# Patient Record
Sex: Female | Born: 1985 | Race: White | Hispanic: No | Marital: Married | Smoking: Never smoker
Health system: Southern US, Community
[De-identification: ages and names within clinical notes are randomized; demographics above are authoritative.]

## PROBLEM LIST (undated history)

## (undated) DIAGNOSIS — M25569 Pain in unspecified knee: Secondary | ICD-10-CM

## (undated) DIAGNOSIS — G8929 Other chronic pain: Secondary | ICD-10-CM

## (undated) DIAGNOSIS — Z808 Family history of malignant neoplasm of other organs or systems: Secondary | ICD-10-CM

## (undated) HISTORY — DX: Other chronic pain: G89.29

## (undated) HISTORY — PX: WISDOM TOOTH EXTRACTION: SHX21

## (undated) HISTORY — DX: Family history of malignant neoplasm of other organs or systems: Z80.8

## (undated) HISTORY — DX: Pain in unspecified knee: M25.569

---

## 2006-06-16 ENCOUNTER — Emergency Department: Payer: Self-pay | Admitting: General Practice

## 2015-07-23 DIAGNOSIS — M419 Scoliosis, unspecified: Secondary | ICD-10-CM | POA: Insufficient documentation

## 2016-08-14 DIAGNOSIS — H5213 Myopia, bilateral: Secondary | ICD-10-CM | POA: Diagnosis not present

## 2016-09-11 ENCOUNTER — Encounter: Payer: Self-pay | Admitting: Osteopathic Medicine

## 2016-09-11 ENCOUNTER — Ambulatory Visit (INDEPENDENT_AMBULATORY_CARE_PROVIDER_SITE_OTHER): Payer: BLUE CROSS/BLUE SHIELD | Admitting: Osteopathic Medicine

## 2016-09-11 VITALS — BP 135/81 | HR 98 | Ht 67.0 in | Wt 148.0 lb

## 2016-09-11 DIAGNOSIS — Z712 Person consulting for explanation of examination or test findings: Secondary | ICD-10-CM

## 2016-09-11 DIAGNOSIS — Z7189 Other specified counseling: Secondary | ICD-10-CM | POA: Diagnosis not present

## 2016-09-11 DIAGNOSIS — M549 Dorsalgia, unspecified: Secondary | ICD-10-CM

## 2016-09-11 DIAGNOSIS — Z3041 Encounter for surveillance of contraceptive pills: Secondary | ICD-10-CM | POA: Diagnosis not present

## 2016-09-11 DIAGNOSIS — L723 Sebaceous cyst: Secondary | ICD-10-CM

## 2016-09-11 DIAGNOSIS — G8929 Other chronic pain: Secondary | ICD-10-CM

## 2016-09-11 MED ORDER — LEVONORGESTREL-ETHINYL ESTRAD 0.1-20 MG-MCG PO TABS: 1.0000 | ORAL_TABLET | Freq: Every day | ORAL | 3 refills | Status: DC

## 2016-09-11 NOTE — Progress Notes (Signed)
HPI: Brittney Mays is a 31 y.o. female  who presents to [facility] today, 09/11/16,  for chief complaint of:  Chief Complaint  Patient presents with  . Establish Care    Back pain: History of scoliosis, previously following at [facility] for orthopedics, integrative care/massage. New to the area. Back pain minimal today, is doing well keeping up with stretches/home exercises.  Lump on neck: Right side just under jawline, has noticed small bump on the skin, tried to squeeze/strain this once in scant flaky material emerged. No redness/pain, no difficulty swallowing.  Has some concerns about some biometric screening from work. Blood pressure was a bit low on one measurement, she states it was done using a portable wrist monitor. Had some questions also about sugar levels and cholesterol, A1c was never in prediabetic range that one fasting blood glucose was slightly over 100.  Requests refill on birth control. Has been on this method many years. Currently engaged and taking about getting pregnant within the next few years, would like to stay on pill   Past medical, surgical, social and family history reviewed: Patient Active Problem List   Diagnosis Date Noted  . Chronic bilateral back pain 09/12/2016   Past Surgical History:  Procedure Laterality Date  . WISDOM TOOTH EXTRACTION     Social History  Substance Use Topics  . Smoking status: Never Smoker  . Smokeless tobacco: Never Used  . Alcohol use Not on file   Family History  Problem Relation Age of Onset  . Hypertension Mother   . Cancer Sister   . Cancer Paternal Uncle   . Diabetes Paternal Uncle   . Depression Maternal Grandmother   . Diabetes Paternal Grandfather      Current medication list and allergy/intolerance information reviewed:   Current Outpatient Prescriptions  Medication Sig Dispense Refill  . levonorgestrel-ethinyl estradiol (LUTERA) 0.1-20 MG-MCG tablet TK 1 T PO QD     . ibuprofen (ADVIL,MOTRIN) 200 MG tablet Take by mouth.     No current facility-administered medications for this visit.    Allergies  Allergen Reactions  . Amoxicillin Hives      Review of Systems:  Constitutional:  No  fever, no chills, No recent illness, No unintentional weight changes. No significant fatigue.   HEENT: No  headache, no vision change, no hearing change, No sore throat, No  sinus pressure  Cardiac: No  chest pain, No  pressure, No palpitations, No  Orthopnea  Respiratory:  No  shortness of breath. No  Cough  Gastrointestinal: No  abdominal pain, No  nausea, No  vomiting,  No  blood in stool, No  diarrhea, No  constipation   Musculoskeletal: No new myalgia/arthralgia  Genitourinary: No  incontinence, No  abnormal genital bleeding, No abnormal genital discharge  Skin: No  Rash, No other wounds/concerning lesions  Hem/Onc: No  easy bruising/bleeding, No  abnormal lymph node  Endocrine: No cold intolerance,  No heat intolerance. No polyuria/polydipsia/polyphagia   Neurologic: No  weakness, No  dizziness, No  slurred speech/focal weakness/facial droop  Psychiatric: No  concerns with depression, No  concerns with anxiety, No sleep problems, No mood problems  Exam:  BP 135/81   Pulse 98   Ht 5\' 7"  (1.702 m)   Wt 148 lb (67.1 kg)   BMI 23.18 kg/m   Constitutional: VS see above. General Appearance: alert, well-developed, well-nourished, NAD  Eyes: Normal lids and conjunctive, non-icteric sclera  Ears, Nose, Mouth, Throat: MMM, Normal external  inspection ears/nares/mouth/lips/gums.  Neck: No masses, trachea midline. No thyroid enlargement. No tenderness/mass appreciated. No lymphadenopathy. See skin exam.   Respiratory: Normal respiratory effort. no wheeze, no rhonchi, no rales  Cardiovascular: S1/S2 normal, no murmur, no rub/gallop auscultated. RRR. No lower extremity edema. Pedal pulse II/IV bilaterally DP and PT. No carotid bruit or JVD. No abdominal  aortic bruit.  Gastrointestinal: Nontender, no masses. No hepatomegaly, no splenomegaly. No hernia appreciated. Bowel sounds normal. Rectal exam deferred.   Musculoskeletal: Gait normal. No clubbing/cyanosis of digits.   Neurological: Normal balance/coordination. No tremor. No cranial nerve deficit on limited exam. Motor and sensation intact and symmetric. Cerebellar reflexes intact.   Skin: warm, dry, intact. No rash/ulcer. No concerning nevi or subq nodules on limited exam. Area of concern on right jawline: Tiny subcutaneous papule/nodule, round, about 1-2 mm diameter, nontender, no overlying erythema or skin irregularity, hair follicle off center  Psychiatric: Normal judgment/insight. Normal mood and affect. Oriented x3.     X-ray thoracic spine AP and lateral5/26/2017 [place] Result Narrative  XR THORACIC SPINE AP AND LATERAL.  Clinical history: pain in thoracic spine, M54.6 Pain in thoracic spine, G89.29 Other chronic pain.  Comparison: None.  Findings and impression: The vertebral body heights, intervertebral disc spaces, and thoracic alignment are well maintained. The visualized paravertebral soft tissues are normal.  Electronically Signed VZ:CHYIFOYD Luiz Blare, MD Electronically Signed on:09/24/2015 9:35 AM      ASSESSMENT/PLAN:   Chronic bilateral back pain, unspecified back location - Follow-up as needed, continue current home exercises. - Plan: ibuprofen (ADVIL,MOTRIN) 200 MG tablet  Sebaceous cyst - Area of concern on right jawline appears benign, small sebaceous cysts. Offered to drain for confirmation versus watchful waiting, patient declines procedure  Encounter to discuss test results - Advised A1c is within normal range, possible inaccurate blood pressure reading with wrist monitor, no symptoms of low BP, reassurance   Encounter for surveillance of contraceptive pills - Plan: levonorgestrel-ethinyl estradiol (LUTERA) 0.1-20 MG-MCG  tablet    Patient Instructions  Plan:  Biometrics/labs at least every year or as directed by your employer. Always ask about any questions/concerns on the blood work! At this point, I have no worries!   Keep an eye on the lump on the skin, this looks like a small benign cyst but if it changes or enlarges, please come see me!   Birth control refill sent, let me know if any problems  For back or knee pain, feel free to come see me or you can also see one of our sports medicine physicians Dr. Darene Lamer or Dr. Georgina Snell.   Plan to come back in one year - you'll be due for Pap at that time. Come see me sooner if needed!     Visit summary with medication list and pertinent instructions was printed for patient to review. All questions at time of visit were answered - patient instructed to contact office with any additional concerns. ER/RTC precautions were reviewed with the patient. Follow-up plan: Return in about 1 year (around 09/11/2017) for [place] / PAP, sooner if needed.  Note: Total time spent 30 minutes, greater than 50% of the visit was spent face-to-face counseling and coordinating care for the following: The primary encounter diagnosis was Chronic bilateral back pain, unspecified back location. Diagnoses of Sebaceous cyst, Encounter to discuss test results, and Encounter for surveillance of contraceptive pills were also pertinent to this visit.Marland Kitchen

## 2016-09-11 NOTE — Patient Instructions (Signed)
Plan:  Biometrics/labs at least every year or as directed by your employer. Always ask about any questions/concerns on the blood work! At this point, I have no worries!   Keep an eye on the lump on the skin, this looks like a small benign cyst but if it changes or enlarges, please come see me!   Birth control refill sent, let me know if any problems  For back or knee pain, feel free to come see me or you can also see one of our sports medicine physicians Dr. Darene Lamer or Dr. Georgina Snell.   Plan to come back in one year - you'll be due for Pap at that time. Come see me sooner if needed!      Thank you for coming in today! You should receive an email asking you to complete a brief survey regarding your experience today at [facility]. Please take a moment to respond to this survey. Our goal is to serve you! Constructive criticism helps [place] to improve, and positive feedback helps our practice to shine, plus it makes [place] smile! Thanks in advance for your feedback.

## 2016-09-12 DIAGNOSIS — G8929 Other chronic pain: Secondary | ICD-10-CM | POA: Insufficient documentation

## 2016-09-12 DIAGNOSIS — M549 Dorsalgia, unspecified: Secondary | ICD-10-CM

## 2016-10-23 ENCOUNTER — Other Ambulatory Visit: Payer: Self-pay | Admitting: *Deleted

## 2016-10-23 DIAGNOSIS — Z3041 Encounter for surveillance of contraceptive pills: Secondary | ICD-10-CM

## 2016-10-25 ENCOUNTER — Other Ambulatory Visit: Payer: Self-pay

## 2016-10-25 DIAGNOSIS — Z3041 Encounter for surveillance of contraceptive pills: Secondary | ICD-10-CM

## 2016-10-25 MED ORDER — LEVONORGESTREL-ETHINYL ESTRAD 0.1-20 MG-MCG PO TABS: 1.0000 | ORAL_TABLET | Freq: Every day | ORAL | 3 refills | Status: DC

## 2017-01-26 ENCOUNTER — Telehealth: Payer: Self-pay | Admitting: Osteopathic Medicine

## 2017-01-26 NOTE — Telephone Encounter (Signed)
I Called the patient she stated that she is going to the [place] so she needs to know what shots sh will need. Farzana Koci,CMA

## 2017-01-26 NOTE — Telephone Encounter (Signed)
Patient is leaving the country around the middle of October. She is unsure of the vaccinations she will need. Please advise. Thanks!

## 2017-01-29 NOTE — Telephone Encounter (Signed)
For anyone traveling out of the country, I encourage them to review the CDC's travel website at http://www.gilbert-cooper.com/ - this should have updated information based on wherever the patient is going.   If they have any further needs or questions, they will need to schedule an appointment for travel consultation and vaccination administration. They can do this with our office, or occupational health downstairs I believe also provides travel consultation appointment for a flat fee plus vaccine costs, and they stock many of the vaccines that we do not - encourage patients to call their office at [phone] .

## 2017-01-29 NOTE — Telephone Encounter (Signed)
LEFT DETAILED MESSAGE ON PATIENT VM WITH THIS INFORMATION AS NOTED BELOW. Rhonda Cunningham,CMA

## 2017-07-10 ENCOUNTER — Telehealth: Payer: Self-pay

## 2017-07-10 NOTE — Telephone Encounter (Signed)
Can stop it whenever she'd like. If stopping early, period may come within a few days of stopping the pill pack.

## 2017-07-10 NOTE — Telephone Encounter (Signed)
At pt's request - left a full detail vm msg regarding provider's note.

## 2017-07-10 NOTE — Telephone Encounter (Signed)
As per pt - she would like to stop using oral contraceptives. She & her partner will be using barrier protection. Pt requesting feed back from provider - can she stop taking OCP once she is done with current pack & does she need to come in for a visit? Pls advise. Thanks.

## 2017-08-27 LAB — BASIC METABOLIC PANEL: Glucose: 84

## 2017-08-27 LAB — LIPID PANEL
CHOLESTEROL: 195 (ref 0–200)
HDL: 69 (ref 35–70)
LDL Cholesterol: 104
LDl/HDL Ratio: 2.8
Triglycerides: 110 (ref 40–160)

## 2017-08-27 LAB — HEMOGLOBIN A1C: HEMOGLOBIN A1C: 5

## 2017-12-24 ENCOUNTER — Encounter: Payer: Self-pay | Admitting: Sports Medicine

## 2017-12-24 ENCOUNTER — Ambulatory Visit (HOSPITAL_BASED_OUTPATIENT_CLINIC_OR_DEPARTMENT_OTHER)
Admission: RE | Admit: 2017-12-24 | Discharge: 2017-12-24 | Disposition: A | Discharge status: Home / Self Care | Payer: BLUE CROSS/BLUE SHIELD | Source: Ambulatory Visit | Attending: Sports Medicine | Admitting: Sports Medicine

## 2017-12-24 ENCOUNTER — Ambulatory Visit: Payer: BLUE CROSS/BLUE SHIELD | Admitting: Sports Medicine

## 2017-12-24 DIAGNOSIS — M26622 Arthralgia of left temporomandibular joint: Secondary | ICD-10-CM

## 2017-12-24 DIAGNOSIS — M26602 Left temporomandibular joint disorder, unspecified: Secondary | ICD-10-CM | POA: Diagnosis not present

## 2017-12-24 MED ORDER — MELOXICAM 15 MG PO TABS: ORAL_TABLET | ORAL | 3 refills | Status: DC

## 2017-12-24 NOTE — Patient Instructions (Signed)
Temporomandibular Joint Syndrome Temporomandibular joint (TMJ) syndrome is a condition that affects the joints between your jaw and your skull. The TMJs are located near your ears and allow your jaw to open and close. These joints and the nearby muscles are involved in all movements of the jaw. People with TMJ syndrome have pain in the area of these joints and muscles. Chewing, biting, or other movements of the jaw can be difficult or painful. TMJ syndrome can be caused by various things. In many cases, the condition is mild and goes away within a few weeks. For some people, the condition can become a long-term problem. What are the causes? Possible causes of TMJ syndrome include:  Grinding your teeth or clenching your jaw. Some people do this when they are under stress.  Arthritis.  Injury to the jaw.  Head or neck injury.  Teeth or dentures that are not aligned well.  In some cases, the cause of TMJ syndrome may not be known. What are the signs or symptoms? The most common symptom is an aching pain on the side of the head in the area of the TMJ. Other symptoms may include:  Pain when moving your jaw, such as when chewing or biting.  Being unable to open your jaw all the way.  Making a clicking sound when you open your mouth.  Headache.  Earache.  Neck or shoulder pain.  How is this diagnosed? Diagnosis can usually be made based on your symptoms, your medical history, and a physical exam. Your health care provider may check the range of motion of your jaw. Imaging tests, such as X-rays or an MRI, are sometimes done. You may need to see your dentist to determine if your teeth and jaw are lined up correctly. How is this treated? TMJ syndrome often goes away on its own. If treatment is needed, the options may include:  Eating soft foods and applying ice or heat.  Medicines to relieve pain or inflammation.  Medicines to relax the muscles.  A splint, bite plate, or mouthpiece  to prevent teeth grinding or jaw clenching.  Relaxation techniques or counseling to help reduce stress.  Transcutaneous electrical nerve stimulation (TENS). This helps to relieve pain by applying an electrical current through the skin.  Acupuncture. This is sometimes helpful to relieve pain.  Jaw surgery. This is rarely needed.  Follow these instructions at home:  Take medicines only as directed by your health care provider.  Eat a soft diet if you are having trouble chewing.  Apply ice to the painful area. ? Put ice in a plastic bag. ? Place a towel between your skin and the bag. ? Leave the ice on for 20 minutes, 2-3 times a day.  Apply a warm compress to the painful area as directed.  Massage your jaw area and perform any jaw stretching exercises as recommended by your health care provider.  If you were given a mouthpiece or bite plate, wear it as directed.  Avoid foods that require a lot of chewing. Do not chew gum.  Keep all follow-up visits as directed by your health care provider. This is important. Contact a health care provider if:  You are having trouble eating.  You have new or worsening symptoms. Get help right away if:  Your jaw locks open or closed. This information is not intended to replace advice given to you by your health care provider. Make sure you discuss any questions you have with your health care provider. Document   Released: 01/10/2001 Document Revised: 12/16/2015 Document Reviewed: 11/20/2013 Elsevier Interactive Patient Education  2018 Elsevier Inc.  

## 2017-12-24 NOTE — Assessment & Plan Note (Signed)
With masseter spasm as well. She will use heat, meloxicam. She will go to the pharmacy and get a mouthguard to wear at night. Jaw x-rays at [facility], x-ray is out of order here. Return to see me in 2 weeks, referral to oral and maxillofacial surgery if no better.

## 2017-12-24 NOTE — Progress Notes (Signed)
Subjective:    CC: Left jaw pain  HPI: For the past couple of days this pleasant 32 year old female has had pain that she localizes on her left jaw just anterior to her ear.  Worse when opening her mouth widely, or shifting it side to side, no trauma, no constitutional symptoms.  Pain is moderate, persistent.  I reviewed the past medical history, family history, social history, surgical history, and allergies today and no changes were needed.  Please see the problem list section below in epic for further details.  Past Medical History: History reviewed. No pertinent past medical history. Past Surgical History: Past Surgical History:  Procedure Laterality Date  . WISDOM TOOTH EXTRACTION     Social History: Social History   Socioeconomic History  . Marital status: Single    Spouse name: Not on file  . Number of children: Not on file  . Years of education: Not on file  . Highest education level: Not on file  Occupational History  . Not on file  Social Needs  . Financial resource strain: Not on file  . Food insecurity:    Worry: Not on file    Inability: Not on file  . Transportation needs:    Medical: Not on file    Non-medical: Not on file  Tobacco Use  . Smoking status: Never Smoker  . Smokeless tobacco: Never Used  Substance and Sexual Activity  . Alcohol use: Not on file  . Drug use: Not on file  . Sexual activity: Not on file  Lifestyle  . Physical activity:    Days per week: Not on file    Minutes per session: Not on file  . Stress: Not on file  Relationships  . Social connections:    Talks on phone: Not on file    Gets together: Not on file    Attends religious service: Not on file    Active member of club or organization: Not on file    Attends meetings of clubs or organizations: Not on file    Relationship status: Not on file  Other Topics Concern  . Not on file  Social History Narrative  . Not on file   Family History: Family History  Problem  Relation Age of Onset  . Hypertension Mother   . Cancer Sister   . Cancer Paternal Uncle   . Diabetes Paternal Uncle   . Depression Maternal Grandmother   . Diabetes Paternal Grandfather    Allergies: Allergies  Allergen Reactions  . Amoxicillin Hives   Medications: See med rec.  Review of Systems: No fevers, chills, night sweats, weight loss, chest pain, or shortness of breath.   Objective:    General: Well Developed, well nourished, and in no acute distress.  Neuro: Alert and oriented x3, extra-ocular muscles intact, sensation grossly intact.  HEENT: Normocephalic, atraumatic, pupils equal round reactive to light, neck supple, no masses, no lymphadenopathy, thyroid nonpalpable.  Tender to palpation over the left temporomandibular joint, reproduction of pain with left and right translation of the jaw.  Ear canals are unremarkable.  Oropharynx is otherwise unremarkable. Skin: Warm and dry, no rashes. Cardiac: Regular rate and rhythm, no murmurs rubs or gallops, no lower extremity edema.  Respiratory: Clear to auscultation bilaterally. Not using accessory muscles, speaking in full sentences.  Impression and Recommendations:    Arthralgia of left temporomandibular joint With masseter spasm as well. She will use heat, meloxicam. She will go to the pharmacy and get a mouthguard to wear  at night. Jaw x-rays at [facility], x-ray is out of order here. Return to see me in 2 weeks, referral to oral and maxillofacial surgery if no better.   ___________________________________________ Gwen Her. Dianah Field, M.D., ABFM., CAQSM. Primary Care and [place] Instructor of [employer] [facility] [employer]

## 2017-12-27 ENCOUNTER — Encounter: Payer: Self-pay | Admitting: Osteopathic Medicine

## 2017-12-27 NOTE — Progress Notes (Signed)
04

## 2018-01-08 ENCOUNTER — Ambulatory Visit: Payer: BLUE CROSS/BLUE SHIELD | Admitting: Sports Medicine

## 2018-01-28 ENCOUNTER — Ambulatory Visit: Payer: BLUE CROSS/BLUE SHIELD | Admitting: Osteopathic Medicine

## 2018-02-12 ENCOUNTER — Ambulatory Visit (INDEPENDENT_AMBULATORY_CARE_PROVIDER_SITE_OTHER): Payer: BLUE CROSS/BLUE SHIELD | Admitting: Osteopathic Medicine

## 2018-02-12 ENCOUNTER — Encounter: Payer: Self-pay | Admitting: Osteopathic Medicine

## 2018-02-12 ENCOUNTER — Ambulatory Visit: Payer: BLUE CROSS/BLUE SHIELD | Admitting: Osteopathic Medicine

## 2018-02-12 VITALS — BP 127/71 | HR 74 | Temp 98.3°F | Wt 146.8 lb

## 2018-02-12 DIAGNOSIS — G8929 Other chronic pain: Secondary | ICD-10-CM

## 2018-02-12 DIAGNOSIS — M549 Dorsalgia, unspecified: Secondary | ICD-10-CM

## 2018-02-12 DIAGNOSIS — F411 Generalized anxiety disorder: Secondary | ICD-10-CM

## 2018-02-12 NOTE — Patient Instructions (Addendum)
Escitalopram tablets What is this medicine? ESCITALOPRAM (es sye TAL oh pram) is used to treat depression and certain types of anxiety. This medicine may be used for other purposes; ask your health care provider or pharmacist if you have questions. COMMON BRAND NAME(S): Lexapro What should I tell my health care provider before I take this medicine? They need to know if you have any of these conditions: -bipolar disorder or a family history of bipolar disorder -diabetes -glaucoma -heart disease -kidney or liver disease -receiving electroconvulsive therapy -seizures (convulsions) -suicidal thoughts, plans, or attempt by you or a family member -an unusual or allergic reaction to escitalopram, the related drug citalopram, other medicines, foods, dyes, or preservatives -pregnant or trying to become pregnant -breast-feeding How should I use this medicine? Take this medicine by mouth with a glass of water. Follow the directions on the prescription label. You can take it with or without food. If it upsets your stomach, take it with food. Take your medicine at regular intervals. Do not take it more often than directed. Do not stop taking this medicine suddenly except upon the advice of your doctor. Stopping this medicine too quickly may cause serious side effects or your condition may worsen. A special MedGuide will be given to you by the pharmacist with each prescription and refill. Be sure to read this information carefully each time. Talk to your pediatrician regarding the use of this medicine in children. Special care may be needed. Overdosage: If you think you have taken too much of this medicine contact a poison control center or emergency room at once. NOTE: This medicine is only for you. Do not share this medicine with others. What if I miss a dose? If you miss a dose, take it as soon as you can. If it is almost time for your next dose, take only that dose. Do not take double or extra  doses.  What should I watch for while using this medicine? Tell your doctor if your symptoms do not get better or if they get worse. Visit your doctor or health care professional for regular checks on your progress. Because it may take several weeks to see the full effects of this medicine, it is important to continue your treatment as prescribed by your doctor. Patients and their families should watch out for new or worsening thoughts of suicide or depression. Also watch out for sudden changes in feelings such as feeling anxious, agitated, panicky, irritable, hostile, aggressive, impulsive, severely restless, overly excited and hyperactive, or not being able to sleep. If this happens, especially at the beginning of treatment or after a change in dose, call your health care professional. Dennis Bast may get drowsy or dizzy. Do not drive, use machinery, or do anything that needs mental alertness until you know how this medicine affects you. Do not stand or sit up quickly, especially if you are an older patient. This reduces the risk of dizzy or fainting spells. Alcohol may interfere with the effect of this medicine. Avoid alcoholic drinks. Your mouth may get dry. Chewing sugarless gum or sucking hard candy, and drinking plenty of water may help. Contact your doctor if the problem does not go away or is severe. What side effects may I notice from receiving this medicine? Side effects that you should report to your doctor or health care professional as soon as possible: -allergic reactions like skin rash, itching or hives, swelling of the face, lips, or tongue -anxious -black, tarry stools -changes in vision -confusion -elevated  mood, decreased need for sleep, racing thoughts, impulsive behavior -eye pain -fast, irregular heartbeat -feeling faint or lightheaded, falls -feeling agitated, angry, or irritable -hallucination, loss of contact with reality -loss of balance or coordination -loss of  memory -painful or prolonged erections -restlessness, pacing, inability to keep still -seizures -stiff muscles -suicidal thoughts or other mood changes -trouble sleeping -unusual bleeding or bruising -unusually weak or tired -vomiting Side effects that usually do not require medical attention (report to your doctor or health care professional if they continue or are bothersome): -changes in appetite -change in sex drive or performance -headache -increased sweating -indigestion, nausea -tremors This list may not describe all possible side effects. Call your doctor for medical advice about side effects. You may report side effects to FDA at 1-800-FDA-1088. Where should I keep my medicine? Keep out of reach of children. Store at room temperature between 15 and 30 degrees C (59 and 86 degrees F). Throw away any unused medicine after the expiration date. NOTE: This sheet is a summary. It may not cover all possible information. If you have questions about this medicine, talk to your doctor, pharmacist, or health care provider.     2018 Elsevier/Gold Standard (2015-09-20 13:20:23)

## 2018-02-12 NOTE — Progress Notes (Signed)
HPI: Brittney Mays is a 32 y.o. female who  has no past medical history on file.  she presents to [facility] today, 02/12/18,  for chief complaint of:  Anxiety  Reports she has always been a relatively anxious person but over the past couple of months is noticing more irritability and insomnia which is affecting her quality of life.  Work is stressful, she got married last year and still is adjusting a little bit.  Otherwise relationships are good, no major changes or other stressors.  Has some questions about medications/counseling, etc.  Also had some question about chronic back pain issues, she has tried physical therapy, massage, NSAID, home exercises, still coming and going.  No major injury lately or serious exacerbation lately   Depression screen [facility] 2/9 02/12/2018  Decreased Interest 0  Down, Depressed, Hopeless 0  PHQ - 2 Score 0  Altered sleeping 1  Tired, decreased energy 0  Change in appetite 0  Feeling bad or failure about yourself  0  Trouble concentrating 1  Moving slowly or fidgety/restless 0  Suicidal thoughts 0  PHQ-9 Score 2  Difficult doing work/chores Not difficult at all   GAD 7 : Generalized Anxiety Score 02/12/2018  Nervous, Anxious, on Edge 1  Control/stop worrying 1  Worry too much - different things 1  Trouble relaxing 1  Restless 0  Easily annoyed or irritable 1  Afraid - awful might happen 0  Total GAD 7 Score 5  Anxiety Difficulty Somewhat difficult        Past medical history, surgical history, and family history reviewed.  Current medication list and allergy/intolerance information reviewed.   (See remainder of HPI, ROS, Phys Exam below)  No results found.  No results found for this or any previous visit (from the past 72 hour(s)).   ASSESSMENT/PLAN:   Anxiety state - Extensive discussion on risks/benefits of medications including SSRIs/benzo, advised counseling.  Patient will think about  medicines,  she was advised to call or send [place] a my chart message in the next week/whenever she makes a decision.  I also sent a reminder in the chart to call if we have not heard from her in a week - Plan: Ambulatory referral to [facility] bilateral back pain, unspecified back location - Has tried physical therapy, has refills of anti-inflammatories, advised to follow-up with sports medicine as needed     Follow-up plan: Return for recheck if decide to start medicines - 4-6 weeks .            ############################################ ############################################ ############################################ ############################################    Outpatient Encounter Medications as of 02/12/2018  Medication Sig  . meloxicam (MOBIC) 15 MG tablet One tab PO qAM with breakfast for 2 weeks, then daily prn pain.  Marland Kitchen levonorgestrel-ethinyl estradiol (LUTERA) 0.1-20 MG-MCG tablet Take 1 tablet by mouth daily. (Patient not taking: Reported on 02/12/2018)   No facility-administered encounter medications on file as of 02/12/2018.    Allergies  Allergen Reactions  . Amoxicillin Hives      Review of Systems:  Constitutional: No recent illness  Cardiac: No  chest pain, No  pressure  Respiratory:  No  shortness of breath.   Gastrointestinal: No  abdominal pain, no change on bowel habits  Musculoskeletal: No new myalgia/arthralgia  Neurologic: No  weakness, No  Dizziness  Psychiatric: No  concerns with depression, No  concerns with anxiety  Exam:  BP 127/71 (BP Location: Left Arm, Patient Position: Sitting,  Cuff Size: Normal)   Pulse 74   Temp 98.3 F (36.8 C) (Oral)   Wt 146 lb 12.8 oz (66.6 kg)   BMI 22.99 kg/m   Constitutional: VS see above. General Appearance: alert, well-developed, well-nourished, NAD  Eyes: Normal lids and conjunctive, non-icteric sclera  Ears, Nose, Mouth, Throat: MMM, Normal external inspection  ears/nares/mouth/lips/gums.  Neck: No masses, trachea midline.   Respiratory: Normal respiratory effort.  Musculoskeletal: Gait normal. Symmetric and independent movement of all extremities  Neurological: Normal balance/coordination. No tremor.  Skin: warm, dry, intact.   Psychiatric: Normal judgment/insight. Normal mood and affect. Oriented x3. Tearful on interview when discussing   Visit summary with medication list and pertinent instructions was printed for patient to review, advised to alert [place] if any changes needed. All questions at time of visit were answered - patient instructed to contact office with any additional concerns. ER/RTC precautions were reviewed with the patient and understanding verbalized.   Follow-up plan: Return for recheck if decide to start medicines - 4-6 weeks .  Note: Total time spent 25 minutes, greater than 50% of the visit was spent face-to-face counseling and coordinating care for the following: The primary encounter diagnosis was Anxiety state. A diagnosis of Chronic bilateral back pain, unspecified back location was also pertinent to this visit.Marland Kitchen  Please note: voice recognition software was used to produce this document, and typos may escape review. Please contact Dr. Sheppard Coil for any needed clarifications.

## 2018-03-15 ENCOUNTER — Ambulatory Visit (INDEPENDENT_AMBULATORY_CARE_PROVIDER_SITE_OTHER): Payer: BLUE CROSS/BLUE SHIELD | Admitting: Licensed Clinical Social Worker

## 2018-03-15 DIAGNOSIS — F411 Generalized anxiety disorder: Secondary | ICD-10-CM | POA: Diagnosis not present
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
## 2018-04-03 ENCOUNTER — Ambulatory Visit (HOSPITAL_COMMUNITY): Payer: BLUE CROSS/BLUE SHIELD | Admitting: Licensed Clinical Social Worker

## 2018-04-04 ENCOUNTER — Ambulatory Visit (INDEPENDENT_AMBULATORY_CARE_PROVIDER_SITE_OTHER): Payer: BLUE CROSS/BLUE SHIELD | Admitting: Licensed Clinical Social Worker

## 2018-04-04 DIAGNOSIS — F411 Generalized anxiety disorder: Secondary | ICD-10-CM

## 2018-04-04 NOTE — Progress Notes (Addendum)
THERAPIST PROGRESS NOTE  Session Time: 2:05 PM to 3:00 PM  Participation Level: Active  Behavioral Response: CasualAlertEuthymic and and tearful in discussing specific topics  Type of Therapy: Individual Therapy  Treatment Goals addressed:  decrease anxiety, coping  Interventions: CBT, Solution Focused, Strength-based, Supportive and Other: coping  Summary: Brittney Mays is a 32 y.o. female who presents with haven't slept well and impacts her day, has to have wisdom teeth removed.  Therapist presented cognitive behavioral therapy model to explain how thoughts such as negative forecasting, over estimating negative outcome and under estimating their ability to cope, and behaviors such as lack of problem-solving skills perpetuate cycle, thinking and doing differently will help with anxiety.  Identified patient's worry about money as a type worry about the future  things that might happen or could happen, staying more in present, living life is a daily process helps to stay out thoughts that are future oriented.  Discussed being able to tolerate and accept uncertainty about things helps with coping as it is a part of life, that we all have to learn to cope with, letting go of the need to control things, recognizing what is and what is not in our control. Being able to recognize there is not absolute certainty in our choices and only can make the best choice possible.  Introduced worksheet "ruminating problem-solving that has gone wrong" to explain that thinking through things in her head can lead to not taking action steps, the thinking process begins to not be helpful problem solving skills which involve action, discussed limiting time to think of a solution, and most effective strategy is then to take action and adjust as needed with plan.  Discussed distraction is helpful for anxiety and patient became tearful relating she does not have any hobbies, reminiscing on long periods of time where  she did not have the time for it area discussed this as very important in her treatment plan as well for overall well-being in life and patient plans to join class for ornithology.    Therapist to assess patient current functioning per report.  Introduced CBT model to help patient understand how thoughts and behaviors create anxiety and how they keep cycle of anxiety going.  Discussed how thinking differently, not negatively projecting into the future, excepting uncertainty in life, doing differently such as problem-solving, mindfulness and living in present moment are helpful.  Worked on building insight that anxiety is not helpful it is an attempt to solve problems but does not do anything to solve the problem, leads to negative emotion and more helpful to be active and problem-solving it is more effective. Discussed another strategy is to concentrate on one day at a time, because that is all were guaranteed in life.  Worrying does not change a thing, think about life as a daily process and not a future of projected possibilities that may happen it will help her cope.  One day at a time, one problem at a time, one solution at a time then one stays in the present and able to enjoy but is happening in the present.  Discussed rumination as part of patient's anxiety and thinking about the problem and not acting on it related to work where she feels paralyzed, related a more helpful strategy is to spend a little time and thinking about problem-solving but important to get herself into action, she can adjust as she goes along. Discussed distraction is helpful for patient help her get away from rumination  and anxiety, discovered patient does not have any hobbies discussed her getting involved in class about learning about birds is positive step in part of helpful and working on treatment goals.  Provided strength based and supportive intervention Suicidal/Homicidal: No  Plan: Return again in 2 weeks.2.  Therapist  continue to work with patient on strategies to decrease to anxiety. 3.  Patient to sign up for class on or nephrology to engage in activities as hobbies and when she will enjoy  Diagnosis: Axis I:  generalized anxiety disorder    Axis II: No diagnosis    Cordella Register, LCSW 04/04/2018

## 2018-04-17 ENCOUNTER — Ambulatory Visit (INDEPENDENT_AMBULATORY_CARE_PROVIDER_SITE_OTHER): Payer: BLUE CROSS/BLUE SHIELD | Admitting: Licensed Clinical Social Worker

## 2018-04-17 DIAGNOSIS — F411 Generalized anxiety disorder: Secondary | ICD-10-CM | POA: Diagnosis not present

## 2018-04-17 NOTE — Progress Notes (Signed)
THERAPIST PROGRESS NOTE  Session Time: 3:02 PM to 4:00 PM  Participation Level: Active  Behavioral Response: CasualAlertEuthymic  Type of Therapy: Individual Therapy  Treatment Goals addressed:  decrease anxiety, coping  Interventions: CBT, Solution Focused, Strength-based, Supportive, Reframing and Other: relaxation-deep breathing, coping  Summary: Brittney Mays is a 32 y.o. female who check in and relates had wisdom teeth removed right after last session and since last time mostly dealing with this, still dealing with cheek torn up, still bleeding in mouth, being wiped out for awhile afterwards. What happened is it pushed her behind on things at work. Her plan had been that work would become less busy and it hasn't turned out that way because of losing time and influx of clients. Also this combined with holidays and getting together with family. Shares if somebody was watching her at work they would see her mind going a mile a minute, she would start to do something and then do something else, going in three different directions. Reviewed worksheet "Generalized Anxiety Disorder" patient related able to prioritize and get test done but is very stressed out and tense doing these tasks. 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 Relates some days there is pressure because of the volume and the need to keep mom and baby safe. Other days feels pressure because of her own expectation or other's expectation on herself. Acknowledges setting high standards, perfectionism and having been that way for awhile.and didn't get the break.discussed perfectionist thinking is distorted as nothing is perfect, can interfere with more effective functioning.  Reviewed strategies to help with anxiety, introduced deep breathing, reviewed activities she does to relax and  discussed relaxation as foundation for addressing anxiety. She walks dog outside and has a yoga class once a week. Discussed finding and doing relaxation strategy every day.  Provided positive feedback with patient saying thinking about what her expectations would be for someone else and then applying to her self as a way to challenge perfectionism, therapist also encouraged her just that she would be a friend to be a good support herself instead of being critical and demanding, this helps with coping.  Patient shares she about a book on generalized anxiety disorder and therapist related this will be helpful making progress and developing coping strategies for anxiety.  Shares holidays will be different for her this year, will be with family but in-laws, relates father in-law is paralyzed from chest down, in-laws have not coped well with this so not sure what they are walking into.   Therapist assessed patient current functioning per report.  Reviewed worksheet "generalized anxiety" to explore how patient specifically experiences anxiety in different situations.  Identified patient can engage in problem-solving but anxiety remains very high.  Identified patient is perfectionistic and discussed that perfectionism is unrealistic and unhelpful, work in therapy on ways to talk to herself that will be more helpful to help not increase anxiety.  Discussed treatment to include being less focused on uncertainty and more present focus, relaxation techniques, techniques for dealing with unhelpful beliefs and pointed out for example perfectionist thinking.  Identified patient's job is stressful but also discussed finding ways to do job more effectively with less anxiety.  Identified relaxation as important for step in managing anxiety, identified two activities she can do a week for relaxation also  encouraged her in daily relaxation activities.  Discussed finding the right one for patient, introduced deep breathing and  provided patient with a worksheet to learn more into detail and instructions for focused breathing.  Reviewed stressors for upcoming holiday, reframes and ways to see where she may have some positive experiences.  Provided strength based and supportive intervention  Suicidal/Homicidal: No  Plan: Return again in 2 weeks.2.  Purpose continue to work with patient on strategies to help decrease anxiety. 3.  Patient to read handout on "focused breathing" and begin bulk she has found tied will "generalized anxiety disorder"  Diagnosis: Axis I:   generalized anxiety disorder    Axis II: No diagnosis    Cordella Register, LCSW 04/17/2018

## 2018-05-06 ENCOUNTER — Ambulatory Visit (INDEPENDENT_AMBULATORY_CARE_PROVIDER_SITE_OTHER): Payer: BLUE CROSS/BLUE SHIELD | Admitting: Family Medicine

## 2018-05-06 VITALS — BP 125/84 | HR 103 | Temp 101.2°F | Ht 67.0 in | Wt 153.0 lb

## 2018-05-06 DIAGNOSIS — R059 Cough, unspecified: Secondary | ICD-10-CM

## 2018-05-06 DIAGNOSIS — J101 Influenza due to other identified influenza virus with other respiratory manifestations: Secondary | ICD-10-CM

## 2018-05-06 DIAGNOSIS — R05 Cough: Secondary | ICD-10-CM

## 2018-05-06 LAB — POCT INFLUENZA A/B
Influenza A, POC: NEGATIVE
Influenza B, POC: POSITIVE — AB

## 2018-05-06 LAB — POCT RAPID STREP A (OFFICE): RAPID STREP A SCREEN: NEGATIVE

## 2018-05-06 MED ORDER — BENZONATATE 200 MG PO CAPS: 200.0000 mg | ORAL_CAPSULE | Freq: Three times a day (TID) | ORAL | 0 refills | Status: DC | PRN

## 2018-05-06 NOTE — Patient Instructions (Addendum)
Thank you for coming in today.  Please maximize the over the counter medicine.  The maximum dose of acetaminophen is 1000 mg every 6 hours. The maximum dose of ibuprofen is 800 mg every 8 hours.  Make sure to look at the ingredients of the medications you take to not take too much of either 1 of these medicines but you can certainly increase what you are taking now.  If you start having a bad cough fill and start taking the tessalon pearles for cough. The medicine will not make you sleepy.  If not controlled I can prescribe codeine based cough medicine.   If sore throat continues after you stop having a fever and feel better we can use prednisone. This is a strong antiinflammatory medicine.   Call or go to the emergency room if you get worse, have trouble breathing, have chest pains, or palpitations.    Influenza, Adult Influenza, more commonly known as "the flu," is a viral infection that mainly affects the respiratory tract. The respiratory tract includes organs that help you breathe, such as the lungs, nose, and throat. The flu causes many symptoms similar to the common cold along with high fever and body aches. The flu spreads easily from person to person (is contagious). Getting a flu shot (influenza vaccination) every year is the best way to prevent the flu. What are the causes? This condition is caused by the influenza virus. You can get the virus by:  Breathing in droplets that are in the air from an infected person's cough or sneeze.  Touching something that has been exposed to the virus (has been contaminated) and then touching your mouth, nose, or eyes. What increases the risk? The following factors may make you more likely to get the flu:  Not washing or sanitizing your hands often.  Having close contact with many people during cold and flu season.  Touching your mouth, eyes, or nose without first washing or sanitizing your hands.  Not getting a yearly (annual) flu  shot. You may have a higher risk for the flu, including serious problems such as a lung infection (pneumonia), if you:  Are older than 65.  Are pregnant.  Have a weakened disease-fighting system (immune system). You may have a weakened immune system if you: ? Have HIV or AIDS. ? Are undergoing chemotherapy. ? Are taking medicines that reduce (suppress) the activity of your immune system.  Have a long-term (chronic) illness, such as heart disease, kidney disease, diabetes, or lung disease.  Have a liver disorder.  Are severely overweight (morbidly obese).  Have anemia. This is a condition that affects your red blood cells.  Have asthma. What are the signs or symptoms? Symptoms of this condition usually begin suddenly and last 4-14 days. They may include:  Fever and chills.  Headaches, body aches, or muscle aches.  Sore throat.  Cough.  Runny or stuffy (congested) nose.  Chest discomfort.  Poor appetite.  Weakness or fatigue.  Dizziness.  Nausea or vomiting. How is this diagnosed? This condition may be diagnosed based on:  Your symptoms and medical history.  A physical exam.  Swabbing your nose or throat and testing the fluid for the influenza virus. How is this treated? If the flu is diagnosed early, you can be treated with medicine that can help reduce how severe the illness is and how long it lasts (antiviral medicine). This may be given by mouth (orally) or through an IV. Taking care of yourself at home can  help relieve symptoms. Your health care provider may recommend:  Taking over-the-counter medicines.  Drinking plenty of fluids. In many cases, the flu goes away on its own. If you have severe symptoms or complications, you may be treated in a hospital. Follow these instructions at home: Activity  Rest as needed and get plenty of sleep.  Stay home from work or school as told by your health care provider. Unless you are visiting your health care  provider, avoid leaving home until your fever has been gone for 24 hours without taking medicine. Eating and drinking  Take an oral rehydration solution (ORS). This is a drink that is sold at pharmacies and retail stores.  Drink enough fluid to keep your urine pale yellow.  Drink clear fluids in small amounts as you are able. Clear fluids include water, ice chips, diluted fruit juice, and low-calorie sports drinks.  Eat bland, easy-to-digest foods in small amounts as you are able. These foods include bananas, applesauce, rice, lean meats, toast, and crackers.  Avoid drinking fluids that contain a lot of sugar or caffeine, such as energy drinks, regular sports drinks, and soda.  Avoid alcohol.  Avoid spicy or fatty foods. General instructions      Take over-the-counter and prescription medicines only as told by your health care provider.  Use a cool mist humidifier to add humidity to the air in your home. This can make it easier to breathe.  Cover your mouth and nose when you cough or sneeze.  Wash your hands with soap and water often, especially after you cough or sneeze. If soap and water are not available, use alcohol-based hand sanitizer.  Keep all follow-up visits as told by your health care provider. This is important. How is this prevented?   Get an annual flu shot. You may get the flu shot in late summer, fall, or winter. Ask your health care provider when you should get your flu shot.  Avoid contact with people who are sick during cold and flu season. This is generally fall and winter. Contact a health care provider if:  You develop new symptoms.  You have: ? Chest pain. ? Diarrhea. ? A fever.  Your cough gets worse.  You produce more mucus.  You feel nauseous or you vomit. Get help right away if:  You develop shortness of breath or difficulty breathing.  Your skin or nails turn a bluish color.  You have severe pain or stiffness in your neck.  You  develop a sudden headache or sudden pain in your face or ear.  You cannot eat or drink without vomiting. Summary  Influenza, more commonly known as "the flu," is a viral infection that primarily affects your respiratory tract.  Symptoms of the flu usually begin suddenly and last 4-14 days.  Getting an annual flu shot is the best way to prevent getting the flu.  Stay home from work or school as told by your health care provider. Unless you are visiting your health care provider, avoid leaving home until your fever has been gone for 24 hours without taking medicine.  Keep all follow-up visits as told by your health care provider. This is important. This information is not intended to replace advice given to you by your health care provider. Make sure you discuss any questions you have with your health care provider. Document Released: 04/14/2000 Document Revised: 10/03/2017 Document Reviewed: 10/03/2017 Elsevier Interactive Patient Education  2019 Else[employer]   Laryngitis  Laryngitis is inflammation of the  vocal cords that causes symptoms such as hoarseness or loss of voice. The vocal cords are two bands of muscles in your throat. When you speak, these cords come together and vibrate. The vibrations come out through your mouth as sound. When your vocal cords are inflamed, your voice sounds different. Laryngitis can be temporary (acute) or long-term (chronic). Most cases of acute laryngitis improve with time. Chronic laryngitis is laryngitis that lasts for more than 3 weeks. What are the causes? Acute laryngitis may be caused by:  A viral infection.  Lots of talking, yelling, or singing. This is also called vocal strain.  A bacterial infection. Chronic laryngitis may be caused by:  Vocal strain.  Injury to your vocal cords.  Acid reflux (gastroesophageal reflux disease, or GERD).  Allergies.  A sinus infection.  Smoking.  Alcohol abuse.  Breathing in chemicals or  dust.  Growths on the vocal cords. What increases the risk? The following factors may make you more likely to develop this condition:  Smoking.  Alcohol abuse.  Having allergies.  Chronic irritants in the workplace, such as toxic fumes. What are the signs or symptoms? Symptoms of this condition may include:  Low, hoarse voice.  Loss of voice.  Dry cough.  Sore or dry throat.  Stuffy nose. How is this diagnosed? This condition may be diagnosed based on:  Your symptoms and a physical exam.  Throat culture.  Blood test.  A procedure in which your health care provider looks at your vocal cords with a mirror or viewing tube (laryngoscopy). How is this treated? Treatment for laryngitis depends on what is causing it.  Usually, treatment involves resting your voice and using medicines to soothe your throat.  If your laryngitis is caused by a bacterial infection, you may need to take antibiotic medicine.  If your laryngitis is caused by a growth, you may need to have a procedure to remove it. Follow these instructions at home: Medicines  Take over-the-counter and prescription medicines only as told by your health care provider.  If you were prescribed an antibiotic medicine, take it as told by your health care provider. Do not stop taking the antibiotic even if you start to feel better. General instructions  Talk as little as possible. Also avoid whispering, which can cause vocal strain.  Write instead of talking. Do this until your voice is back to normal.  Drink enough fluid to keep your urine pale yellow.  Breathe in moist air. Use a humidifier if you live in a dry climate.  Do not use any products that contain nicotine or tobacco, such as cigarettes and e-cigarettes. If you need help quitting, ask your health care provider. Contact a health care provider if:  You have a fever.  You have increasing pain.  Your symptoms do not get better in 2 weeks. Get  help right away if:  You cough up blood.  You have difficulty swallowing.  You have trouble breathing. Summary  Laryngitis is inflammation of the vocal cords that causes symptoms such as hoarseness or loss of voice.  Laryngitis can be temporary (acute) or long-term (chronic).  Treatment for laryngitis depends on the cause. It often involves resting your voice and using medicine to soothe your throat. This information is not intended to replace advice given to you by your health care provider. Make sure you discuss any questions you have with your health care provider. Document Released: 04/17/2005 Document Revised: 04/04/2017 Document Reviewed: 04/04/2017 Elsevier Interactive Patient Education  2019  [employer].

## 2018-05-06 NOTE — Progress Notes (Signed)
Brittney Mays is a 33 y.o. female who presents to [facility] today for cough and fever. Brittney Mays started having chills and body aches on Friday (1/3). She subsequently developed fever (100 F measured at home), sore throat, hoarse voice, runny nose, congestion, and difficulty breathing. She has only occasional cough. She has been taking Dayquil which has brought her temperature down. Denies abdominal pain, N/V, diarrhea, and skin rash. She has been traveling a lot over Christmas, so may have been around sick contacts. She did recevie the flu shot this season.    ROS as above:  Exam:  BP 125/84   Pulse (!) 103   Temp (!) 101.2 F (38.4 C) (Oral)   Ht 5\' 7"  (1.702 m)   Wt 153 lb (69.4 kg)   SpO2 100%   BMI 23.96 kg/m  Wt Readings from Last 5 Encounters:  05/06/18 153 lb (69.4 kg)  02/12/18 146 lb 12.8 oz (66.6 kg)  12/24/17 152 lb (68.9 kg)  09/11/16 148 lb (67.1 kg)    Gen: Appears that she is feeling unwell; only able to whisper due to hoarse voice.  Nontoxic appearing HEENT: EOMI,  MMM, erythematous oropharynx. TM NL BL.  Mild bilateral cervical lymphadenopathy Lungs: Normal work of breathing. CTABL Heart: RRR no MRG Abd: NABS, Soft. Nondistended, Nontender Exts: Brisk capillary refill, warm and well perfused.   Lab and Radiology Results Results for orders placed or performed in visit on 05/06/18 (from the past 72 hour(s))  POCT rapid strep A     Status: None   Collection Time: 05/06/18  9:28 AM  Result Value Ref Range   Rapid Strep A Screen Negative Negative  POCT Influenza A/B     Status: Abnormal   Collection Time: 05/06/18  9:28 AM  Result Value Ref Range   Influenza A, POC Negative Negative   Influenza B, POC Positive (A) Negative     Assessment and Plan: 33 y.o. female with  Flu: Brittney Mays has had body aches/chills, fever, sore  throat, hoarse voice, and runny nose since Friday. Her flu test returned positive for Influenza B, and strep test was negative. As it has been over 48 hours since the onset of her symptoms, will continue with symptomatic treatment. Discussed with patient the maximum dose of acetaminophen and alternating with ibuprofen for control of fever and pain. Prescribed tessalon perles to be used if her cough worsens. Gave prescription of prednisone to be filled if she continues having sore throat after fever resolves. Can return to work 24 hours after fever is gone.   PDMP reviewed during this encounter. Orders Placed This Encounter  Procedures  . POCT rapid strep A  . POCT Influenza A/B   Meds ordered this encounter  Medications  . benzonatate (TESSALON) 200 MG capsule    Sig: Take 1 capsule (200 mg total) by mouth 3 (three) times daily as needed for cough.    Dispense:  45 capsule    Refill:  0     Historical information moved to improve visibility of documentation.  No past medical history on file. Past Surgical History:  Procedure Laterality Date  . WISDOM TOOTH EXTRACTION     Social History   Tobacco Use  . Smoking status: Never Smoker  . Smokeless tobacco: Never Used  Substance Use Topics  . Alcohol use: Not on file   family history includes Cancer in her paternal uncle and sister; Depression  in her maternal grandmother; Diabetes in her paternal grandfather and paternal uncle; Hypertension in her mother.  Medications: Current Outpatient Medications  Medication Sig Dispense Refill  . meloxicam (MOBIC) 15 MG tablet One tab PO qAM with breakfast for 2 weeks, then daily prn pain. 30 tablet 3  . benzonatate (TESSALON) 200 MG capsule Take 1 capsule (200 mg total) by mouth 3 (three) times daily as needed for cough. 45 capsule 0   No current facility-administered medications for this visit.    Allergies  Allergen Reactions  . Amoxicillin Hives     Discussed warning signs or  symptoms. Please see discharge instructions. Patient expresses understanding.  I personally was present and performed or re-performed the history, physical exam and medical decision-making activities of this service and have verified that the service and findings are accurately documented in the student's note. ___________________________________________ Lynne Leader M.D., ABFM., CAQSM. Primary Care and Sports Medicine Adjunct Instructor of [employer]

## 2018-05-16 ENCOUNTER — Ambulatory Visit (INDEPENDENT_AMBULATORY_CARE_PROVIDER_SITE_OTHER): Payer: BLUE CROSS/BLUE SHIELD | Admitting: Licensed Clinical Social Worker

## 2018-05-16 DIAGNOSIS — F411 Generalized anxiety disorder: Secondary | ICD-10-CM | POA: Diagnosis not present

## 2018-05-16 NOTE — Progress Notes (Signed)
THERAPIST PROGRESS NOTE  Session Time: 2:05 PM to 3:00 PM  Participation Level: Active  Behavioral Response: CasualAlertEuthymic  Type of Therapy: Individual Therapy  Treatment Goals addressed:   decrease anxiety, coping Interventions: CBT, Solution Focused, Strength-based, Supportive and Reframing  Summary: Brittney Mays is a 33 y.o. female who presents with reflecting on situations that cause her anxiety and realizing that she doesn't have anxiety around situations she cannot control, when she can control the outcome and direct things the way she thinks would be good is when her anxiety is triggered.  She knows at an intellectual level that light is not in her control but does not help her in changing her approach.  Has been told she is analytical and thinks that plays a part in anxiety as she thinks she can always find the best solution and this drives her approach even though she knows that you can always figure things out, but you cannot always come up with best solution needs help from others.  It is this week with catching up from work she is getting practice with how she approaches things. Shared book she is reading titled "Take Control of your Anxiety" shared insights including anxiety involves looking at 2 things both of threat and investment.  Book explained that anxiety is created by perception of your anxiety and not situation itself.  Discussed working on changing and adjusting her perspective, that will help in decrease of anxiety.  Her recognize what is her perception versus reality where are the distortion.  This pointed out patient's perfectionist thinking and again challenged that, lower expectations to ones that are more realistic and where she feels less pressure on herself.  Rising mistakes are part of process and learning experiences.  Explored patient's greatest fear that will help her in taking away the power of the anxiety.  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 Explored messages from the experience that may apply such as missing something will mean she did it wrong.  Discussed another way to look at it as your reaction gets edged and more likely to become Default reaction and have to find a way to change Default reaction.  Patient shares has never been able to do it test her best ability, has been a very successful approach for her compliments and why may be hard to let go of.  Gust finding an attitude that will work for her that is more accurate.  Dated she is getting important trouble with that perspective and patient acknowledges anxiety impacts her quality of life and causing physical symptoms such as  TMJ, crack tooth, muscle pain.  Assess patient current functioning per report.  Explored reasons for patient's escalated anxiety and recognize patient not anxious in situations where she cannot control but where she feels she has control and can direct things this causes anxiety.  Reviewed reasons for this patient relates that has been a approach that has worked for her, lot of the excess classes in her life have been doing jobs even beyond what is required and not approaching things with not putting much effort into it.  Therapist discussed that however she is now running into problems both physically and mentally that indicate adjustment in approach.  Discussed reality is our perspective of it and that patient's perspective has gotten off track.  Explored and discussed how early life experiences can be  internalized and that causes distortions and perspective.  Looked at different approaches that would be helpful for patient including realizing things are not as significant as she imagines, and other words not need to put as much investment into something, having realistic expectations of herself and realizing doing her best is a  more realistic standard, see that mistakes lead to learning opportunities.  Therapist related limits limits for ourselves as part of self management, learning to set limits through come stances where we can only do so much, and then realizing doing your best is a good enough limit.  Also looking wisely at situations to realize how much effort needs to be put in situation so one self manages for optimal functioning divided strength based and supportive intervention Suicidal/Homicidal: No  Plan: Return again in 2 weeks.Marland Kitchen2.Patient continue to work on book "Take Control of Your anxiety". 3.  Therapist continue to work with patient on strategies to decrease anxiety  Diagnosis: Axis I:  generalized anxiety disorder    Axis II: No diagnosis    Cordella Register, LCSW 05/16/2018

## 2018-06-04 ENCOUNTER — Ambulatory Visit (INDEPENDENT_AMBULATORY_CARE_PROVIDER_SITE_OTHER): Payer: BLUE CROSS/BLUE SHIELD | Admitting: Licensed Clinical Social Worker

## 2018-06-04 DIAGNOSIS — F411 Generalized anxiety disorder: Secondary | ICD-10-CM

## 2018-06-04 NOTE — Progress Notes (Addendum)
THERAPIST PROGRESS NOTE  Session Time: 9:03 AM to 9: 58 AM  Participation Level: Active  Behavioral Response: CasualAlertEuthymic  Type of Therapy: Individual Therapy  Treatment Goals addressed:  decrease anxiety, copingInterventions: CBT, Solution Focused, Strength-based, Supportive and Reframing  Summary: Brittney Mays is a 33 y.o. female who presents with working on exercises from book "Take Control of your anxiety. Exercises included "Perception vs Reality" and "Investment and Threat". Reviewed a situation causing anxiety when she is driving in heavy traffic and getting really tense. Relates that mom is a tense driver, imagined the worst case scenario, has lost a loved one to an accident and almost lost another. Also culturally raised that beeping horn is not something you do, that one drives cautiously and and not to inconvenience another. Relates in a car she doesn't have a chance to fix something if she did something wrong. Shares her anxiety rated to not knowing what to do if she had an accident, doing something and making it worse.  She challenged this thought by related that she can trust that there will be other people who know what to do.  Therapist pointed out as well realizing she worried needlessly but result was far different than she expected, in other words recognizing our perspectives are not facts.  Also not to base her actions on others feedback as reasons the often or distorted and based on their own issues but distortions and perspective Related fear to work. Patient shares that her expectation on herself is that she aims for feeling that all work is done, all done on time and completely.  Discussed easing off of expectations of herself.  But patient is challenged by knowing when she is done enough.  Work with patient in session on what is a realistic expectation again challenging ones that are built from perfectionist thinking.  Work with patient on process thinking and  recognizing tasks need to be completed over time often help with realistic expectation.  Worked on patient's checklist recognizing why sometimes things can cannot be checked off as tasks require more steps a more time to be completed.  Reviewed session and patient relates what stood out for her was recognizing another driver is not a valid critique of herself, not basing her performance on outside source when this source is unreliable.   Therapist assess patient current functioning per report and work with patient on on exercises from book "Taking Control of your Anxiety". Patient reviewed as her size on perception versus reality and identified situations where she worried needlessly to help her in challenging her perspective that it is always accurate, but rather realities based on her perspective.  Reviewed activities titled "investment versus threat" and reviewed underlying themes for patient's fear.  1 was patient's anxiety in driving situations, her expectation that she has to know what to do in situation, and also the trigger of horns.  Worked on challenging patients perspectives with patient's own recognition that there will be peple to help so she doesn't always have to know what to do, further not to utilize other people's responses critique for how well she is doing.  Discussed how it applies to work that just like driving situations and having an accident that she doesn't have to know everything the same applies to work, she can lower expectations of herself at work.  Reviewed what is realistic and therapist discussed when patient makes a checklist realization that everything on the list may not get down, even completing one task  may be a process, helping her with more realistic expectation in accomplishing tasks.  Often things are accomplished in a set of stages and more positively evaluating taking steps to start working on project.  Assess focus in working with patient on developing and effectively  applying realistic expectations on herself and understanding what are realistic expectations for herself.  Assess need to continue to work on perfectionist type thinking.  Provided supportive and strength-based intervention  Suicidal/Homicidal: No  Plan: Return again in 2 weeks.2.  Work with patient on exercises and book "taking control of your anxiety" 3.  Continue to work with patient on effective coping strategies for anxiety  Diagnosis: Axis I: generalized anxiety disorder    Axis II: No diagnosis    Cordella Register, LCSW 06/04/2018

## 2018-06-24 ENCOUNTER — Ambulatory Visit (INDEPENDENT_AMBULATORY_CARE_PROVIDER_SITE_OTHER): Payer: BLUE CROSS/BLUE SHIELD | Admitting: Licensed Clinical Social Worker

## 2018-06-24 DIAGNOSIS — F411 Generalized anxiety disorder: Secondary | ICD-10-CM

## 2018-06-24 NOTE — Progress Notes (Signed)
THERAPIST PROGRESS NOTE  Session Time: 9:02 AM to 9:58 AM  Participation Level: Active  Behavioral Response: CasualAlertEuthymic  Type of Therapy: Individual Therapy  Treatment Goals addressed:  decrease anxiety  Interventions: CBT, Solution Focused, Strength-based, Supportive and Reframing  Summary: Brittney Mays is a 33 y.o. female who presents with reporting that she is doing "pretty well." "That there has been some positive stuff. Things have become manageable at work and one reason is that she has two interns to help her now.  Therapist reviewed how much the workload contributes to anxiety.  Patient acknowledges that more is given to her because she does good work.  Shared she did set a boundary with supervisor recently of not attending meetings anymore and therapist provided her with positive feedback as it helps with self-care and decrease of anxiety.  Shares she has her Fitbit now helps her better know exactly how much activity she is involved with and will be a good motivator.  Shares she has a custom nightguard for her grinding and glad that she is done this to address physical issue of grinding related to anxiety.  Elated having "spring fever" that is help with mood as the weather has been warmer and had its been lighter has helped improve mood. Reviewed working with patient on the way she looks at things and having realistic expectations that doing her best is good enough, but the problem still is that what is best for patient is still very high standard.  Reviewed exercises from book "take control of your anxiety" patient is able to recognize all or nothing thinking.  For example she he will not start a task because she will not be able to finish it and identified comments in her head to include "I will do it until it is done, I hate doing things piecemeal," Work with patient on converting all or nothing thought or feeling to a of all or nothing to thought or feeling into more  realistic observation. Encourage patient to practice this on her own, and recognize all or nothing thinking and challenging it.  Shared as well that focus has a lot to do with creating anxiety and we have control over this and that changing focus can help [place] change mood.  Reviewed worksheet "Tips to help clients stop perfectionism". Discussed how it can be harmful when one is too critical of self and not using mistakes to help learn.  Reviewed ways to talk to herself that are kinder and more helpful such as what she would advise a friend in a situation recognizing. Another suggestion is not let your achievement be dictated by others' judgments. Remember that you choose to do something because you want to, not because other people want you to or expect you to. Another is to reemember, what matters is what you think about your completed work, not what others think. If you do something to please others, than it will feel like you are living for them and not for yourself.  Patient reviewed session and relates she is doing what she does for exercise that she catches a symptom early so better able to manage it.  Reviewed relaxation exercises and relates does yoga once a week and yoga exercises every night.  .  Therapist assess patient current functioning and noted some progress with symptoms.  Worked with patient on book "how to control your anxiety" and pointed out helpful insights including Where your attention goes your energy and emotions are sure to follow.  Since we create anxiety by thinking thoughts of threat, sometimes all we need to do is to think about the same situation differently.  This insight helps and recognizing that we have some control and management of her anxiety and that it is arbitrary as it does not exist unless we created by thinking about terrible things that can happen.  Reviewed perceptions that create anxiety and identified patient setting very high standards for self is 1 of the causes for  her anxiety.  Patient reviewed exercise titled "if it is not perfect enough, it is not good enough".  Reviewed situations where she engages in all or nothing thinking how they relate to perfectionism, worked on converting all or nothing thinking into more realistic observation and encouraged patient to continue this by first identifying thought distortion, challenging it and thinking about it differently.  Also reviewed worksheet on perfectionism and identified negative aspect, as well as other coping strategies such as using an internal standard to assess things, looking at things in terms of what she wants and needs and not others standards. Therapist utilize reframing, strength based and supportive intervention.  Also help patient work on challenging all or nothing thinking by identifying and challenging when she made these type of statements in session.    Suicidal/Homicidal: No  Plan: Return again in 2-3 weeks.2.  Therapist continue to work on strategies to challenge perfectionistic thinking, strategies to decrease anxiety  Diagnosis: Axis I:  generalized anxiety disorder    Axis II: No diagnosis    Cordella Register, LCSW 06/24/2018

## 2018-07-16 ENCOUNTER — Ambulatory Visit (HOSPITAL_COMMUNITY): Payer: BLUE CROSS/BLUE SHIELD | Admitting: Licensed Clinical Social Worker

## 2018-11-22 DIAGNOSIS — M25562 Pain in left knee: Secondary | ICD-10-CM | POA: Diagnosis not present

## 2018-12-03 ENCOUNTER — Ambulatory Visit (INDEPENDENT_AMBULATORY_CARE_PROVIDER_SITE_OTHER): Payer: BC Managed Care – PPO | Admitting: Physician Assistant

## 2018-12-03 VITALS — Temp 98.1°F | Ht 67.0 in | Wt 154.0 lb

## 2018-12-03 DIAGNOSIS — Z20828 Contact with and (suspected) exposure to other viral communicable diseases: Secondary | ICD-10-CM

## 2018-12-03 DIAGNOSIS — R0981 Nasal congestion: Secondary | ICD-10-CM

## 2018-12-03 DIAGNOSIS — Z20822 Contact with and (suspected) exposure to covid-19: Secondary | ICD-10-CM

## 2018-12-03 DIAGNOSIS — J029 Acute pharyngitis, unspecified: Secondary | ICD-10-CM | POA: Diagnosis not present

## 2018-12-03 NOTE — Progress Notes (Signed)
Patient ID: Brittney Mays, female   DOB: 07-24-1985, 33 y.o.   MRN: [id number] .Marland KitchenVirtual Visit via Video Note  I connected with Brittney Mays on 12/03/18 at  2:20 PM EDT by a video enabled telemedicine application and verified that I am speaking with the correct person using two identifiers.  Location: Patient: home Provider: clinic   I discussed the limitations of evaluation and management by telemedicine and the availability of in person appointments. The patient expressed understanding and agreed to proceed.  History of Present Illness: Pt is a 32 yo female who calls into the clinic to discuss ST and nasal sinus congestion for 4 days. She has been working from home since a co-worker had a positive test on Wednesday. She did not have any close contact with person. Pt denies any fever, chills, cough, SOB, GI symptoms, loss of smell or taste, headache. She is take OTC meds to help with ST and sinus symptoms.   .. Active Ambulatory Problems    Diagnosis Date Noted  . Chronic bilateral back pain 09/12/2016  . Arthralgia of left temporomandibular joint 12/24/2017  . Anxiety state 02/12/2018   Resolved Ambulatory Problems    Diagnosis Date Noted  . No Resolved Ambulatory Problems   No Additional Past Medical History   Reviewed med, allergy, problem list.     Observations/Objective:    Assessment and Plan: Marland KitchenMarland KitchenKhilynn was seen today for sore throat.  Diagnoses and all orders for this visit:  Sore throat  Congestion of nasal sinus  Exposure to Covid-19 Virus   Pt needs to be tested for COVID despite just mild symptoms.  [facility] go to [facility] or [facility] drive by location for testing.  Pt is aware to self isolate until results are received and can be cleared to go back to work.  Continue to treat ST/congestion with OTC sinus cold/cough/severe.  If develop worsening symptoms call our office or go to ED.  Note in EMR for work.   Follow Up Instructions:    I  discussed the assessment and treatment plan with the patient. The patient was provided an opportunity to ask questions and all were answered. The patient agreed with the plan and demonstrated an understanding of the instructions.   The patient was advised to call back or seek an in-person evaluation if the symptoms worsen or if the condition fails to improve as anticipated.   Iran Planas, PA-C

## 2018-12-03 NOTE — Progress Notes (Signed)
Sore throat, congestion. Started Saturday.   No known contact with Covid, other than coworker in a different area of the building having mild symptoms of Covid but hasn't been tested.

## 2018-12-04 DIAGNOSIS — J029 Acute pharyngitis, unspecified: Secondary | ICD-10-CM | POA: Diagnosis not present

## 2018-12-04 DIAGNOSIS — B349 Viral infection, unspecified: Secondary | ICD-10-CM | POA: Diagnosis not present

## 2018-12-04 DIAGNOSIS — Z20828 Contact with and (suspected) exposure to other viral communicable diseases: Secondary | ICD-10-CM | POA: Diagnosis not present

## 2019-03-11 ENCOUNTER — Other Ambulatory Visit: Payer: Self-pay

## 2019-03-11 ENCOUNTER — Ambulatory Visit (INDEPENDENT_AMBULATORY_CARE_PROVIDER_SITE_OTHER): Payer: BC Managed Care – PPO | Admitting: Osteopathic Medicine

## 2019-03-11 ENCOUNTER — Encounter: Payer: Self-pay | Admitting: Osteopathic Medicine

## 2019-03-11 VITALS — BP 121/77 | HR 88 | Temp 98.6°F | Wt 152.0 lb

## 2019-03-11 DIAGNOSIS — R202 Paresthesia of skin: Secondary | ICD-10-CM

## 2019-03-11 NOTE — Progress Notes (Signed)
HPI: Brittney Mays is a 33 y.o. female who  has no past medical history on file.  she presents to [facility] today, 03/11/19,  for chief complaint of:  Tingling/numbness  These type of symptoms started about 2 years ago with some intermittent numbness on the left fifth toe.  It would happen once a month for a couple of minutes.  About a year and a half ago, she noticed that at yoga when she was doing any poses that required arms out up, she would notice her hands were tingling.  This seemed to resolve on its own after a few months.  Over the summer, she noticed on 2 separate days, while walking barefoot, that she seemed to lack sensation on the bottom of her left foot like she had stepped on something but seem to be blocking her ability to feel the floor.  About 3 months ago, noticed some tingling on the left mid back, was concerned that her bra might be a little bit tight but seemed to happen even when she was not wearing a bra.  Would also kind of come and go.  Few weeks ago, noticed worsening tingling on her left foot and lateral left hand.    At this point today, she has no symptoms in the office.  She denies any focal weakness, fatigue with doing normal activities, proximal muscle weakness such as with washing her hair, no decreased grip strength, no unsteadiness of gait, no feeling of imbalance.  She does have some concerns about some increased anxiety.   Her husband has issues with similar sensations, reportedly due to idiopathic neuropathy after evaluation with neurology.     At today's visit 03/11/19 ... PMH, PSH, FH reviewed and updated as needed.  Current medication list and allergy/intolerance hx reviewed and updated as needed. (See remainder of HPI, ROS, Phys Exam below)   No results found.  No results found for this or any previous visit (from the past 72 hour(s)).        ASSESSMENT/PLAN: The encounter diagnosis was  Paresthesia.   Orders Placed This Encounter  Procedures  . CBC  . COMPLETE METABOLIC PANEL WITH GFR  . TSH  . Vitamin B12  . Fe+TIBC+Fer     No orders of the defined types were placed in this encounter.   Patient Instructions  Let us get some labs to investigate possible causes such as B12 deficiency, iron deficiency, thyroid problem.  Depending on blood work, will go from there but I think we will probably end up seeing a neurologist to see if we need any further testing like EMG, MRI, or other evaluation/treatment.  Depending on what the neurologist recommends, we may also consider treatment for anxiety, if we cannot find any other reason for your symptoms.  Let's rule out all the physical/organic causes first       Follow-up plan: Return for RECHECK PENDING RESULTS, NEUROLOGY VISIT / SEE ME ASAP IF WORSE OR CHANGE.                                                 ################################################# ################################################# ################################################# #################################################    No outpatient medications have been marked as taking for the 03/11/19 encounter (Office Visit) with Emeterio Reeve, DO.    Allergies  Allergen Reactions  . Amoxicillin Hives  Review of Systems:  Constitutional: No recent illness, no fever   HEENT: No  headache, no vision change  Cardiac: No  chest pain, No  pressure, No palpitations  Respiratory:  No  shortness of breath. No  Cough  Gastrointestinal: No  abdominal pain, no change on bowel habits  Musculoskeletal: No new myalgia/arthralgia  Skin: No  Rash  Neurologic: No  weakness, No  Dizziness  Psychiatric: No  concerns with depression, +concerns with anxiety  Exam:  BP 121/77 (BP Location: Right Arm, Patient Position: Sitting, Cuff Size: Normal)   Pulse 88   Temp 98.6 F (37 C)  (Oral)   Wt 152 lb 0.6 oz (69 kg)   BMI 23.81 kg/m   Constitutional: VS see above. General Appearance: alert, well-developed, well-nourished, NAD  Eyes: Normal lids and conjunctive, non-icteric sclera  Neck: No masses, trachea midline.   Respiratory: Normal respiratory effort. no wheeze, no rhonchi, no rales  Cardiovascular: S1/S2 normal, no murmur, no rub/gallop auscultated. RRR.   Musculoskeletal: Gait normal. Symmetric and independent movement of all extremities  Neurological: Normal balance/coordination. No tremor.  5 out of 5 in all 4 extremities.  EOMI, PERRL.  Normal cerebellar reflexes  Skin: warm, dry, intact.   Psychiatric: Normal judgment/insight. Normal mood and affect. Oriented x3.       Visit summary with medication list and pertinent instructions was printed for patient to review, patient was advised to alert [place] if any updates are needed. All questions at time of visit were answered - patient instructed to contact office with any additional concerns. ER/RTC precautions were reviewed with the patient and understanding verbalized.   Note: Total time spent 25 minutes, greater than 50% of the visit was spent face-to-face counseling and coordinating care for the following: The encounter diagnosis was Paresthesia..  Please note: voice recognition software was used to produce this document, and typos may escape review. Please contact Dr. Sheppard Coil for any needed clarifications.    Follow up plan: Return for RECHECK PENDING RESULTS, NEUROLOGY VISIT / SEE ME ASAP IF WORSE OR CHANGE.

## 2019-03-11 NOTE — Patient Instructions (Addendum)
Let us get some labs to investigate possible causes such as B12 deficiency, iron deficiency, thyroid problem.  Depending on blood work, will go from there but I think we will probably end up seeing a neurologist to see if we need any further testing like EMG, MRI, or other evaluation/treatment.  Depending on what the neurologist recommends, we may also consider treatment for anxiety, if we cannot find any other reason for your symptoms.  Let's rule out all the physical/organic causes first

## 2019-03-12 LAB — CBC
HCT: 40.7 % (ref 35.0–45.0)
Hemoglobin: 13.9 g/dL (ref 11.7–15.5)
MCH: 33.1 pg — ABNORMAL HIGH (ref 27.0–33.0)
MCHC: 34.2 g/dL (ref 32.0–36.0)
MCV: 96.9 fL (ref 80.0–100.0)
MPV: 11.5 fL (ref 7.5–12.5)
Platelets: 201 10*3/uL (ref 140–400)
RBC: 4.2 10*6/uL (ref 3.80–5.10)
RDW: 11.8 % (ref 11.0–15.0)
WBC: 4.7 10*3/uL (ref 3.8–10.8)

## 2019-03-12 LAB — TSH: TSH: 1.89 mIU/L

## 2019-03-12 LAB — COMPLETE METABOLIC PANEL WITH GFR
AG Ratio: 1.8 (calc) (ref 1.0–2.5)
ALT: 11 U/L (ref 6–29)
AST: 14 U/L (ref 10–30)
Albumin: 4.6 g/dL (ref 3.6–5.1)
Alkaline phosphatase (APISO): 36 U/L (ref 31–125)
BUN: 11 mg/dL (ref 7–25)
CO2: 29 mmol/L (ref 20–32)
Calcium: 9.5 mg/dL (ref 8.6–10.2)
Chloride: 106 mmol/L (ref 98–110)
Creat: 0.88 mg/dL (ref 0.50–1.10)
GFR, Est African American: 101 mL/min/{1.73_m2} (ref >=60)
GFR, Est Non African American: 87 mL/min/{1.73_m2} (ref >=60)
Globulin: 2.5 g/dL (calc) (ref 1.9–3.7)
Glucose, Bld: 91 mg/dL (ref 65–99)
Potassium: 4.3 mmol/L (ref 3.5–5.3)
Sodium: 141 mmol/L (ref 135–146)
Total Bilirubin: 0.6 mg/dL (ref 0.2–1.2)
Total Protein: 7.1 g/dL (ref 6.1–8.1)

## 2019-03-12 LAB — IRON,TIBC AND FERRITIN PANEL
%SAT: 27 % (calc) (ref 16–45)
Ferritin: 29 ng/mL (ref 16–154)
Iron: 88 ug/dL (ref 40–190)
TIBC: 329 mcg/dL (calc) (ref 250–450)

## 2019-03-12 LAB — VITAMIN B12: Vitamin B-12: 368 pg/mL (ref 200–1100)

## 2019-04-15 ENCOUNTER — Ambulatory Visit: Payer: BC Managed Care – PPO | Admitting: Neurology

## 2019-04-15 ENCOUNTER — Other Ambulatory Visit: Payer: Self-pay

## 2019-04-15 ENCOUNTER — Encounter: Payer: Self-pay | Admitting: Neurology

## 2019-04-15 VITALS — BP 107/68 | HR 84 | Temp 97.7°F | Ht 67.0 in | Wt 152.0 lb

## 2019-04-15 DIAGNOSIS — R202 Paresthesia of skin: Secondary | ICD-10-CM | POA: Diagnosis not present

## 2019-04-15 DIAGNOSIS — M7918 Myalgia, other site: Secondary | ICD-10-CM | POA: Insufficient documentation

## 2019-04-15 NOTE — Patient Instructions (Addendum)
EMG/NCS: bilateral uppers (CTS vs Ulnar or both) and left leg. MRI of the brain and cervical spine to evaluate for MS or other central etiology Dry Needling for her thoracic/lumbar paraspinal myofascial pain Serum neuropathy panel.   Peripheral Neuropathy Peripheral neuropathy is a type of nerve damage. It affects nerves that carry signals between the spinal cord and the arms, legs, and the rest of the body (peripheral nerves). It does not affect nerves in the spinal cord or brain. In peripheral neuropathy, one nerve or a group of nerves may be damaged. Peripheral neuropathy is a broad category that includes many specific nerve disorders, like diabetic neuropathy, hereditary neuropathy, and carpal tunnel syndrome. What are the causes? This condition may be caused by:  Diabetes. This is the most common cause of peripheral neuropathy.  Nerve injury.  Pressure or stress on a nerve that lasts a long time.  Lack (deficiency) of B vitamins. This can result from alcoholism, poor diet, or a restricted diet.  Infections.  Autoimmune diseases, such as rheumatoid arthritis and systemic lupus erythematosus.  Nerve diseases that are passed from parent to child (inherited).  Some medicines, such as cancer medicines (chemotherapy).  Poisonous (toxic) substances, such as lead and mercury.  Too little blood flowing to the legs.  Kidney disease.  Thyroid disease. In some cases, the cause of this condition is not known. What are the signs or symptoms? Symptoms of this condition depend on which of your nerves is damaged. Common symptoms include:  Loss of feeling (numbness) in the feet, hands, or both.  Tingling in the feet, hands, or both.  Burning pain.  Very sensitive skin.  Weakness.  Not being able to move a part of the body (paralysis).  Muscle twitching.  Clumsiness or poor coordination.  Loss of balance.  Not being able to control your bladder.  Feeling dizzy.  Sexual  problems. How is this diagnosed? Diagnosing and finding the cause of peripheral neuropathy can be difficult. Your health care provider will take your medical history and do a physical exam. A neurological exam will also be done. This involves checking things that are affected by your brain, spinal cord, and nerves (nervous system). For example, your health care provider will check your reflexes, how you move, and what you can feel. You may have other tests, such as:  Blood tests.  Electromyogram (EMG) and nerve conduction tests. These tests check nerve function and how well the nerves are controlling the muscles.  Imaging tests, such as CT scans or MRI to rule out other causes of your symptoms.  Removing a small piece of nerve to be examined in a lab (nerve biopsy). This is rare.  Removing and examining a small amount of the fluid that surrounds the brain and spinal cord (lumbar puncture). This is rare. How is this treated? Treatment for this condition may involve:  Treating the underlying cause of the neuropathy, such as diabetes, kidney disease, or vitamin deficiencies.  Stopping medicines that can cause neuropathy, such as chemotherapy.  Medicine to relieve pain. Medicines may include: ? Prescription or over-the-counter pain medicine. ? Antiseizure medicine. ? Antidepressants. ? Pain-relieving patches that are applied to painful areas of skin.  Surgery to relieve pressure on a nerve or to destroy a nerve that is causing pain.  Physical therapy to help improve movement and balance.  Devices to help you move around (assistive devices). Follow these instructions at home: Medicines  Take over-the-counter and prescription medicines only as told by your health  care provider. Do not take any other medicines without first asking your health care provider.  Do not drive or use heavy machinery while taking prescription pain medicine. Lifestyle   Do not use any products that contain  nicotine or tobacco, such as cigarettes and e-cigarettes. Smoking keeps blood from reaching damaged nerves. If you need help quitting, ask your health care provider.  Avoid or limit alcohol. Too much alcohol can cause a vitamin B deficiency, and vitamin B is needed for healthy nerves.  Eat a healthy diet. This includes: ? Eating foods that are high in fiber, such as fresh fruits and vegetables, whole grains, and beans. ? Limiting foods that are high in fat and processed sugars, such as fried or sweet foods. General instructions   If you have diabetes, work closely with your health care provider to keep your blood sugar under control.  If you have numbness in your feet: ? Check every day for signs of injury or infection. Watch for redness, warmth, and swelling. ? Wear padded socks and comfortable shoes. These help protect your feet.  Develop a good support system. Living with peripheral neuropathy can be stressful. Consider talking with a mental health specialist or joining a support group.  Use assistive devices and attend physical therapy as told by your health care provider. This may include using a walker or a cane.  Keep all follow-up visits as told by your health care provider. This is important. Contact a health care provider if:  You have new signs or symptoms of peripheral neuropathy.  You are struggling emotionally from dealing with peripheral neuropathy.  Your pain is not well-controlled. Get help right away if:  You have an injury or infection that is not healing normally.  You develop new weakness in an arm or leg.  You fall frequently. Summary  Peripheral neuropathy is when the nerves in the arms, or legs are damaged, resulting in numbness, weakness, or pain.  There are many causes of peripheral neuropathy, including diabetes, pinched nerves, vitamin deficiencies, autoimmune disease, and hereditary conditions.  Diagnosing and finding the cause of peripheral  neuropathy can be difficult. Your health care provider will take your medical history, do a physical exam, and do tests, including blood tests and nerve function tests.  Treatment involves treating the underlying cause of the neuropathy and taking medicines to help control pain. Physical therapy and assistive devices may also help. This information is not intended to replace advice given to you by your health care provider. Make sure you discuss any questions you have with your health care provider. Document Released: 04/07/2002 Document Revised: 03/30/2017 Document Reviewed: 06/26/2016 Elsevier Patient Education  2020 [place] is a test to check how well your muscles and nerves are working. This procedure includes the combined use of electromyogram (EMG) and nerve conduction study (NCS). EMG is used to look for muscular disorders. NCS, which is also called electroneurogram, measures how well your nerves are controlling your muscles. The procedures are usually done together to check if your muscles and nerves are healthy. If the results of the tests are abnormal, this may indicate disease or injury, such as a neuromuscular disease or peripheral nerve damage. Tell a health care provider about: Any allergies you have. All medicines you are taking, including vitamins, herbs, eye drops, creams, and over-the-counter medicines. Any problems you or family members have had with anesthetic medicines. Any blood disorders you have. Any surgeries you have had. Any medical conditions you  have. If you have a pacemaker. Whether you are pregnant or may be pregnant. What are the risks? Generally, this is a safe procedure. However, problems may occur, including: Infection where the electrodes were inserted. Bleeding. What happens before the procedure? Medicines Ask your health care provider about: Changing or stopping your regular medicines. This is especially  important if you are taking diabetes medicines or blood thinners. Taking medicines such as aspirin and ibuprofen. These medicines can thin your blood. Do not take these medicines unless your health care provider tells you to take them. Taking over-the-counter medicines, vitamins, herbs, and supplements. General instructions Your health care provider may ask you to avoid: Beverages that have caffeine, such as coffee and tea. Any products that contain nicotine or tobacco. These products include cigarettes, e-cigarettes, and chewing tobacco. If you need help quitting, ask your health care provider. Do not use lotions or creams on the same day that you will be having the procedure. What happens during the procedure? For EMG  Your health care provider will ask you to stay in a position so that he or she can access the muscle that will be studied. You may be standing, sitting, or lying down. You may be given a medicine that numbs the area (local anesthetic). A very thin needle that has an electrode will be inserted into your muscle. Another small electrode will be placed on your skin near the muscle. Your health care provider will ask you to continue to remain still. The electrodes will send a signal that tells about the electrical activity of your muscles. You may see this on a monitor or hear it in the room. After your muscles have been studied at rest, your health care provider will ask you to contract or flex your muscles. The electrodes will send a signal that tells about the electrical activity of your muscles. Your health care provider will remove the electrodes and the electrode needles when the procedure is finished. The procedure may vary among health care providers and hospitals. For NCS  An electrode that records your nerve activity (recording electrode) will be placed on your skin by the muscle that is being studied. An electrode that is used as a reference (reference electrode) will be  placed near the recording electrode. A paste or gel will be applied to your skin between the recording electrode and the reference electrode. Your nerve will be stimulated with a mild shock. Your health care provider will measure how much time it takes for your muscle to react. Your health care provider will remove the electrodes and the gel when the procedure is finished. The procedure may vary among health care providers and hospitals. What happens after the procedure? It is up to you to get the results of your procedure. Ask your health care provider, or the department that is doing the procedure, when your results will be ready. Your health care provider may: Give you medicines for any pain. Monitor the insertion sites to make sure that bleeding stops. Summary Electromyoneurogram is a test to check how well your muscles and nerves are working. If the results of the tests are abnormal, this may indicate disease or injury. This is a safe procedure. However, problems may occur, such as bleeding and infection. Your health care provider will do two tests to complete this procedure. One checks your muscles (EMG) and another checks your nerves (NCS). It is up to you to get the results of your procedure. Ask your health care provider, or  the department that is doing the procedure, when your results will be ready. This information is not intended to replace advice given to you by your health care provider. Make sure you discuss any questions you have with your health care provider. Document Released: 08/18/2004 Document Revised: 01/01/2018 Document Reviewed: 12/14/2017 Elsevier Patient Education  2020 [employer].

## 2019-04-15 NOTE — Progress Notes (Signed)
[facility]    Provider:  Dr Jaynee Eagles Requesting Provider: Emeterio Reeve, DO Primary Care Provider:  Emeterio Reeve, DO  CC:  tingling  HPI:  Brittney Mays is a 33 y.o. female here as requested by Emeterio Reeve, DO for paresthesias. She says it started years ago, she noticed her left pinky toe was numb, she forgot about it, sporadic. Then 18 months ago her hands started tingling in certain positions, arms stretched or up or our, her hands would tingle, she doesn't know which fingers, sometimes one hand more than the other. She started noticing numbness on the bottom of her feet, happened twice, like she had something on her feet but nothing was there. 4 months ago worsened, a spot on her lower back, start tingling (points to the to the thoracic area on the left side). 5 weeks ago she started having tingling in the left hand in the pinky and ring finger. Episodes are brief, one minute, other than the hands which lasted hours and comes and goes. No joint pain except on the left knee she had a few episodes of pain with walking, no other significant joint pain. She has had some white apthous ulcers in the mouth a few times 2x a year. No weakness, urinary changes, changes to vision, SOB. She is tense and anxious. She has had muscle spasms and tightness in the neck muscles.    (Reviewed XR mandible report: FINDINGS: Bone mineralization is within normal limits. Bilateral TMJ alignment appears within normal limits. The mandible appears intact. No acute osseous abnormality identified. Symmetric and normal appearing paranasal sinus and mastoid pneumatization. IMPRESSION: Negative radiographic mandible series.)  Review of Systems: Patient complains of symptoms per HPI as well as the following symptoms: numbness and tingling. Pertinent negatives and positives per HPI. All others negative.   Social History   Socioeconomic History  . Marital status: Married   Spouse name: Not on file  . Number of children: 0  . Years of education: Not on file  . Highest education level: Bachelor's degree (e.g., BA, AB, BS)  Occupational History  . Not on file  Tobacco Use  . Smoking status: Never Smoker  . Smokeless tobacco: Never Used  Substance and Sexual Activity  . Alcohol use: Yes    Alcohol/week: 2.0 standard drinks    Types: 2 Standard drinks or equivalent per week  . Drug use: Never  . Sexual activity: Not on file  Other Topics Concern  . Not on file  Social History Narrative   Lives at home with husband   Right handed   Caffeine: 1 cup/day   Social Determinants of Health   Financial Resource Strain:   . Difficulty of Paying Living Expenses: Not on file  Food Insecurity:   . Worried About Charity fundraiser in the Last Year: Not on file  . Ran Out of Food in the Last Year: Not on file  Transportation Needs:   . Lack of Transportation (Medical): Not on file  . Lack of Transportation (Non-Medical): Not on file  Physical Activity:   . Days of Exercise per Week: Not on file  . Minutes of Exercise per Session: Not on file  Stress:   . Feeling of Stress : Not on file  Social Connections:   . Frequency of Communication with Friends and Family: Not on file  . Frequency of Social Gatherings with Friends and Family: Not on file  . Attends Religious Services: Not on file  . Active  Member of Clubs or Organizations: Not on file  . Attends Archivist Meetings: Not on file  . Marital Status: Not on file  Intimate Partner Violence:   . Fear of Current or Ex-Partner: Not on file  . Emotionally Abused: Not on file  . Physically Abused: Not on file  . Sexually Abused: Not on file    Family History  Problem Relation Age of Onset  . Hypertension Mother   . Other Mother        acoustic neuroma  . Cancer Sister   . Cancer Paternal Uncle   . Diabetes Paternal Uncle   . Depression Maternal Grandmother   . Diabetes Paternal Grandfather    . Neuropathy Neg Hx     Past Medical History:  Diagnosis Date  . Chronic back pain   . Knee pain     Patient Active Problem List   Diagnosis Date Noted  . Paresthesias 04/15/2019  . Myofascial pain 04/15/2019  . Anxiety state 02/12/2018  . Arthralgia of left temporomandibular joint 12/24/2017  . Chronic bilateral back pain 09/12/2016    Past Surgical History:  Procedure Laterality Date  . WISDOM TOOTH EXTRACTION      No current outpatient medications on file.   No current facility-administered medications for this visit.    Allergies as of 04/15/2019 - Review Complete 04/15/2019  Allergen Reaction Noted  . Amoxicillin Hives 07/23/2015    Vitals: BP 107/68 (BP Location: Left Arm, Patient Position: Sitting)   Pulse 84   Temp 97.7 F (36.5 C) Comment: taken at front  Ht 5\' 7"  (1.702 m)   Wt 152 lb (68.9 kg)   BMI 23.81 kg/m  Last Weight:  Wt Readings from Last 1 Encounters:  04/15/19 152 lb (68.9 kg)   Last Height:   Ht Readings from Last 1 Encounters:  04/15/19 5\' 7"  (1.702 m)     Physical exam: Exam: Gen: NAD, conversant, well nourised, well groomed                     CV: RRR, no MRG. No Carotid Bruits. No peripheral edema, warm, nontender Eyes: Conjunctivae clear without exudates or hemorrhage  Neuro: Detailed Neurologic Exam  Speech:    Speech is normal; fluent and spontaneous with normal comprehension.  Cognition:    The patient is oriented to person, place, and time;     recent and remote memory intact;     language fluent;     normal attention, concentration,     fund of knowledge Cranial Nerves:    The pupils are equal, round, and reactive to light. The fundi are normal and spontaneous venous pulsations are present. Visual fields are full to finger confrontation. Extraocular movements are intact. Trigeminal sensation is intact and the muscles of mastication are normal. The face is symmetric. The palate elevates in the midline. Hearing  intact. Voice is normal. Shoulder shrug is normal. The tongue has normal motion without fasciculations.   Coordination:    Normal finger to nose and heel to shin. Normal rapid alternating movements.   Gait:    Heel-toe and tandem gait are normal.   Motor Observation:    No asymmetry, no atrophy, and no involuntary movements noted. Tone:    Normal muscle tone.    Posture:    Posture is normal. normal erect    Strength:    Strength is V/V in the upper and lower limbs.      Sensation: intact to LT  Reflex Exam:  DTR's:    Deep tendon reflexes in the upper and lower extremities are normal bilaterally.   Toes:    The toes are downgoing bilaterally.   Clonus:    Clonus is absent.    Assessment/Plan: This is a very nice 33 year old female with random paresthesias.  They have been going on for 2 years in multiple parts of her body.  I think that may be explained by different things including upper extremity ulnar neuropathy or nerve entrapment, the pain and tingling in her thoracic spine has been ongoing for years and she has chronic back pain and per patient report some scoliosis.  The symptoms in her legs are quite vague, for example twice she has had feelings of something on the bottom of her foot briefly.  However I do think she needs a complete work-up including serum neuropathy panels and MRI of the brain and cervical spine to evaluate for multiple sclerosis. This may also be anxiety.  EMG/NCS: bilateral uppers (CTS vs Ulnar or both) and left leg. MRI of the brain and cervical spine to evaluate for MS or other central etiology Dry Needling for her thoracic/lumbar paraspinal myofascial pain Serum neuropathy panel. Dry Needling for paraspinal myofascial pain  Orders Placed This Encounter  Procedures  . MR BRAIN W WO CONTRAST  . MR CERVICAL SPINE W WO CONTRAST  . B12 and Folate Panel  . Methylmalonic Acid, Serum  . TSH  . HIV antibody  . Sedimentation rate  . ANA  w/Reflex  . Heavy Metals, Blood  . Vitamin B6  . CMP  . CBC  . Rheumatoid factor  . Sjogren's syndrome antibods(ssa + ssb)  . RPR  . IFE and PE, Serum  . Ambulatory referral to Physical Therapy  . NCV with EMG(electromyography)     Cc: Emeterio Reeve, DO,    Sarina Ill, MD  [facility] [address] [place], [place] [address]  Phone [phone] Fax [phone]

## 2019-04-19 LAB — COMPREHENSIVE METABOLIC PANEL
ALT: 14 IU/L (ref 0–32)
AST: 18 IU/L (ref 0–40)
Albumin/Globulin Ratio: 2 (ref 1.2–2.2)
Albumin: 4.5 g/dL (ref 3.8–4.8)
Alkaline Phosphatase: 45 IU/L (ref 39–117)
BUN/Creatinine Ratio: 14 (ref 9–23)
BUN: 12 mg/dL (ref 6–20)
Bilirubin Total: 0.6 mg/dL (ref 0.0–1.2)
CO2: 26 mmol/L (ref 20–29)
Calcium: 9.6 mg/dL (ref 8.7–10.2)
Chloride: 103 mmol/L (ref 96–106)
Creatinine, Ser: 0.86 mg/dL (ref 0.57–1.00)
GFR calc Af Amer: 103 mL/min/{1.73_m2} (ref >59)
GFR calc non Af Amer: 90 mL/min/{1.73_m2} (ref >59)
Globulin, Total: 2.2 g/dL (ref 1.5–4.5)
Glucose: 94 mg/dL (ref 65–99)
Potassium: 4.6 mmol/L (ref 3.5–5.2)
Sodium: 143 mmol/L (ref 134–144)
Total Protein: 6.7 g/dL (ref 6.0–8.5)

## 2019-04-19 LAB — B12 AND FOLATE PANEL
Folate: 8.6 ng/mL (ref >3.0)
Vitamin B-12: 271 pg/mL (ref 232–1245)

## 2019-04-19 LAB — CBC
Hematocrit: 40.5 % (ref 34.0–46.6)
Hemoglobin: 13.6 g/dL (ref 11.1–15.9)
MCH: 32.9 pg (ref 26.6–33.0)
MCHC: 33.6 g/dL (ref 31.5–35.7)
MCV: 98 fL — ABNORMAL HIGH (ref 79–97)
Platelets: 208 10*3/uL (ref 150–450)
RBC: 4.14 x10E6/uL (ref 3.77–5.28)
RDW: 11.3 % — ABNORMAL LOW (ref 11.7–15.4)
WBC: 4.6 10*3/uL (ref 3.4–10.8)

## 2019-04-19 LAB — SJOGREN'S SYNDROME ANTIBODS(SSA + SSB)
ENA SSA (RO) Ab: <0.2 AI (ref 0.0–0.9)
ENA SSB (LA) Ab: <0.2 AI (ref 0.0–0.9)

## 2019-04-19 LAB — MULTIPLE MYELOMA PANEL, SERUM
Albumin SerPl Elph-Mcnc: 3.9 g/dL (ref 2.9–4.4)
Albumin/Glob SerPl: 1.4 (ref 0.7–1.7)
Alpha 1: 0.2 g/dL (ref 0.0–0.4)
Alpha2 Glob SerPl Elph-Mcnc: 0.6 g/dL (ref 0.4–1.0)
B-Globulin SerPl Elph-Mcnc: 1 g/dL (ref 0.7–1.3)
Gamma Glob SerPl Elph-Mcnc: 1 g/dL (ref 0.4–1.8)
Globulin, Total: 2.8 g/dL (ref 2.2–3.9)
IgA/Immunoglobulin A, Serum: 268 mg/dL (ref 87–352)
IgG (Immunoglobin G), Serum: 961 mg/dL (ref 586–1602)
IgM (Immunoglobulin M), Srm: 162 mg/dL (ref 26–217)

## 2019-04-19 LAB — METHYLMALONIC ACID, SERUM: Methylmalonic Acid: 253 nmol/L (ref 0–378)

## 2019-04-19 LAB — HEAVY METALS, BLOOD
Arsenic: 6 ug/L (ref 2–23)
Lead, Blood: <1 ug/dL (ref 0–4)
Mercury: <1 ug/L (ref 0.0–14.9)

## 2019-04-19 LAB — RPR: RPR Ser Ql: NONREACTIVE

## 2019-04-19 LAB — HIV ANTIBODY (ROUTINE TESTING W REFLEX): HIV Screen 4th Generation wRfx: NONREACTIVE

## 2019-04-19 LAB — VITAMIN B6: Vitamin B6: 20.6 ug/L (ref 2.0–32.8)

## 2019-04-19 LAB — ANA W/REFLEX: Anti Nuclear Antibody (ANA): NEGATIVE

## 2019-04-19 LAB — SEDIMENTATION RATE: Sed Rate: 3 mm/hr (ref 0–32)

## 2019-04-19 LAB — TSH: TSH: 2.1 u[IU]/mL (ref 0.450–4.500)

## 2019-04-19 LAB — RHEUMATOID FACTOR: Rheumatoid fact SerPl-aCnc: <10 IU/mL (ref 0.0–13.9)

## 2019-04-22 ENCOUNTER — Telehealth: Payer: Self-pay

## 2019-04-22 NOTE — Telephone Encounter (Signed)
Noted. I will withdraw request. Romelle Starcher, can you please notify patient, thanks.

## 2019-04-22 NOTE — Telephone Encounter (Signed)
Please withdraw the request. Let her know that insurance has denied it until we perform the emg/ncs. Unfortunately sometimes they require this. I will perform the emg/ncs and if I don;t find anything to explain then I will reorder the imaging. thanks

## 2019-04-22 NOTE — Telephone Encounter (Signed)
Spoke with patient at [phone]. She is ok with this plan. EMG/NCV on 05/29/19 then possible reorder of imaging based on results of that test.

## 2019-04-22 NOTE — Telephone Encounter (Signed)
[employer] has denied both the MRI Brain and Cervical spine at this time. If Dr. Jaynee Eagles would like to discuss/appeal this decision she can contact [employer] at [phone]. This case will close on 04/24/2019.

## 2019-04-28 ENCOUNTER — Encounter: Payer: Self-pay | Admitting: Physical Therapy

## 2019-04-28 ENCOUNTER — Ambulatory Visit (INDEPENDENT_AMBULATORY_CARE_PROVIDER_SITE_OTHER): Payer: BC Managed Care – PPO | Admitting: Physical Therapy

## 2019-04-28 ENCOUNTER — Other Ambulatory Visit: Payer: Self-pay

## 2019-04-28 DIAGNOSIS — G8929 Other chronic pain: Secondary | ICD-10-CM

## 2019-04-28 DIAGNOSIS — M545 Low back pain: Secondary | ICD-10-CM

## 2019-04-28 NOTE — Patient Instructions (Signed)
Access Code: [email or web address]  URL: [email or web address]  Date: 04/28/2019  Prepared by: Madelyn Flavors   Exercises [place] Market on Table - 3 reps - 1 sets - 30-60 sec hold - 2x daily - 7x weekly Modified Thomas Stretch - 3 reps - 1 sets - 60 sec hold - 2-3x daily - 7x weekly Sidelying Open Book Thoracic Lumbar Rotation and Extension - 5 reps - 1 sets - 5 sec hold - 1x daily - 7x weekly Patient Education Trigger Point Dry Needling

## 2019-04-28 NOTE — Therapy (Signed)
[place], [place], [address] Phone: [phone]   Fax:  [phone]  Physical Therapy Evaluation  Patient Details  Name: Brittney Mays MRN: [id number] Date of Birth: 1985/10/21 Referring Provider (PT): Sarina Ill MD   Encounter Date: 04/28/2019  PT End of Session - 04/28/19 1155    Visit Number  1    Number of Visits  8    Date for PT Re-Evaluation  06/09/19    PT Start Time  1155    PT Stop Time  1242    PT Time Calculation (min)  47 min    Activity Tolerance  Patient tolerated treatment well    Behavior During Therapy  [facility] for tasks assessed/performed       Past Medical History:  Diagnosis Date  . Chronic back pain   . Knee pain     Past Surgical History:  Procedure Laterality Date  . WISDOM TOOTH EXTRACTION      There were no vitals filed for this visit.   Subjective Assessment - 04/28/19 1155    Subjective  Patient reporting right sided pain in mid back and hip. She has had mid back pain since she was a teenager. Pain is intermittent. She does feel like it is related to her stress and anxiety. She reports that 1/3 of the time she has no pain,  1/3 of the time she's aware of it, 1/3 of time it is uncomfortable/painful.  Patient also having insidious tingling in left upper back and left ulnar nerve distribution and also tingling in bil feet.    Pertinent History  undergoing testing for N/T    Diagnostic tests  xrays    Patient Stated Goals  get rid of pain    Currently in Pain?  Yes    Pain Score  3    gets up to 6/10   Pain Location  Back    Pain Orientation  Mid;Right    Pain Descriptors / Indicators  Aching    Pain Type  Chronic pain    Pain Onset  More than a month ago    Pain Frequency  Intermittent    Aggravating Factors   stress    Pain Relieving Factors  stretching, MFR, riding in car makes it worse (greater than an hour)    Effect of Pain on Daily Activities   works through pain         [facility] PT Assessment - 04/28/19 0001      Assessment   Medical Diagnosis  myofascial pain    Referring Provider (PT)  Sarina Ill MD    Onset Date/Surgical Date  04/01/19    Next MD Visit  January     Prior Therapy  2016       Precautions   Precautions  None      Restrictions   Weight Bearing Restrictions  No      Balance Screen   Has the patient fallen in the past 6 months  No      [place] residence      Prior Function   Level of Independence  Independent    Vocation  Full time employment    Warehouse manager, 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     Leisure  hiking, walking dog      Posture/Postural Control   Posture/Postural Control  Postural limitations  Postural Limitations  Increased lumbar lordosis    Posture Comments  thoracic rotation - left ribs anterior to right      ROM / Strength   AROM / PROM / Strength  AROM;Strength      AROM   Overall AROM Comments  Lumbar flex/ext WFL, Right SB 75%, Lt WNL, bil rotation decreased 50% no pain      Strength   Overall Strength Comments  grossly 5/5 in BLE      Flexibility   Soft Tissue Assessment /Muscle Length  yes    Hamstrings  mild tightness bil    Quadriceps  WNL    ITB  WNL    Piriformis  mild tightness bil    Levator Ani  tight hip flexors bil left > right    Quadratus Lumborum  tight lumbar paraspinals and QL      Palpation   Spinal mobility  decreased PA mobility bil throughout; decreased thoracic mobility bil in quadriped    Palpation comment  tight and tender left hip flexor                Objective measurements completed on examination: See above findings.      [facility] Adult PT Treatment/Exercise - 04/28/19 0001      Manual Therapy   Manual Therapy  Soft tissue mobilization;Joint mobilization    Manual therapy comments  skilled palpation and monitoring of soft tissue  during DN     Joint Mobilization  PA mobs unilateral to bil lumbar gd II/III    Soft tissue mobilization  to right lumbar and QL       Trigger Point Dry Needling - 04/28/19 0001    Consent Given?  Yes    Education Handout Provided  Yes    Muscles Treated Back/Hip  Erector spinae;Quadratus lumborum    Dry Needling Comments  right    Erector spinae Response  Palpable increased muscle length    Quadratus Lumborum Response  Twitch response elicited;Palpable increased muscle length           PT Education - 04/28/19 1316    Education Details  DN education and aftercare; HEP    Person(s) Educated  Patient    Methods  Explanation;Demonstration;Handout    Comprehension  Verbalized understanding;Returned demonstration          PT Long Term Goals - 04/28/19 1258      PT LONG TERM GOAL #1   Title  Ind with HEP to prevent further pain and spasm    Time  6    Period  Weeks    Status  New    Target Date  06/09/19      PT LONG TERM GOAL #2   Title  Patient to report decreased pain in her right mid back by 75% or more with ADLS.    Time  6    Period  Weeks    Status  New    Target Date  06/09/19      PT LONG TERM GOAL #3   Title  Patient to demo negative Thomas test in LLE.    Time  6    Period  Weeks    Status  New      PT LONG TERM GOAL #4   Title  Patient able to ride in the car for greater than 1 hour without pain.    Time  6    Period  Weeks    Status  New  Plan - 04/28/19 1247    Clinical Impression Statement  Patient presents today with complaints of chronic right-sided upper lumbar pain which she has had since her mid teens. Pain is an intermittent ache which can be eased with stretching but is persistent. Patientt has tightness in bil hip flexors left greater than right as well as tightness in her right lumbar paraspinals and QL. She has decreased lumbar rotation bil by 50%. Patient responded well to DN in right lumbar spine today. She will  benefit from further PT to decrease spasm and pain with ADLS. Patient is presently undergoing neurologic testing to address N/T in bil feet, hands and left upper back.    Personal Factors and Comorbidities  Comorbidity 1    Comorbidities  N/T in feet, hands and left upper back    Stability/Clinical Decision Making  Evolving/Moderate complexity    Clinical Decision Making  Low    Rehab Potential  Excellent    PT Frequency  2x / week    PT Duration  6 weeks   8 visits max   PT Treatment/Interventions  ADLs/Self Care Home Management;Cryotherapy;Electrical Stimulation;Iontophoresis 4mg /ml Dexamethasone;Moist Heat;Traction;Ultrasound;Therapeutic exercise;Neuromuscular re-education;Patient/family education;Manual techniques;Dry needling;Taping;Spinal Manipulations;Joint Manipulations    PT Next Visit Plan  assess DN may need to DN hip flexor and lumbar mulitfidi,  hip flexor release; hip flexibility; lumbar/thoracic mobility    PT Home Exercise Plan  [phone]       Patient will benefit from skilled therapeutic intervention in order to improve the following deficits and impairments:  Decreased range of motion, Pain, Impaired flexibility, Decreased strength, Postural dysfunction  Visit Diagnosis: Chronic right-sided low back pain without sciatica - Plan: PT plan of care cert/re-cert     Problem List Patient Active Problem List   Diagnosis Date Noted  . Paresthesias 04/15/2019  . Myofascial pain 04/15/2019  . Anxiety state 02/12/2018  . Arthralgia of left temporomandibular joint 12/24/2017  . Chronic bilateral back pain 09/12/2016    Elianne Gubser 04/28/2019, 1:18 PM  [facility] [place], [place], [address] Phone: [phone]   Fax:  [phone]  Name: CHELLSEA BARTOLOMEI MRN: [id number] Date of Birth: 1985/06/27

## 2019-05-07 ENCOUNTER — Ambulatory Visit (INDEPENDENT_AMBULATORY_CARE_PROVIDER_SITE_OTHER): Payer: BC Managed Care – PPO | Admitting: Physical Therapy

## 2019-05-07 ENCOUNTER — Encounter: Payer: Self-pay | Admitting: Physical Therapy

## 2019-05-07 ENCOUNTER — Other Ambulatory Visit: Payer: Self-pay

## 2019-05-07 DIAGNOSIS — M545 Low back pain: Secondary | ICD-10-CM | POA: Diagnosis not present

## 2019-05-07 DIAGNOSIS — G8929 Other chronic pain: Secondary | ICD-10-CM

## 2019-05-07 NOTE — Therapy (Signed)
[place], [place], [address] Phone: [phone]   Fax:  [phone]  Physical Therapy Treatment  Patient Details  Name: Brittney Mays MRN: [id number] Date of Birth: 09/15/1985 Referring Provider (PT): Sarina Ill MD   Encounter Date: 05/07/2019  PT End of Session - 05/07/19 0929    Visit Number  2    Number of Visits  8    Date for PT Re-Evaluation  06/09/19    PT Start Time  0930    PT Stop Time  1017    PT Time Calculation (min)  47 min    Activity Tolerance  Patient tolerated treatment well    Behavior During Therapy  [facility] for tasks assessed/performed       Past Medical History:  Diagnosis Date  . Chronic back pain   . Knee pain     Past Surgical History:  Procedure Laterality Date  . WISDOM TOOTH EXTRACTION      There were no vitals filed for this visit.  Subjective Assessment - 05/07/19 0932    Subjective  Patient states she hasn't had any pain but is still tight in right low back. She is sore in the left hip which may be from stretches. The tingling in the arm has improved. Still having some tingling in her back and right big toe.    Pertinent History  undergoing testing for N/T  Jan 28th    Patient Stated Goals  get rid of pain    Currently in Pain?  Yes    Pain Score  2     Pain Location  Back    Pain Orientation  Right    Pain Descriptors / Indicators  Aching    Pain Type  Chronic pain         [facility] PT Assessment - 05/07/19 0001      Special Tests   Other special tests  negative FABER left hip                   [facility] Adult PT Treatment/Exercise - 05/07/19 0001      Exercises   Exercises  Knee/Hip      Knee/Hip Exercises: Stretches   Hip Flexor Stretch  Left;2 reps;30 seconds    ITB Stretch Limitations  attempted with strap and SDLY in quad stretch posiition    Other Knee/Hip Stretches  adductor stretch with strap 2 x 30 sec bil ; Frog stretch  2x30 sec and demo with bolster   tighter on left   Other Knee/Hip Stretches  butterfly stretch x 10 sec and standing hip ADD stretch x 10 sec each way      Manual Therapy   Manual Therapy  Soft tissue mobilization;Joint mobilization    Manual therapy comments  skilled palpation and monitoring of soft tissue during DN     Joint Mobilization  PA mobs unilateral to bil lumbar gd II/III; left hip ant mobs gd III; also hip distraction and long leg traction.    Soft tissue mobilization  to right lumbar paraspinals       Trigger Point Dry Needling - 05/07/19 0001    Consent Given?  Yes    Education Handout Provided  Previously provided    Muscles Treated Back/Hip  Erector spinae;Lumbar multifidi    Dry Needling Comments  right    Erector spinae Response  Palpable increased muscle length;Twitch response elicited    Lumbar multifidi Response  Twitch response elicited;Palpable  increased muscle length                PT Long Term Goals - 05/07/19 1031      PT LONG TERM GOAL #1   Title  Ind with HEP to prevent further pain and spasm    Status  On-going      PT LONG TERM GOAL #2   Title  Patient to report decreased pain in her right mid back by 75% or more with ADLS.    Status  On-going      PT LONG TERM GOAL #3   Title  Patient to demo negative Thomas test in LLE.    Status  On-going            Plan - 05/07/19 1024    Clinical Impression Statement  Patient is progressing with pain control. She still is having significant tightness in right lumbar and left hip. She has negative special tests in hip but c/o pain in bil hip adductor pelvis, so we worked on various stretches here. She responded very well to DN in lumbar spine today and had palpable decrease in tissue tension at end of treatment.    Personal Factors and Comorbidities  Comorbidity 1    Comorbidities  N/T in feet, hands and left upper back    PT Treatment/Interventions  ADLs/Self Care Home  Management;Cryotherapy;Electrical Stimulation;Iontophoresis 4mg /ml Dexamethasone;Moist Heat;Traction;Ultrasound;Therapeutic exercise;Neuromuscular re-education;Patient/family education;Manual techniques;Dry needling;Taping;Spinal Manipulations;Joint Manipulations    PT Next Visit Plan  assess DN, may need to DN left hip flexor, ADDuctors; hip flexibility; lumbar/thoracic mobility    PT Home Exercise Plan  [phone], added frog stretch       Patient will benefit from skilled therapeutic intervention in order to improve the following deficits and impairments:  Decreased range of motion, Pain, Impaired flexibility, Decreased strength, Postural dysfunction  Visit Diagnosis: Chronic right-sided low back pain without sciatica     Problem List Patient Active Problem List   Diagnosis Date Noted  . Paresthesias 04/15/2019  . Myofascial pain 04/15/2019  . Anxiety state 02/12/2018  . Arthralgia of left temporomandibular joint 12/24/2017  . Chronic bilateral back pain 09/12/2016    Madelyn Flavors PT 05/07/2019, 10:35 AM  [facility] [place], [place], [address] Phone: [phone]   Fax:  [phone]  Name: Brittney Mays MRN: [id number] Date of Birth: 06-Jun-1985

## 2019-05-12 ENCOUNTER — Other Ambulatory Visit: Payer: Self-pay

## 2019-05-12 ENCOUNTER — Encounter: Payer: Self-pay | Admitting: Physical Therapy

## 2019-05-12 ENCOUNTER — Ambulatory Visit (INDEPENDENT_AMBULATORY_CARE_PROVIDER_SITE_OTHER): Payer: BC Managed Care – PPO | Admitting: Physical Therapy

## 2019-05-12 DIAGNOSIS — M545 Low back pain, unspecified: Secondary | ICD-10-CM

## 2019-05-12 DIAGNOSIS — G8929 Other chronic pain: Secondary | ICD-10-CM | POA: Diagnosis not present

## 2019-05-12 NOTE — Therapy (Signed)
[place], [place], [address] Phone: [phone]   Fax:  [phone]  Physical Therapy Treatment  Patient Details  Name: Brittney Mays MRN: [id number] Date of Birth: Apr 17, 1986 Referring Provider (PT): Sarina Ill MD   Encounter Date: 05/12/2019  PT End of Session - 05/12/19 0848    Visit Number  3    Number of Visits  8    Date for PT Re-Evaluation  06/09/19    PT Start Time  0849    PT Stop Time  0932    PT Time Calculation (min)  43 min    Activity Tolerance  Patient tolerated treatment well    Behavior During Therapy  [facility] for tasks assessed/performed       Past Medical History:  Diagnosis Date  . Chronic back pain   . Knee pain     Past Surgical History:  Procedure Laterality Date  . WISDOM TOOTH EXTRACTION      There were no vitals filed for this visit.  Subjective Assessment - 05/12/19 0850    Subjective  Feeling better since last session. Still having some stiffness in her right low back. Left hip is feeling better. She had one incidence of feeling pain in her left hip flexor after sitting in her car.    Patient Stated Goals  get rid of pain    Currently in Pain?  Yes    Pain Score  1     Pain Location  Back    Pain Orientation  Right    Pain Descriptors / Indicators  --   stiff   Pain Type  Chronic pain                       [facility] Adult PT Treatment/Exercise - 05/12/19 0001      Knee/Hip Exercises: Stretches   Hip Flexor Stretch  Left;2 reps;30 seconds   with right SB and heel raises    Hip Flexor Stretch Limitations  standing lunge position    Other Knee/Hip Stretches  Standing ADD x 10 each way at TM; attempted various QL stretches in supine and sitting but most effective is combo hip flexor stretch    Other Knee/Hip Stretches  frog 2x30 sec; butter fly x 30 sec       Knee/Hip Exercises: Aerobic   Elliptical  L3 x 3 min      Knee/Hip  Exercises: Supine   Bridges  Both;10 reps    Bridges Limitations  on green ball  5 sec hold      Knee/Hip Exercises: Sidelying   Other Sidelying Knee/Hip Exercises  side plank 3x 5 sec hold ea    Other Sidelying Knee/Hip Exercises  open book 2 x 30 sec hold with top leg extended      Knee/Hip Exercises: Prone   Other Prone Exercises  plank 3x10 sec      Manual Therapy   Manual Therapy  Joint mobilization    Joint Mobilization  unilateral PA mobs with overpressure on right with press ups              PT Education - 05/12/19 0945    Education Details  HEP Progressed    Person(s) Educated  Patient    Methods  Explanation;Demonstration;Handout    Comprehension  Verbalized understanding;Returned demonstration          PT Long Term Goals - 05/07/19 1031      PT  LONG TERM GOAL #1   Title  Ind with HEP to prevent further pain and spasm    Status  On-going      PT LONG TERM GOAL #2   Title  Patient to report decreased pain in her right mid back by 75% or more with ADLS.    Status  On-going      PT LONG TERM GOAL #3   Title  Patient to demo negative Thomas test in LLE.    Status  On-going            Plan - 05/12/19 0942    Clinical Impression Statement  Patient is progressing toward goals. Her back is mainly stiff today and the left hip is much better. She still had one incidence of pain after driving but it resolved when she started walking. She still has bil tightness in lumbar paraspinals but denies pain. She did well with advanced core exericses with minimal cues required. Plan to advance to standing core next visit.    Comorbidities  N/T in feet, hands and left upper back    PT Frequency  2x / week    PT Duration  6 weeks    PT Treatment/Interventions  ADLs/Self Care Home Management;Cryotherapy;Electrical Stimulation;Iontophoresis 4mg /ml Dexamethasone;Moist Heat;Traction;Ultrasound;Therapeutic exercise;Neuromuscular re-education;Patient/family education;Manual  techniques;Dry needling;Taping;Spinal Manipulations;Joint Manipulations    PT Next Visit Plan  Standing core, hip flexibility; lumbar/thoracic mobility, DN as needed.    PT Home Exercise Plan  [phone], added frog stretch       Patient will benefit from skilled therapeutic intervention in order to improve the following deficits and impairments:  Decreased range of motion, Pain, Impaired flexibility, Decreased strength, Postural dysfunction  Visit Diagnosis: Chronic right-sided low back pain without sciatica     Problem List Patient Active Problem List   Diagnosis Date Noted  . Paresthesias 04/15/2019  . Myofascial pain 04/15/2019  . Anxiety state 02/12/2018  . Arthralgia of left temporomandibular joint 12/24/2017  . Chronic bilateral back pain 09/12/2016    Brittney Mays PT 05/12/2019, 9:47 AM  [facility] [place], [place], [address] Phone: [phone]   Fax:  [phone]  Name: Brittney Mays MRN: [id number] Date of Birth: 09/02/1985

## 2019-05-12 NOTE — Patient Instructions (Signed)
Access Code: [email or web address]  URL: [email or web address]  Date: 05/12/2019  Prepared by: Madelyn Flavors   Exercises Brittney Mays on Table - 3 reps - 1 sets - 30-60 sec hold - 2x daily - 7x weekly Modified Thomas Stretch - 3 reps - 1 sets - 60 sec hold - 2-3x daily - 7x weekly Sidelying Open Book Thoracic Lumbar Rotation and Extension - 5 reps - 1 sets - 5 sec hold - 1x daily - 7x weekly Standing Hip Flexor Stretch - 2 reps - 1 sets - 2x daily - 7x weekly Side Plank on Elbow - 5 reps - 1 sets - max hold - 1x daily - 7x weekly Standard Plank - 5 reps - 1 sets - max hold - 2x daily - 7x weekly Patient Education Trigger Point Dry Needling

## 2019-05-15 ENCOUNTER — Ambulatory Visit (INDEPENDENT_AMBULATORY_CARE_PROVIDER_SITE_OTHER): Payer: BC Managed Care – PPO | Admitting: Physical Therapy

## 2019-05-15 ENCOUNTER — Other Ambulatory Visit: Payer: Self-pay

## 2019-05-15 ENCOUNTER — Encounter: Payer: Self-pay | Admitting: Physical Therapy

## 2019-05-15 DIAGNOSIS — M545 Low back pain: Secondary | ICD-10-CM | POA: Diagnosis not present

## 2019-05-15 DIAGNOSIS — G8929 Other chronic pain: Secondary | ICD-10-CM

## 2019-05-15 NOTE — Therapy (Signed)
[place], [place], [address] Phone: [phone]   Fax:  [phone]  Physical Therapy Treatment  Patient Details  Name: Brittney Mays MRN: [id number] Date of Birth: 30-Nov-1985 Referring Provider (PT): Sarina Ill MD   Encounter Date: 05/15/2019  PT End of Session - 05/15/19 1144    Visit Number  4    Number of Visits  8    Date for PT Re-Evaluation  06/09/19    PT Start Time  4801    PT Stop Time  1228    PT Time Calculation (min)  43 min    Activity Tolerance  Patient tolerated treatment well    Behavior During Therapy  [facility] for tasks assessed/performed       Past Medical History:  Diagnosis Date  . Chronic back pain   . Knee pain     Past Surgical History:  Procedure Laterality Date  . WISDOM TOOTH EXTRACTION      There were no vitals filed for this visit.  Subjective Assessment - 05/15/19 1149    Subjective  Patient reports some stiffness in her back last night and this morning but then after working this morning it loosened up. She has only done HEP yesterday and had one incidence of hip pain after sitting on couch, but it resolved once she walked a little.    Patient Stated Goals  get rid of pain    Currently in Pain?  No/denies                       [facility] Adult PT Treatment/Exercise - 05/15/19 0001      Knee/Hip Exercises: Stretches   Hip Flexor Stretch  Left;Right;1 rep   with right SB and heel raises x10   Hip Flexor Stretch Limitations  standing lunge position    Other Knee/Hip Stretches  frog 2x 30 sec; Triangle pose x 30 sec ea    Other Knee/Hip Stretches  childs pose left and right x 30 sec      Knee/Hip Exercises: Aerobic   Elliptical  L3 x 3 min      Knee/Hip Exercises: Standing   Other Standing Knee Exercises  lift chop with green theraball x 10 ea    Other Standing Knee Exercises  multifidi rot with red band mini squat position x 10 ea       Knee/Hip Exercises: Supine   Bridges  Both;10 reps    Bridges Limitations  5 sec on green ball  5 sec hold hands crossed    Other Supine Knee/Hip Exercises  lumbar flex/ext in supine x 10 and sitting x 10 to address stiffness as needed      Knee/Hip Exercises: Prone   Other Prone Exercises  Quadriped alt leg x 10 ea                  PT Long Term Goals - 05/15/19 1258      PT LONG TERM GOAL #1   Title  Ind with HEP to prevent further pain and spasm    Baseline  not fully compliant at this time    Status  Partially Met            Plan - 05/15/19 1226    Clinical Impression Statement  Patient is progressing toward goals. She demonstrates weakness in right lumbar stabilzers with TE. Still experiencing greatest tightness in Rt low back.    PT Treatment/Interventions  ADLs/Self Care Home Management;Cryotherapy;Electrical Stimulation;Iontophoresis 23m/ml Dexamethasone;Moist Heat;Traction;Ultrasound;Therapeutic exercise;Neuromuscular re-education;Patient/family education;Manual techniques;Dry needling;Taping;Spinal Manipulations;Joint Manipulations    PT Next Visit Plan  check goals; continue Standing core, hip flexibility; lumbar/thoracic mobility, DN as needed.    PT Home Exercise Plan  [phone] added frog stretch    Consulted and Agree with Plan of Care  Patient       Patient will benefit from skilled therapeutic intervention in order to improve the following deficits and impairments:  Decreased range of motion, Pain, Impaired flexibility, Decreased strength, Postural dysfunction  Visit Diagnosis: Chronic right-sided low back pain without sciatica     Problem List Patient Active Problem List   Diagnosis Date Noted  . Paresthesias 04/15/2019  . Myofascial pain 04/15/2019  . Anxiety state 02/12/2018  . Arthralgia of left temporomandibular joint 12/24/2017  . Chronic bilateral back pain 09/12/2016    JMadelyn FlavorsPT 05/15/2019, 1:03 PM  [facility] [address]Phone: [phone]  Fax:  [phone] Name: Brittney JOSEPHSONMRN: [id number] of Birth: 3September 12, 1987

## 2019-05-19 ENCOUNTER — Encounter: Payer: Self-pay | Admitting: Physical Therapy

## 2019-05-19 ENCOUNTER — Ambulatory Visit (INDEPENDENT_AMBULATORY_CARE_PROVIDER_SITE_OTHER): Payer: BC Managed Care – PPO | Admitting: Physical Therapy

## 2019-05-19 ENCOUNTER — Other Ambulatory Visit: Payer: Self-pay

## 2019-05-19 DIAGNOSIS — M545 Low back pain: Secondary | ICD-10-CM | POA: Diagnosis not present

## 2019-05-19 DIAGNOSIS — G8929 Other chronic pain: Secondary | ICD-10-CM

## 2019-05-19 NOTE — Therapy (Signed)
Brittney Mays, [place], [address] Phone: [phone]   Fax:  [phone]  Physical Therapy Treatment  Patient Details  Name: Brittney Mays MRN: [id number] Date of Birth: [birth year] Referring Provider (PT): Sarina Ill MD   Encounter Date: 05/19/2019  PT End of Session - 05/19/19 0851    Visit Number  5    Number of Visits  8    Date for PT Re-Evaluation  06/09/19    PT Start Time  0849    PT Stop Time  0936    PT Time Calculation (min)  47 min    Activity Tolerance  Patient tolerated treatment well    Behavior During Therapy  [facility] for tasks assessed/performed       Past Medical History:  Diagnosis Date  . Chronic back pain   . Knee pain     Past Surgical History:  Procedure Laterality Date  . WISDOM TOOTH EXTRACTION      There were no vitals filed for this visit.  Subjective Assessment - 05/19/19 0852    Subjective  Patient spent 2.5 hours in car Sat and Sunday and was uncomfortable in the back 3/10. Today it is 1/10.    Pertinent History  undergoing testing for N/T  Jan 28th    Patient Stated Goals  get rid of pain    Currently in Pain?  Yes    Pain Score  1     Pain Location  Back    Pain Orientation  Right    Pain Descriptors / Indicators  Aching                       [facility] Adult PT Treatment/Exercise - 05/19/19 0001      Self-Care   Self-Care  Other Self-Care Comments    Other Self-Care Comments   rolling quads/hip flexors/HS/low back  on foam roller       Knee/Hip Exercises: Stretches   Hip Flexor Stretch  Left;Right;1 rep   with right SB and heel raises x10   Hip Flexor Stretch Limitations  standing lunge position    Other Knee/Hip Stretches  Hip flexor stretch in [employer] position 2x30 sec bil    Other Knee/Hip Stretches  trunk rotation stretch bil x 45 sec with PT overpressure      Knee/Hip Exercises: Aerobic   Elliptical  L3 x 3 min      Knee/Hip Exercises: Standing   Forward Lunges  Both;5 reps    Forward Lunges Limitations  with 4# rotation bil    Other Standing Knee Exercises  lift chop with 4# x 10 ea    Other Standing Knee Exercises  standing forward bend with thoracic rotation x 10 ea way       Manual Therapy   Manual Therapy  Myofascial release;Soft tissue mobilization    Soft tissue mobilization  to bil hip ADDuctors    Myofascial Release  to proximal quads and distal hip flexors bil in hooklying             PT Education - 05/19/19 0943    Education Details  use of foam roller for STM and MFR/recommended [employer] for hip opening    Person(s) Educated  Patient    Methods  Explanation;Demonstration    Comprehension  Verbalized understanding;Returned demonstration          PT Long Term Goals - 05/19/19 0854      PT  LONG TERM GOAL #1   Title  Ind with HEP to prevent further pain and spasm    Baseline  not fully compliant at this time    Status  Partially Met      PT LONG TERM GOAL #2   Title  Patient to report decreased pain in her right mid back by 75% or more with ADLS.    Baseline  Unsure    Status  On-going      PT LONG TERM GOAL #3   Title  Patient to demo negative Thomas test in LLE.    Status  On-going      PT LONG TERM GOAL #4   Title  Patient able to ride in the car for greater than 1 hour without pain.    Status  On-going            Plan - 05/19/19 0944    Clinical Impression Statement  Patient is making slow progress toward goals. She continues to have positive [employer] and is tight in her adductors as well Rt>Lt. She reports increased low back pain with car ride of 2.5 hours. PT encouraged pt to monitor symptoms daily, try [employer] and utilize foam roller daily.    PT Treatment/Interventions  ADLs/Self Care Home Management;Cryotherapy;Electrical Stimulation;Iontophoresis 6m/ml Dexamethasone;Moist Heat;Traction;Ultrasound;Therapeutic exercise;Neuromuscular  re-education;Patient/family education;Manual techniques;Dry needling;Taping;Spinal Manipulations;Joint Manipulations    PT Next Visit Plan  Standing core, quadriped stabilization, hip flexibility; lumbar/thoracic mobility, DN as needed.    PT Home Exercise Plan  [phone] added frog stretch    Consulted and Agree with Plan of Care  Patient       Patient will benefit from skilled therapeutic intervention in order to improve the following deficits and impairments:  Decreased range of motion, Pain, Impaired flexibility, Decreased strength, Postural dysfunction  Visit Diagnosis: Chronic right-sided low back pain without sciatica     Problem List Patient Active Problem List   Diagnosis Date Noted  . Paresthesias 04/15/2019  . Myofascial pain 04/15/2019  . Anxiety state 02/12/2018  . Arthralgia of left temporomandibular joint 12/24/2017  . Chronic bilateral back pain 09/12/2016    JMadelyn FlavorsPT 05/19/2019, 9:52 AM  [facility] [address]Phone: [phone]  Fax:  [phone] Name: Brittney WAWRZYNIAKMRN: [id number] of Birth: 324-May-1987

## 2019-05-21 ENCOUNTER — Other Ambulatory Visit: Payer: Self-pay

## 2019-05-21 ENCOUNTER — Encounter: Payer: Self-pay | Admitting: Physical Therapy

## 2019-05-21 ENCOUNTER — Ambulatory Visit (INDEPENDENT_AMBULATORY_CARE_PROVIDER_SITE_OTHER): Payer: BC Managed Care – PPO | Admitting: Physical Therapy

## 2019-05-21 DIAGNOSIS — M545 Low back pain: Secondary | ICD-10-CM | POA: Diagnosis not present

## 2019-05-21 DIAGNOSIS — G8929 Other chronic pain: Secondary | ICD-10-CM

## 2019-05-21 NOTE — Therapy (Signed)
[place], [place], [address] Phone: [phone]   Fax:  [phone]  Physical Therapy Treatment  Patient Details  Name: Brittney Mays MRN: [id number] Date of Birth: Apr 02, 1986 Referring Provider (PT): Sarina Ill MD   Encounter Date: 05/21/2019  PT End of Session - 05/21/19 1614    Visit Number  6    Number of Visits  8    Date for PT Re-Evaluation  06/09/19    PT Start Time  4008    PT Stop Time  6761    PT Time Calculation (min)  40 min    Activity Tolerance  Patient tolerated treatment well;No increased pain    Behavior During Therapy  WFL for tasks assessed/performed       Past Medical History:  Diagnosis Date  . Chronic back pain   . Knee pain     Past Surgical History:  Procedure Laterality Date  . WISDOM TOOTH EXTRACTION      There were no vitals filed for this visit.  Subjective Assessment - 05/21/19 1616    Subjective  Pt is pain free today. She has been on feet most of day, not sitting. Tried rolling out her LE last night; some intense areas of quads, calves / hamstrings was awkward.    Patient Stated Goals  get rid of pain    Currently in Pain?  No/denies    Pain Score  0-No pain         [facility] PT Assessment - 05/21/19 0001      Assessment   Medical Diagnosis  myofascial pain    Referring Provider (PT)  Sarina Ill MD    Onset Date/Surgical Date  04/01/19    Next MD Visit  05/29/19    Prior Therapy  2016        [facility] Adult PT Treatment/Exercise - 05/21/19 0001      Self-Care   Other Self-Care Comments   pt educated re: frequent breaks from sitting (for work) and breaks for driving.  Educated pt re: rolling LE with stick roller; pt returned demo with cues.   Pt encouraged to keep journal to track symptoms and aggrivating factors.       Knee/Hip Exercises: Stretches   Hip Flexor Stretch  Right;Left;2 reps;20 seconds   1st reg; 2nd with overhead arm -both  seated Verbally reviewed HEP.    Other Knee/Hip Stretches  modified triangle pose x 2 reps each side.        Knee/Hip Exercises: Aerobic   Elliptical  L1: 3 min       Knee/Hip Exercises: Sidelying   Other Sidelying Knee/Hip Exercises  thoracic rotation, open book with red band x 10 reps each side.       Manual Therapy   Soft tissue mobilization  IASTM to bilat lumbar paraspinals/Rt QL to decrease fascial tightness.  TPR to Rt QL and lumbar paraspinals with pt in supine.  TPR Rt iliopsoas, only mildly tender.                  PT Long Term Goals - 05/19/19 0854      PT LONG TERM GOAL #1   Title  Ind with HEP to prevent further pain and spasm    Baseline  not fully compliant at this time    Status  Partially Met      PT LONG TERM GOAL #2   Title  Patient to report decreased pain in her  right mid back by 75% or more with ADLS.    Baseline  Unsure    Status  On-going      PT LONG TERM GOAL #3   Title  Patient to demo negative Thomas test in LLE.    Status  On-going      PT LONG TERM GOAL #4   Title  Patient able to ride in the car for greater than 1 hour without pain.    Status  On-going            Plan - 05/21/19 1656    Clinical Impression Statement  Pt reporting improved compliance with HEP.  Palpable and visable tightness in Rt lumbar paraspinals with IASTM (pt in childs pose during); slightly reduced with manual therapy.  Pt making gradual gains towards goals.    Rehab Potential  Excellent    PT Frequency  2x / week    PT Duration  6 weeks    PT Treatment/Interventions  ADLs/Self Care Home Management;Cryotherapy;Electrical Stimulation;Iontophoresis '4mg'$ /ml Dexamethasone;Moist Heat;Traction;Ultrasound;Therapeutic exercise;Neuromuscular re-education;Patient/family education;Manual techniques;Dry needling;Taping;Spinal Manipulations;Joint Manipulations    PT Next Visit Plan  Standing core, quadriped stabilization, hip flexibility; lumbar/thoracic mobility, DN as  needed.    PT Home Exercise Plan  [phone], added frog stretch    Consulted and Agree with Plan of Care  Patient       Patient will benefit from skilled therapeutic intervention in order to improve the following deficits and impairments:  Decreased range of motion, Pain, Impaired flexibility, Decreased strength, Postural dysfunction  Visit Diagnosis: Chronic right-sided low back pain without sciatica     Problem List Patient Active Problem List   Diagnosis Date Noted  . Paresthesias 04/15/2019  . Myofascial pain 04/15/2019  . Anxiety state 02/12/2018  . Arthralgia of left temporomandibular joint 12/24/2017  . Chronic bilateral back pain 09/12/2016   Kerin Perna, PTA 05/21/19 5:00 PM  [place], [place], [address] Phone: [phone]   Fax:  [phone]  Name: Brittney Mays MRN: [id number] Date of Birth: July 14, 1985

## 2019-05-26 ENCOUNTER — Encounter: Payer: Self-pay | Admitting: Physical Therapy

## 2019-05-26 ENCOUNTER — Ambulatory Visit (INDEPENDENT_AMBULATORY_CARE_PROVIDER_SITE_OTHER): Payer: BC Managed Care – PPO | Admitting: Physical Therapy

## 2019-05-26 ENCOUNTER — Telehealth: Payer: Self-pay | Admitting: Neurology

## 2019-05-26 ENCOUNTER — Other Ambulatory Visit: Payer: Self-pay

## 2019-05-26 DIAGNOSIS — M545 Low back pain, unspecified: Secondary | ICD-10-CM

## 2019-05-26 DIAGNOSIS — G8929 Other chronic pain: Secondary | ICD-10-CM

## 2019-05-26 NOTE — Therapy (Signed)
[facility] [address] , [place], [address] Phone: [phone]   Fax:  [phone]  Physical Therapy Treatment  Patient Details  Name: Brittney Mays MRN: [id number] Date of Birth: 12/13/1985 Referring Provider (PT): Antonia Ahern MD   Encounter Date: 05/26/2019  PT End of Session - 05/26/19 0851    Visit Number  7    Number of Visits  8    Date for PT Re-Evaluation  06/09/19    PT Start Time  0848    PT Stop Time  0930    PT Time Calculation (min)  42 min    Activity Tolerance  Patient tolerated treatment well;No increased pain    Behavior During Therapy  WFL for tasks assessed/performed       Past Medical History:  Diagnosis Date  . Chronic back pain   . Knee pain     Past Surgical History:  Procedure Laterality Date  . WISDOM TOOTH EXTRACTION      There were no vitals filed for this visit.  Subjective Assessment - 05/26/19 0852    Subjective  Patient denies pain but reports continued stiffness in Rt low back    Pertinent History  undergoing testing for N/T  Jan 28th    Patient Stated Goals  get rid of pain    Currently in Pain?  Yes    Pain Score  1     Pain Location  Back    Pain Orientation  Right                       [facility] Adult PT Treatment/Exercise - 05/26/19 0001      Knee/Hip Exercises: Stretches   Quad Stretch  Right;Left;1 rep;20 seconds    Quad Stretch Limitations  passive    Other Knee/Hip Stretches  triangle pose x 60 sec each side    Other Knee/Hip Stretches  trunk rotation stretch bil x 45 sec      Knee/Hip Exercises: Aerobic   Elliptical  L3 x 3      Manual Therapy   Manual Therapy  Soft tissue mobilization;Joint mobilization    Manual therapy comments  skilled palpation and monitoring of soft tissue during DN     Joint Mobilization  Gd III/IV PA mobs to bil lumbar; right ant hip mobs    Soft tissue mobilization  to right lumbar/QL       Trigger  Point Dry Needling - 05/26/19 0001    Consent Given?  Yes    Education Handout Provided  Previously provided    Muscles Treated Back/Hip  Erector spinae;Quadratus lumborum;Iliopsoas    Dry Needling Comments  r    Erector spinae Response  Twitch response elicited;Palpable increased muscle length    Iliopsoas Response  Twitch response elicited;Palpable increased muscle length    Quadratus Lumborum Response  Twitch response elicited;Palpable increased muscle length                PT Long Term Goals - 05/19/19 0854      PT LONG TERM GOAL #1   Title  Ind with HEP to prevent further pain and spasm    Baseline  not fully compliant at this time    Status  Partially Met      PT LONG TERM GOAL #2   Title  Patient to report decreased pain in her right mid back by 75% or more with ADLS.    Baseline  Unsure      Status  On-going      PT LONG TERM GOAL #3   Title  Patient to demo negative Thomas test in LLE.    Status  On-going      PT LONG TERM GOAL #4   Title  Patient able to ride in the car for greater than 1 hour without pain.    Status  On-going            Plan - 05/26/19 6387    Clinical Impression Statement  Patient still feeling stiffness in right LB. She responed very well to DN in Rt lumbar and psoas today and manual therapy. Reports some improvement in stiffness at end of session.    PT Treatment/Interventions  ADLs/Self Care Home Management;Cryotherapy;Electrical Stimulation;Iontophoresis 50m/ml Dexamethasone;Moist Heat;Traction;Ultrasound;Therapeutic exercise;Neuromuscular re-education;Patient/family education;Manual techniques;Dry needling;Taping;Spinal Manipulations;Joint Manipulations    PT Next Visit Plan  Assess need to continue, d/c or place on hold; continue lumbar/thoracic mobility, Standing core, quadriped stabilization, hip flexibility; , DN as needed.       Patient will benefit from skilled therapeutic intervention in order to improve the following  deficits and impairments:  Decreased range of motion, Pain, Impaired flexibility, Decreased strength, Postural dysfunction  Visit Diagnosis: Chronic right-sided low back pain without sciatica     Problem List Patient Active Problem List   Diagnosis Date Noted  . Paresthesias 04/15/2019  . Myofascial pain 04/15/2019  . Anxiety state 02/12/2018  . Arthralgia of left temporomandibular joint 12/24/2017  . Chronic bilateral back pain 09/12/2016    JMadelyn FlavorsPT 05/26/2019, 5:17 PM  [facility] [address]Phone: [phone]  Fax:  [phone] Name: RDALEAH COULSONMRN: [id number] of Birth: [birth year]

## 2019-05-26 NOTE — Telephone Encounter (Signed)
I called patient regarding rescheduling 1/28 NCV/EMG due to tech being out. Requested patient call back to reschedule. Appointment has been cancelled but I will attempt to contact patient once more prior to 1/28.

## 2019-05-28 ENCOUNTER — Ambulatory Visit (INDEPENDENT_AMBULATORY_CARE_PROVIDER_SITE_OTHER): Payer: BC Managed Care – PPO | Admitting: Physical Therapy

## 2019-05-28 ENCOUNTER — Other Ambulatory Visit: Payer: Self-pay

## 2019-05-28 DIAGNOSIS — G8929 Other chronic pain: Secondary | ICD-10-CM | POA: Diagnosis not present

## 2019-05-28 DIAGNOSIS — M545 Low back pain, unspecified: Secondary | ICD-10-CM

## 2019-05-28 NOTE — Patient Instructions (Signed)
Access Code: [email or web address]  URL: [email or web address]  Date: 05/28/2019  Prepared by: Madelyn Flavors   Exercises [employer] on Table - 3 reps - 1 sets - 30-60 sec hold - 2x daily - 7x weekly Seated Hip Flexor Stretch - 3 reps - 1 sets - 30-60 sec hold - 2x daily - 7x weekly Standing Hip Flexor Stretch - 2 reps - 1 sets - 2x daily - 7x weekly Sidelying Open Book Thoracic Lumbar Rotation and Extension - 5 reps - 1 sets - 5 sec hold - 1x daily - 7x weekly Side Plank on Elbow - 5 reps - 1 sets - max hold - 1x daily - 7x weekly Standard Plank - 5 reps - 1 sets - max hold - 2x daily - 7x weekly Seated Quadratus Lumborum Stretch with Arm Overhead - 3 reps                   - 1 sets - 60 sec hold - 1x daily - 7x weekly Quadruped Adductor Stretch - 3 reps - 1 sets - 60 sec hold - 1x daily - 7x weekly Patient Education Trigger Point Dry Needling

## 2019-05-28 NOTE — Therapy (Addendum)
[place], [place], [address] Phone: [phone]   Fax:  [phone]  Physical Therapy Treatment and Discharge Summary  Patient Details  Name: Brittney Mays MRN: [id number] Date of Birth: [birth year] Referring Provider (PT): Sarina Ill MD   Encounter Date: 05/28/2019  PT End of Session - 05/28/19 0855    Visit Number  8    Number of Visits  8    Date for PT Re-Evaluation  06/09/19    PT Start Time  0850    PT Stop Time  0933    PT Time Calculation (min)  43 min    Activity Tolerance  Patient tolerated treatment well;No increased pain    Behavior During Therapy  WFL for tasks assessed/performed       Past Medical History:  Diagnosis Date  . Chronic back pain   . Knee pain     Past Surgical History:  Procedure Laterality Date  . WISDOM TOOTH EXTRACTION      There were no vitals filed for this visit.  Subjective Assessment - 05/28/19 0855    Subjective  Feeling better since last visit. Still having some soreness in right back.    Patient Stated Goals  get rid of pain    Currently in Pain?  No/denies                       [facility] Adult PT Treatment/Exercise - 05/28/19 0001      Self-Care   Self-Care  Other Self-Care Comments    Other Self-Care Comments   Discussed placing on hold vs. D/C; making sure pt schedules time to care for her back during increased work demands.       Knee/Hip Exercises: Stretches   Hip Flexor Stretch  Both;1 rep;20 seconds    Hip Flexor Stretch Limitations  thomas position    Other Knee/Hip Stretches  seated cross legged QL stretch done in varying positions to find best stretch;    Other Knee/Hip Stretches  trunk rotation stretch bil x 20 sec      Knee/Hip Exercises: Aerobic   Elliptical  L3 x 3      Manual Therapy   Manual Therapy  Soft tissue mobilization;Joint mobilization    Joint Mobilization  Gd III/IV PA mobs to bil lumbar    Soft tissue mobilization  IASTM to bil lumbar/QL in prone and left [facility]             PT Education - 05/28/19 1329    Education Details  Reviewed and organized/updated HEP    Person(s) Educated  Patient    Methods  Explanation;Demonstration;Handout    Comprehension  Verbalized understanding;Returned demonstration          PT Long Term Goals - 05/28/19 0856      PT LONG TERM GOAL #1   Title  Ind with HEP to prevent further pain and spasm    Status  Achieved      PT LONG TERM GOAL #2   Title  Patient to report decreased pain in her right mid back by 75% or more with ADLS.    Status  Achieved      PT LONG TERM GOAL #3   Title  Patient to demo negative Thomas test in LLE.    Status  Partially Met      PT LONG TERM GOAL #4   Title  Patient able to ride in the car for greater  than 1 hour without pain.    Status  Achieved            Plan - 05/28/19 1325    Clinical Impression Statement  Patient has met or partially met her LTGs and reports improved mobility in her spine today. She still has tight hip flexors and tight erector spinae muscles, but continues to do her stretching regularly. Patient advised to maintain compliance with HEP to continue to gain flexibilty.    PT Treatment/Interventions  ADLs/Self Care Home Management;Cryotherapy;Electrical Stimulation;Iontophoresis '4mg'$ /ml Dexamethasone;Moist Heat;Traction;Ultrasound;Therapeutic exercise;Neuromuscular re-education;Patient/family education;Manual techniques;Dry needling;Taping;Spinal Manipulations;Joint Manipulations    PT Next Visit Plan  Placing pt on hold for 30 days per her request. D/C after 06/25/19 if she does not return to PT.    PT Home Exercise Plan  [phone], added frog stretch    Consulted and Agree with Plan of Care  Patient       Patient will benefit from skilled therapeutic intervention in order to improve the following deficits and impairments:  Decreased range of motion, Pain, Impaired flexibility,  Decreased strength, Postural dysfunction  Visit Diagnosis: Chronic right-sided low back pain without sciatica     Problem List Patient Active Problem List   Diagnosis Date Noted  . Paresthesias 04/15/2019  . Myofascial pain 04/15/2019  . Anxiety state 02/12/2018  . Arthralgia of left temporomandibular joint 12/24/2017  . Chronic bilateral back pain 09/12/2016    Madelyn Flavors PT 05/28/2019, 1:35 PM  [facility] [place], [place], [address] Phone: [phone]   Fax:  [phone]  Name: Brittney Mays MRN: [id number] Date of Birth: 30-Nov-1985  PHYSICAL THERAPY DISCHARGE SUMMARY  Visits from Start of Care: 8  Current functional level related to goals / functional outcomes: See Above   Remaining deficits: See above   Education / Equipment: HEP  Plan: Patient agrees to discharge.  Patient goals were partially met. Patient is being discharged due to being pleased with the current functional level.  ?????    Madelyn Flavors, PT 08/21/19 3:10 PM  [facility] [address], [place] [address]  [phone] (office) [phone] (fax)

## 2019-05-29 ENCOUNTER — Encounter: Payer: BC Managed Care – PPO | Admitting: Neurology

## 2019-06-16 ENCOUNTER — Telehealth: Payer: Self-pay | Admitting: Neurology

## 2019-06-16 NOTE — Telephone Encounter (Signed)
Spoke with Dr. 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 Pt should be advised to see PCP. If she has any acute episodes of double vision, loss of vision she should seek emergency medical evaluation.

## 2019-06-16 NOTE — Telephone Encounter (Signed)
Pt called and LVM stating that she is having vision issues and is wanting to speak to the RN to discuss this. Please advise.

## 2019-06-16 NOTE — Telephone Encounter (Signed)
Spoke with pt. She says it's cleared up now. She also spoke with a nurse at her primary care. She stated around 12:15-12:50 pm today she experienced "rainbow squigglys" towards middle field of vision. For 45 minutes made its way in a C shape in field of vision off to the L until she couldn't see it anymore. She has a headache now but otherwise she reports she is ok. I asked pt if she had any history of any visual auras with headache and she denied this. She stated she googled her symptoms and aura with headache kept coming up. I advised the pt if she has any noted vision loss or black spots in field of vision to get to a hospital/call 911 right away. I also let her know I would send this message to Dr. Jaynee Eagles and with this new issue she may advise pt get a work-up with primary care first and if needed send to neurology. The pt verbalized appreciation for the call.

## 2019-06-16 NOTE — Telephone Encounter (Signed)
I called Brittney Mays and let her know Dr. Jaynee Eagles was not concerned by what Brittney Mays described to [place] and that this sounds like migraine with aura. Brittney Mays understands in event of acute double vision (especially if it gets better when one eye closes) or any loss of vision to seek emergency help. I advised her since this is new to let primary care know so they can see her if needed and if they feel neurology is needed specifically they can send a referral. She verbalized appreciation for the call.

## 2019-06-23 ENCOUNTER — Telehealth: Payer: Self-pay | Admitting: Neurology

## 2019-06-23 NOTE — Telephone Encounter (Signed)
Called patient and LVM to reschedule 3/4 NCV/EMG due to MD out of office for meeting. Requested patient call back to reschedule. FYI

## 2019-07-03 ENCOUNTER — Encounter: Payer: BC Managed Care – PPO | Admitting: Neurology

## 2019-08-06 ENCOUNTER — Encounter: Payer: Self-pay | Admitting: Medical-Surgical

## 2019-08-07 ENCOUNTER — Ambulatory Visit: Payer: BC Managed Care – PPO | Admitting: Neurology

## 2019-08-07 ENCOUNTER — Other Ambulatory Visit: Payer: Self-pay

## 2019-08-07 ENCOUNTER — Ambulatory Visit (INDEPENDENT_AMBULATORY_CARE_PROVIDER_SITE_OTHER): Payer: BC Managed Care – PPO | Admitting: Neurology

## 2019-08-07 DIAGNOSIS — G8929 Other chronic pain: Secondary | ICD-10-CM

## 2019-08-07 DIAGNOSIS — R29818 Other symptoms and signs involving the nervous system: Secondary | ICD-10-CM

## 2019-08-07 DIAGNOSIS — R202 Paresthesia of skin: Secondary | ICD-10-CM

## 2019-08-07 DIAGNOSIS — M545 Low back pain, unspecified: Secondary | ICD-10-CM

## 2019-08-07 DIAGNOSIS — R200 Anesthesia of skin: Secondary | ICD-10-CM | POA: Diagnosis not present

## 2019-08-07 DIAGNOSIS — Z0289 Encounter for other administrative examinations: Secondary | ICD-10-CM

## 2019-08-07 DIAGNOSIS — R29898 Other symptoms and signs involving the musculoskeletal system: Secondary | ICD-10-CM | POA: Diagnosis not present

## 2019-08-07 DIAGNOSIS — R2990 Unspecified symptoms and signs involving the nervous system: Secondary | ICD-10-CM | POA: Diagnosis not present

## 2019-08-10 NOTE — Progress Notes (Signed)
Full Name: Brittney Mays Gender: Female MRN #: [id number] Date of Birth: [birth year]    Visit Date: 08/07/2019 07:24 Age: 34 Years Examining Physician: Sarina Ill, MD  Referring Physician: Emeterio Reeve, DO Height: 5 feet 7 inch    History: Numbness and tingling in the fingers, paresthesias in the legs and arms, chronic low back pain.  Summary: EMG/NCS performed on the bilateral upper extremities and left lower extremity. All nerves and muscles (as indicated in the following tables) were within normal limits.     Conclusion: This is a normal study. No peripheral nerve etiology found to explain symptoms.     ------------------------------- Sarina Ill M.D.  [facility] [address] [place], [place] [address] Tel: [phone] Fax: [phone]         [facility]    Nerve / Sites Muscle Latency Ref. Amplitude Ref. Rel Amp Segments Distance Velocity Ref. Area    ms ms mV mV %  cm m/s m/s mVms  L Median - APB     Wrist APB 3.2 ?4.4 6.3 ?4.0 100 Wrist - APB 7   25.8     Upper arm APB 7.1  7.6  120 Upper arm - Wrist 23 58 ?49 34.9     Ulnar Wrist APB 3.8  5.6  74.7 Ulnar Wrist - APB    25.0     Ulnar B. Elbow APB 7.5  4.7  82.6 Ulnar B. Elbow - Ulnar Wrist    17.3     Ulnar A. Elbow APB 8.6  4.8  103 Ulnar A. Elbow - Ulnar B. Elbow    18.1  R Median - APB     Wrist APB 3.2 ?4.4 8.6 ?4.0 100 Wrist - APB 7   32.7     Upper arm APB 7.0  8.2  95 Upper arm - Wrist 23 60 ?49 39.7  L Ulnar - ADM     Wrist ADM 2.8 ?3.3 15.3 ?6.0 100 Wrist - ADM 7   45.0     B.Elbow ADM 6.1  11.3  74.1 B.Elbow - Wrist 21 63 ?49 39.7     A.Elbow ADM 7.4  11.3  99.8 A.Elbow - B.Elbow 8 63 ?49 39.4  R Ulnar - ADM     Wrist ADM 2.4 ?3.3 12.1 ?6.0 100 Wrist - ADM 7   41.5     B.Elbow ADM 5.9  8.5  70.2 B.Elbow - Wrist 22 61 ?49 32.3     A.Elbow ADM 7.4  8.1  95.9 A.Elbow - B.Elbow 8 56 ?49 31.7  L Peroneal - EDB     Ankle EDB 4.4 ?6.5 5.7 ?2.0 100 Ankle - EDB 9    19.4     Fib head EDB 10.5  5.7  99.3 Fib head - Ankle 31 51 ?44 18.7     Pop fossa EDB 12.4  5.7  100 Pop fossa - Fib head 10 54 ?44 18.7         Pop fossa - Ankle      L Tibial - AH     Ankle AH 3.5 ?5.8 15.0 ?4.0 100 Ankle - AH 9   32.4     Pop fossa AH 11.9  10.1  67.5 Pop fossa - Ankle 39 46 ?41 32.1                 SNC    Nerve / Sites Rec. Site Peak Lat Ref.  Amp Ref.  Segments Distance Peak Diff Ref.    ms ms V V  cm ms ms  L Sural - Ankle (Calf)     Calf Ankle 3.8 ?4.4 7 ?6 Calf - Ankle 14    L Superficial peroneal - Ankle     Lat leg Ankle 4.1 ?4.4 7 ?6 Lat leg - Ankle 14    L Median, Ulnar - Transcarpal comparison     Median Palm Wrist 1.9 ?2.2 73 ?35 Median Palm - Wrist 8       Ulnar Palm Wrist 2.0 ?2.2 33 ?12 Ulnar Palm - Wrist 8          Median Palm - Ulnar Palm  -0.0 ?0.4  R Median, Ulnar - Transcarpal comparison     Median Palm Wrist 2.1 ?2.2 94 ?35 Median Palm - Wrist 8       Ulnar Palm Wrist 1.7 ?2.2 52 ?12 Ulnar Palm - Wrist 8          Median Palm - Ulnar Palm  0.4 ?0.4  L Median - Orthodromic (Dig II, Mid palm)     Dig II Wrist 2.9 ?3.4 21 ?10 Dig II - Wrist 13    R Median - Orthodromic (Dig II, Mid palm)     Dig II Wrist 2.9 ?3.4 21 ?10 Dig II - Wrist 13    L Ulnar - Orthodromic, (Dig V, Mid palm)     Dig V Wrist 2.7 ?3.1 14 ?5 Dig V - Wrist 11    R Ulnar - Orthodromic, (Dig V, Mid palm)     Dig V Wrist 2.3 ?3.1 10 ?5 Dig V - Wrist 50                       F  Wave    Nerve F Lat Ref.   ms ms  L Tibial - AH 52.2 ?56.0  L Ulnar - ADM 27.7 ?32.0  R Ulnar - ADM 28.9 ?32.0           EMG Summary Table    Spontaneous MUAP Recruitment  Muscle IA Fib PSW Fasc Other Amp Dur. Poly Pattern  L. Deltoid Normal None None None _______ Normal Normal Normal Normal  L. Triceps brachii Normal None None None _______ Normal Normal Normal Normal  L. Pronator teres Normal None None None _______ Normal Normal Normal Normal  L. Opponens pollicis Normal None None None  _______ Normal Normal Normal Normal  L. First dorsal interosseous Normal None None None _______ Normal Normal Normal Normal  L. Cervical paraspinals (low) Normal None None None _______ Normal Normal Normal Normal  L. Vastus medialis Normal None None None _______ Normal Normal Normal Normal  L. Tibialis anterior Normal None None None _______ Normal Normal Normal Normal  L. Gastrocnemius (Medial head) Normal None None None _______ Normal Normal Normal Normal  L. Extensor hallucis longus Normal None None None _______ Normal Normal Normal Normal  L. Abductor hallucis Normal None None None _______ Normal Normal Normal Normal

## 2019-08-10 NOTE — Procedures (Signed)
Full Name: Brittney Mays Gender: Female MRN #: [id number] Date of Birth: Sep 29, 1985    Visit Date: 08/07/2019 07:24 Age: 34 Years Examining Physician: Sarina Ill, MD  Referring Physician: Emeterio Reeve, DO Height: 5 feet 7 inch    History: Numbness and tingling in the fingers, paresthesias in the legs and arms, chronic low back pain.  Summary: EMG/NCS performed on the bilateral upper extremities and left lower extremity. All nerves and muscles (as indicated in the following tables) were within normal limits.     Conclusion: This is a normal study. No peripheral nerve etiology found to explain symptoms.     ------------------------------- Sarina Ill M.D.  [facility] [address] [place], [place] [address] Tel: [phone] Fax: [phone]         [facility]    Nerve / Sites Muscle Latency Ref. Amplitude Ref. Rel Amp Segments Distance Velocity Ref. Area    ms ms mV mV %  cm m/s m/s mVms  L Median - APB     Wrist APB 3.2 ?4.4 6.3 ?4.0 100 Wrist - APB 7   25.8     Upper arm APB 7.1  7.6  120 Upper arm - Wrist 23 58 ?49 34.9     Ulnar Wrist APB 3.8  5.6  74.7 Ulnar Wrist - APB    25.0     Ulnar B. Elbow APB 7.5  4.7  82.6 Ulnar B. Elbow - Ulnar Wrist    17.3     Ulnar A. Elbow APB 8.6  4.8  103 Ulnar A. Elbow - Ulnar B. Elbow    18.1  R Median - APB     Wrist APB 3.2 ?4.4 8.6 ?4.0 100 Wrist - APB 7   32.7     Upper arm APB 7.0  8.2  95 Upper arm - Wrist 23 60 ?49 39.7  L Ulnar - ADM     Wrist ADM 2.8 ?3.3 15.3 ?6.0 100 Wrist - ADM 7   45.0     B.Elbow ADM 6.1  11.3  74.1 B.Elbow - Wrist 21 63 ?49 39.7     A.Elbow ADM 7.4  11.3  99.8 A.Elbow - B.Elbow 8 63 ?49 39.4  R Ulnar - ADM     Wrist ADM 2.4 ?3.3 12.1 ?6.0 100 Wrist - ADM 7   41.5     B.Elbow ADM 5.9  8.5  70.2 B.Elbow - Wrist 22 61 ?49 32.3     A.Elbow ADM 7.4  8.1  95.9 A.Elbow - B.Elbow 8 56 ?49 31.7  L Peroneal - EDB     Ankle EDB 4.4 ?6.5 5.7 ?2.0 100 Ankle - EDB 9    19.4     Fib head EDB 10.5  5.7  99.3 Fib head - Ankle 31 51 ?44 18.7     Pop fossa EDB 12.4  5.7  100 Pop fossa - Fib head 10 54 ?44 18.7         Pop fossa - Ankle      L Tibial - AH     Ankle AH 3.5 ?5.8 15.0 ?4.0 100 Ankle - AH 9   32.4     Pop fossa AH 11.9  10.1  67.5 Pop fossa - Ankle 39 46 ?41 32.1                 SNC    Nerve / Sites Rec. Site Peak Lat Ref.  Amp Ref.  Segments Distance Peak Diff Ref.    ms ms V V  cm ms ms  L Sural - Ankle (Calf)     Calf Ankle 3.8 ?4.4 7 ?6 Calf - Ankle 14    L Superficial peroneal - Ankle     Lat leg Ankle 4.1 ?4.4 7 ?6 Lat leg - Ankle 14    L Median, Ulnar - Transcarpal comparison     Median Palm Wrist 1.9 ?2.2 73 ?35 Median Palm - Wrist 8       Ulnar Palm Wrist 2.0 ?2.2 33 ?12 Ulnar Palm - Wrist 8          Median Palm - Ulnar Palm  -0.0 ?0.4  R Median, Ulnar - Transcarpal comparison     Median Palm Wrist 2.1 ?2.2 94 ?35 Median Palm - Wrist 8       Ulnar Palm Wrist 1.7 ?2.2 52 ?12 Ulnar Palm - Wrist 8          Median Palm - Ulnar Palm  0.4 ?0.4  L Median - Orthodromic (Dig II, Mid palm)     Dig II Wrist 2.9 ?3.4 21 ?10 Dig II - Wrist 13    R Median - Orthodromic (Dig II, Mid palm)     Dig II Wrist 2.9 ?3.4 21 ?10 Dig II - Wrist 13    L Ulnar - Orthodromic, (Dig V, Mid palm)     Dig V Wrist 2.7 ?3.1 14 ?5 Dig V - Wrist 11    R Ulnar - Orthodromic, (Dig V, Mid palm)     Dig V Wrist 2.3 ?3.1 10 ?5 Dig V - Wrist 33                       F  Wave    Nerve F Lat Ref.   ms ms  L Tibial - AH 52.2 ?56.0  L Ulnar - ADM 27.7 ?32.0  R Ulnar - ADM 28.9 ?32.0           EMG Summary Table    Spontaneous MUAP Recruitment  Muscle IA Fib PSW Fasc Other Amp Dur. Poly Pattern  L. Deltoid Normal None None None _______ Normal Normal Normal Normal  L. Triceps brachii Normal None None None _______ Normal Normal Normal Normal  L. Pronator teres Normal None None None _______ Normal Normal Normal Normal  L. Opponens pollicis Normal None None None  _______ Normal Normal Normal Normal  L. First dorsal interosseous Normal None None None _______ Normal Normal Normal Normal  L. Cervical paraspinals (low) Normal None None None _______ Normal Normal Normal Normal  L. Vastus medialis Normal None None None _______ Normal Normal Normal Normal  L. Tibialis anterior Normal None None None _______ Normal Normal Normal Normal  L. Gastrocnemius (Medial head) Normal None None None _______ Normal Normal Normal Normal  L. Extensor hallucis longus Normal None None None _______ Normal Normal Normal Normal  L. Abductor hallucis Normal None None None _______ Normal Normal Normal Normal

## 2019-08-10 NOTE — Progress Notes (Signed)
See procedure note.

## 2019-08-10 NOTE — Progress Notes (Signed)
Patient with multiple neurologic symptoms. EMG/NCS negative for peripheral causes, we need MRI brain and cervical spine to evaluate for multiple sclerosis. We discussed this, discussed imaging will order.  I spent 15 minutes of face-to-face and non-face-to-face time with patient on the  1. Multiple neurological symptoms   2. Numbness and tingling   3. Limb weakness   4. Chronic bilateral low back pain, unspecified whether sciatica present   5. Numbness and tingling of hand   6. Paresthesias   7. Other symptoms and signs involving the nervous system    diagnosis.  This included previsit chart review, lab review, study review, order entry, electronic health record documentation, patient education on the different diagnostic and therapeutic options, counseling and coordination of care, risks and benefits of management, compliance, or risk factor reduction. This does not include time spent on emg/ncs.  Orders Placed This Encounter  Procedures  . MR BRAIN W WO CONTRAST  . MR CERVICAL SPINE W WO CONTRAST

## 2019-08-12 ENCOUNTER — Other Ambulatory Visit: Payer: Self-pay | Admitting: Neurology

## 2019-08-12 ENCOUNTER — Telehealth: Payer: Self-pay | Admitting: Neurology

## 2019-08-12 MED ORDER — ALPRAZOLAM 0.25 MG PO TABS: ORAL_TABLET | ORAL | 0 refills | Status: DC

## 2019-08-12 NOTE — Telephone Encounter (Signed)
Pt states the Tuesday morning apt would be best if still available and a VM can be left if unable to reach her

## 2019-08-12 NOTE — Telephone Encounter (Signed)
Left a voicemail informing the patient a xanax has been sent to her pharmacy.

## 2019-08-12 NOTE — Telephone Encounter (Signed)
Please let patient know I did call it in thanks!

## 2019-08-12 NOTE — Telephone Encounter (Signed)
Patient is scheduled at [facility] for 08/19/19. I left her a voicemail to arrive at 10:45 AM.  She also stated she is claustrophic and would need something. She is aware to have a driver.

## 2019-08-12 NOTE — Telephone Encounter (Signed)
spoke to the patient she [place]: [id number] (exp. 08/12/19 to 02/07/20)

## 2019-08-19 ENCOUNTER — Other Ambulatory Visit: Payer: Self-pay

## 2019-08-19 ENCOUNTER — Ambulatory Visit: Payer: BC Managed Care – PPO

## 2019-08-19 DIAGNOSIS — R200 Anesthesia of skin: Secondary | ICD-10-CM

## 2019-08-19 DIAGNOSIS — R202 Paresthesia of skin: Secondary | ICD-10-CM | POA: Diagnosis not present

## 2019-08-19 DIAGNOSIS — R29898 Other symptoms and signs involving the musculoskeletal system: Secondary | ICD-10-CM

## 2019-08-19 DIAGNOSIS — R29818 Other symptoms and signs involving the nervous system: Secondary | ICD-10-CM

## 2019-08-19 DIAGNOSIS — R2990 Unspecified symptoms and signs involving the nervous system: Secondary | ICD-10-CM

## 2019-08-19 MED ORDER — GADOBENATE DIMEGLUMINE 529 MG/ML IV SOLN
15.0000 mL | Freq: Once | INTRAVENOUS | Status: AC | PRN
  Administered 2019-08-19: 12:00:00 15 mL via INTRAVENOUS

## 2019-08-21 ENCOUNTER — Telehealth: Payer: Self-pay | Admitting: *Deleted

## 2019-08-21 NOTE — Telephone Encounter (Signed)
I called pt. She had seen the message/results on mychart from Dr. Jaynee Eagles. Her questions were answered. She wasn't sure if she needed an appt to discuss the syrinx since Dr. Jaynee Eagles wasn't concerned. She was curious if she needed follow up or return to PCP. I let her know as of now per the office note plan the work up has been completed with labs, EMG and MRI but happy to schedule follow up or ask Dr. Jaynee Eagles further if pt is to return to PCP or f/u again. Pt prefers to think on it and will let us know at a later date if she would like an appt or to see if she can return to PCP. She verbalized appreciation for the call.
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
## 2019-09-04 ENCOUNTER — Ambulatory Visit (INDEPENDENT_AMBULATORY_CARE_PROVIDER_SITE_OTHER): Payer: BC Managed Care – PPO | Admitting: Neurology

## 2019-09-04 ENCOUNTER — Encounter: Payer: Self-pay | Admitting: Neurology

## 2019-09-04 VITALS — BP 106/69 | HR 69 | Temp 97.9°F

## 2019-09-04 DIAGNOSIS — R202 Paresthesia of skin: Secondary | ICD-10-CM | POA: Diagnosis not present

## 2019-09-04 MED ORDER — CYCLOBENZAPRINE HCL 10 MG PO TABS: 10.0000 mg | ORAL_TABLET | Freq: Every day | ORAL | 3 refills | Status: DC

## 2019-09-04 NOTE — Patient Instructions (Signed)
Cyclobenzaprine tablets What is this medicine? CYCLOBENZAPRINE (sye kloe BEN za preen) is a muscle relaxer. It is used to treat muscle pain, spasms, and stiffness. This medicine may be used for other purposes; ask your health care provider or pharmacist if you have questions. COMMON BRAND NAME(S): Fexmid, Flexeril What should I tell my health care provider before I take this medicine? They need to know if you have any of these conditions:  heart disease, irregular heartbeat, or previous heart attack  liver disease  thyroid problem  an unusual or allergic reaction to cyclobenzaprine, tricyclic antidepressants, lactose, other medicines, foods, dyes, or preservatives  pregnant or trying to get pregnant  breast-feeding How should I use this medicine? Take this medicine by mouth with a glass of water. Follow the directions on the prescription label. If this medicine upsets your stomach, take it with food or milk. Take your medicine at regular intervals. Do not take it more often than directed. Talk to your pediatrician regarding the use of this medicine in children. Special care may be needed. Overdosage: If you think you have taken too much of this medicine contact a poison control center or emergency room at once. NOTE: This medicine is only for you. Do not share this medicine with others. What if I miss a dose? If you miss a dose, take it as soon as you can. If it is almost time for your next dose, take only that dose. Do not take double or extra doses. What may interact with this medicine? Do not take this medicine with any of the following medications:  MAOIs like Carbex, Eldepryl, Marplan, Nardil, and Parnate  narcotic medicines for cough  safinamide This medicine may also interact with the following medications:  alcohol  bupropion  antihistamines for allergy, cough and cold  certain medicines for anxiety or sleep  certain medicines for bladder problems like oxybutynin,  tolterodine  certain medicines for depression like amitriptyline, fluoxetine, sertraline  certain medicines for Parkinson's disease like benztropine, trihexyphenidyl  certain medicines for seizures like phenobarbital, primidone  certain medicines for stomach problems like dicyclomine, hyoscyamine  certain medicines for travel sickness like scopolamine  general anesthetics like halothane, isoflurane, methoxyflurane, propofol  ipratropium  local anesthetics like lidocaine, pramoxine, tetracaine  medicines that relax muscles for surgery  narcotic medicines for pain  phenothiazines like chlorpromazine, mesoridazine, prochlorperazine, thioridazine  verapamil This list may not describe all possible interactions. Give your health care provider a list of all the medicines, herbs, non-prescription drugs, or dietary supplements you use. Also tell them if you smoke, drink alcohol, or use illegal drugs. Some items may interact with your medicine. What should I watch for while using this medicine? Tell your doctor or health care professional if your symptoms do not start to get better or if they get worse. You may get drowsy or dizzy. Do not drive, use machinery, or do anything that needs mental alertness until you know how this medicine affects you. Do not stand or sit up quickly, especially if you are an older patient. This reduces the risk of dizzy or fainting spells. Alcohol may interfere with the effect of this medicine. Avoid alcoholic drinks. If you are taking another medicine that also causes drowsiness, you may have more side effects. Give your health care provider a list of all medicines you use. Your doctor will tell you how much medicine to take. Do not take more medicine than directed. Call emergency for help if you have problems breathing or unusual sleepiness.  Your mouth may get dry. Chewing sugarless gum or sucking hard candy, and drinking plenty of water may help.  Contact your doctor if the problem does not go away or is severe. What side effects may I notice from receiving this medicine? Side effects that you should report to your doctor or health care professional as soon as possible:  allergic reactions like skin rash, itching or hives, swelling of the face, lips, or tongue  breathing problems  chest pain  fast, irregular heartbeat  hallucinations  seizures  unusually weak or tired Side effects that usually do not require medical attention (report to your doctor or health care professional if they continue or are bothersome):  headache  nausea, vomiting This list may not describe all possible side effects. Call your doctor for medical advice about side effects. You may report side effects to FDA at 1-800-FDA-1088. Where should I keep my medicine? Keep out of the reach of children. Store at room temperature between 15 and 30 degrees C (59 and 86 degrees F). Keep container tightly closed. Throw away any unused medicine after the expiration date. NOTE: This sheet is a summary. It may not cover all possible information. If you have questions about this medicine, talk to your doctor, pharmacist, or health care provider.  2020 Elsevier/Gold Standard (2018-03-20 12:49:26)  

## 2019-09-04 NOTE — Progress Notes (Signed)
[facility]    Provider:  Dr Jaynee Eagles Requesting Provider: Emeterio Reeve, DO Primary Care Provider:  Emeterio Reeve, DO  CC:  Tingling  Interval update 09/04/2018: Here for follow up, we reviewed all her lab tests as well as her imaging which were all normal. Her B12 was slightly low but not abnormal, still recommended trying B12 supplementation. She is still having symptoms, she was walking and felt like she had a piece of plastic on the bottom of her foot but nothing was there and it resolved. She had dry needling for the chronic low back muscular pain. She also had some paraspinal thoracic muscular tightness we discussed dry needling for the thoracic spine as well. She has stopped leaning on her elbows and the tingling in her fingers is better. We reviewed MRI of the cervical spine. Discussed PT and dry needling for her thoracic symptoms. She is under stress at work. We discussed the finding in her cervical spine which is likely benign.  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 female here as requested by Emeterio Reeve, DO for paresthesias. She says it started years ago, she noticed her left pinky toe was numb, she forgot about it, sporadic. Then 18 months ago her hands started tingling in certain positions, arms stretched or up or our, her hands would tingle, she doesn't know which fingers, sometimes one hand more than the other. She started noticing numbness on the bottom of her feet, happened twice, like she had something on her feet but nothing was there. 4 months ago worsened, a spot on her lower back, start tingling (points to the to the thoracic area on the left side). 5  weeks ago she started having tingling in the left hand in the pinky and ring finger. Episodes are brief, one minute, other than the hands which lasted hours and comes and goes. No joint pain except on the left knee she had a few episodes of pain with walking, no other significant joint pain. She has had some white apthous ulcers in the mouth a few times 2x a year. No weakness, urinary changes, changes to vision, SOB. She is tense and anxious. She has had muscle spasms and tightness in the neck muscles.    (Reviewed XR mandible report: FINDINGS: Bone mineralization is within normal limits. Bilateral TMJ alignment appears within normal limits. The mandible appears intact. No acute osseous abnormality identified. Symmetric and normal appearing paranasal sinus and mastoid pneumatization. IMPRESSION: Negative radiographic mandible series.)  Review of Systems: Patient complains of symptoms per HPI as well as the following symptoms: paresthesias, stress . Pertinent negatives and positives per HPI. All others negative   Social History   Socioeconomic History  . Marital status: Married    Spouse name: Not on file  . Number of children: 0  . Years of education: Not on file  . Highest education level: Bachelor's degree (e.g., BA, AB, BS)  Occupational History  . Not on file  Tobacco Use  . Smoking status: Never Smoker  . Smokeless tobacco: Never Used  Substance and Sexual Activity  . Alcohol use: Yes    Alcohol/week: 2.0 standard drinks    Types: 2 Standard drinks or equivalent per  week  . Drug use: Never  . Sexual activity: Not on file  Other Topics Concern  . Not on file  Social History Narrative   Lives at home with husband   Right handed   Caffeine: 1 cup/day   Social Determinants of Health   Financial Resource Strain:   . Difficulty of Paying Living Expenses:   Food Insecurity:   . Worried About Charity fundraiser in the Last Year:   . Arboriculturist in the Last Year:     Transportation Needs:   . Film/video editor (Medical):   Marland Kitchen Lack of Transportation (Non-Medical):   Physical Activity:   . Days of Exercise per Week:   . Minutes of Exercise per Session:   Stress:   . Feeling of Stress :   Social Connections:   . Frequency of Communication with Friends and Family:   . Frequency of Social Gatherings with Friends and Family:   . Attends Religious Services:   . Active Member of Clubs or Organizations:   . Attends Archivist Meetings:   Marland Kitchen Marital Status:   Intimate Partner Violence:   . Fear of Current or Ex-Partner:   . Emotionally Abused:   Marland Kitchen Physically Abused:   . Sexually Abused:     Family History  Problem Relation Age of Onset  . Hypertension Mother   . Other Mother        acoustic neuroma  . Cancer Sister   . Cancer Paternal Uncle   . Diabetes Paternal Uncle   . Depression Maternal Grandmother   . Diabetes Paternal Grandfather   . Neuropathy Neg Hx     Past Medical History:  Diagnosis Date  . Chronic back pain   . Knee pain     Patient Active Problem List   Diagnosis Date Noted  . Paresthesias 04/15/2019  . Myofascial pain 04/15/2019  . Anxiety state 02/12/2018  . Arthralgia of left temporomandibular joint 12/24/2017  . Chronic bilateral back pain 09/12/2016    Past Surgical History:  Procedure Laterality Date  . WISDOM TOOTH EXTRACTION      Current Outpatient Medications  Medication Sig Dispense Refill  . Multiple Vitamins-Minerals (ONE-A-DAY WOMENS PO) Take 1 tablet by mouth. Takes about 4 days a week    . cyclobenzaprine (FLEXERIL) 10 MG tablet Take 1 tablet (10 mg total) by mouth at bedtime. 30 tablet 3   No current facility-administered medications for this visit.    Allergies as of 09/04/2019 - Review Complete 09/04/2019  Allergen Reaction Noted  . Amoxicillin Hives 07/23/2015    Vitals: BP 106/69 (BP Location: Right Arm, Patient Position: Sitting)   Pulse 69   Temp 97.9 F (36.6 C)  Comment: taken at front Last Weight:  Wt Readings from Last 1 Encounters:  04/15/19 152 lb (68.9 kg)   Last Height:   Ht Readings from Last 1 Encounters:  04/15/19 '5\' 7"'$  (1.702 m)   Physical exam: Exam: Gen: NAD, conversant, well nourised, well groomed                     CV: RRR, no MRG. No Carotid Bruits. No peripheral edema, warm, nontender Eyes: Conjunctivae clear without exudates or hemorrhage  Neuro: Detailed Neurologic Exam  Speech:    Speech is normal; fluent and spontaneous with normal comprehension.  Cognition:    The patient is oriented to person, place, and time;     recent and remote memory intact;  language fluent;     normal attention, concentration,     fund of knowledge Cranial Nerves:    The pupils are equal, round, and reactive to light. The fundi are normal and spontaneous venous pulsations are present. Visual fields are full to finger confrontation. Extraocular movements are intact. Trigeminal sensation is intact and the muscles of mastication are normal. The face is symmetric. The palate elevates in the midline. Hearing intact. Voice is normal. Shoulder shrug is normal. The tongue has normal motion without fasciculations.   Coordination:    Normal finger to nose and heel to shin. Normal rapid alternating movements.   Gait:    Heel-toe and tandem gait are normal.   Motor Observation:    No asymmetry, no atrophy, and no involuntary movements noted. Tone:    Normal muscle tone.    Posture:    Posture is normal. normal erect    Strength:    Strength is V/V in the upper and lower limbs.      Sensation: intact to LT     Reflex Exam:  DTR's:    Deep tendon reflexes in the upper and lower extremities are normal bilaterally.   Toes:    The toes are downgoing bilaterally.   Clonus:    Clonus is absent.    Assessment/Plan: This is a very nice 34 year old female with random paresthesias.  They have been going on for 2 years in multiple parts of  her body.  I think that may be explained by different things including upper extremity ulnar neuropathy or nerve entrapment, the pain and tingling in her thoracic spine has been ongoing for years and she has chronic back pain and per patient report some scoliosis.  The symptoms in her legs are quite vague, for example twice she has had feelings of something on the bottom of her foot briefly.    EMG/NCS: bilateral uppers (CTS vs Ulnar or both) and left leg. NORMAL - Her B12 was slightly low but not abnormal, still recommended trying B12 supplementation.  -  She has stopped leaning on her elbows and the tingling in her fingers is better.  -  Discussed PT and dry needling for her thoracic symptoms. She is under stress at work.  - MRI of the brain normal. MRI cervical spine with small focal dilatation at c6 vs very small focal syringomyelia: finding in her cervical spine which is benign and not causing her symptoms Labs: ife, rpr, sjogrens, RF, cbc,cmp,b6,heavy metals,ana,esr,tsh,mma,b12,folate all unremarkable   Cc: Emeterio Reeve, DO,    Sarina Ill, MD  [facility] [address] [place], [place] [address]  Phone [phone] Fax [phone]  I spent 40 minutes of face-to-face and non-face-to-face time with patient on the  1. Paresthesias    diagnosis.  This included previsit chart review, lab review, study review, order entry, electronic health record documentation, patient education on the different diagnostic and therapeutic options, counseling and coordination of care, risks and benefits of management, compliance, or risk factor reduction

## 2019-09-08 ENCOUNTER — Encounter: Payer: Self-pay | Admitting: Neurology

## 2019-12-16 IMAGING — DX DG MANDIBLE 4+V
4 series · 4 of 4 positions shown · non-contrast
Comparison: None.

CLINICAL DATA: 32-year-old female with left TMJ pain for 2 days. No
known injury.

EXAM:
MANDIBLE - 4+ VIEW

[mandible pa (1 of 3)]
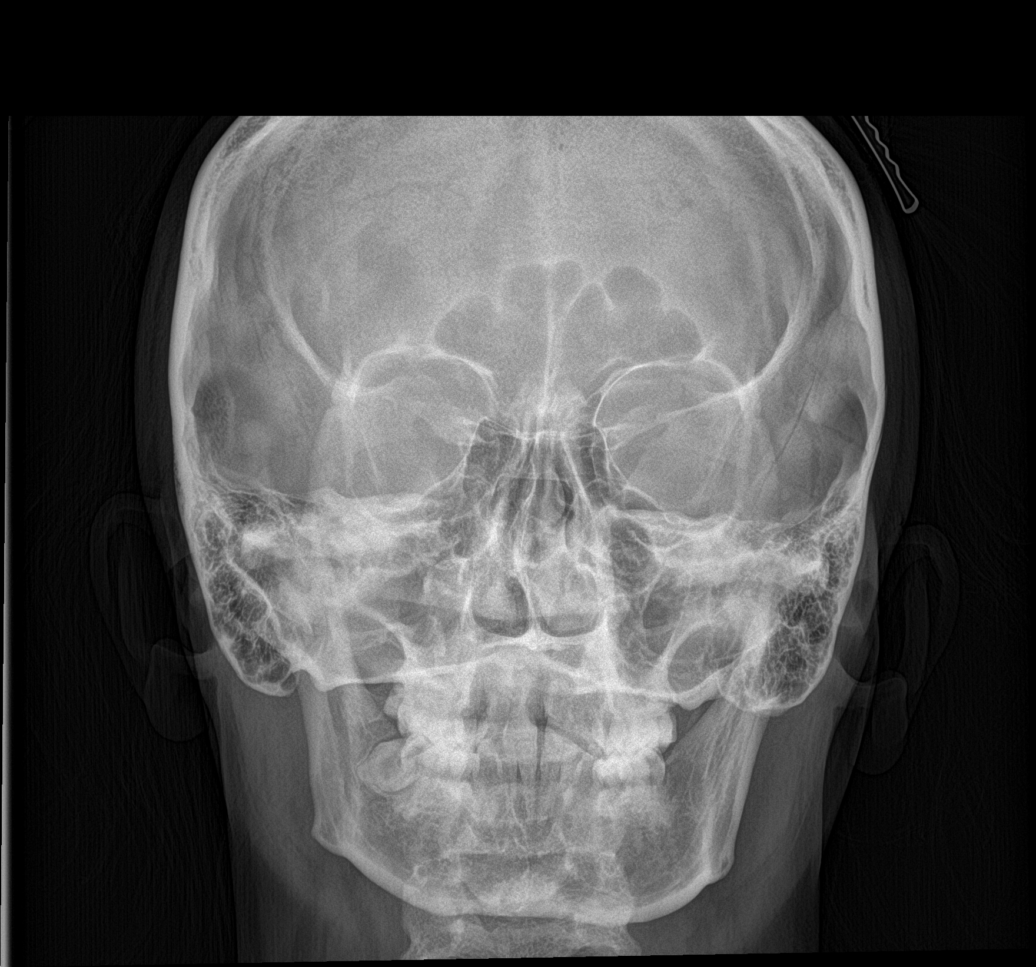

[mandible pa (2 of 3)]
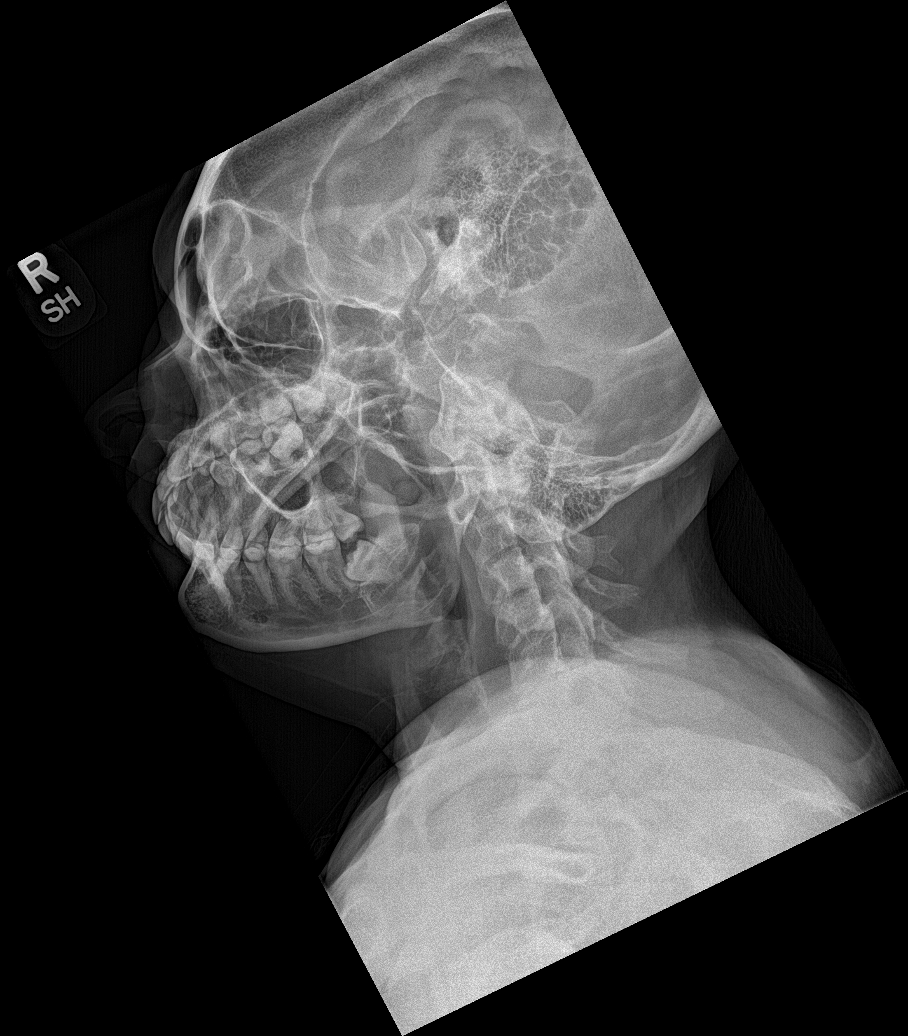

[mandible pa (3 of 3)]
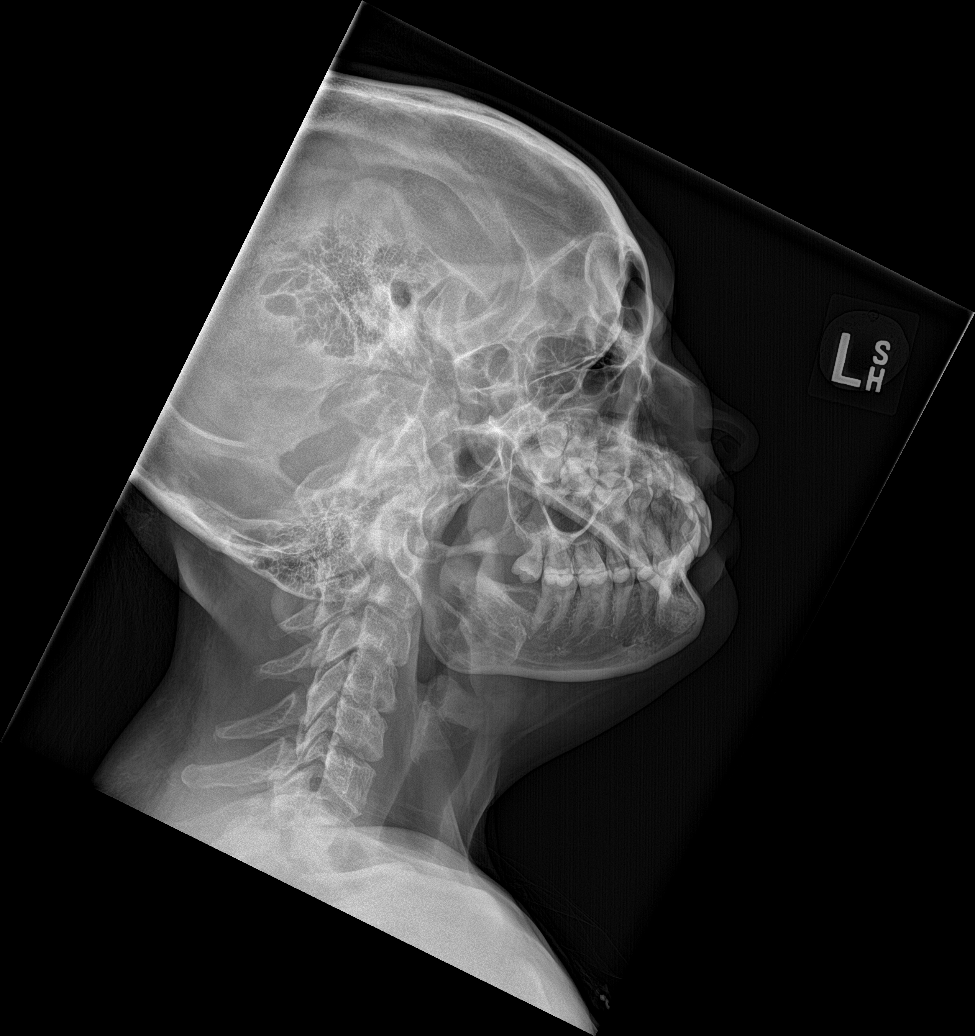

[mandible townes]
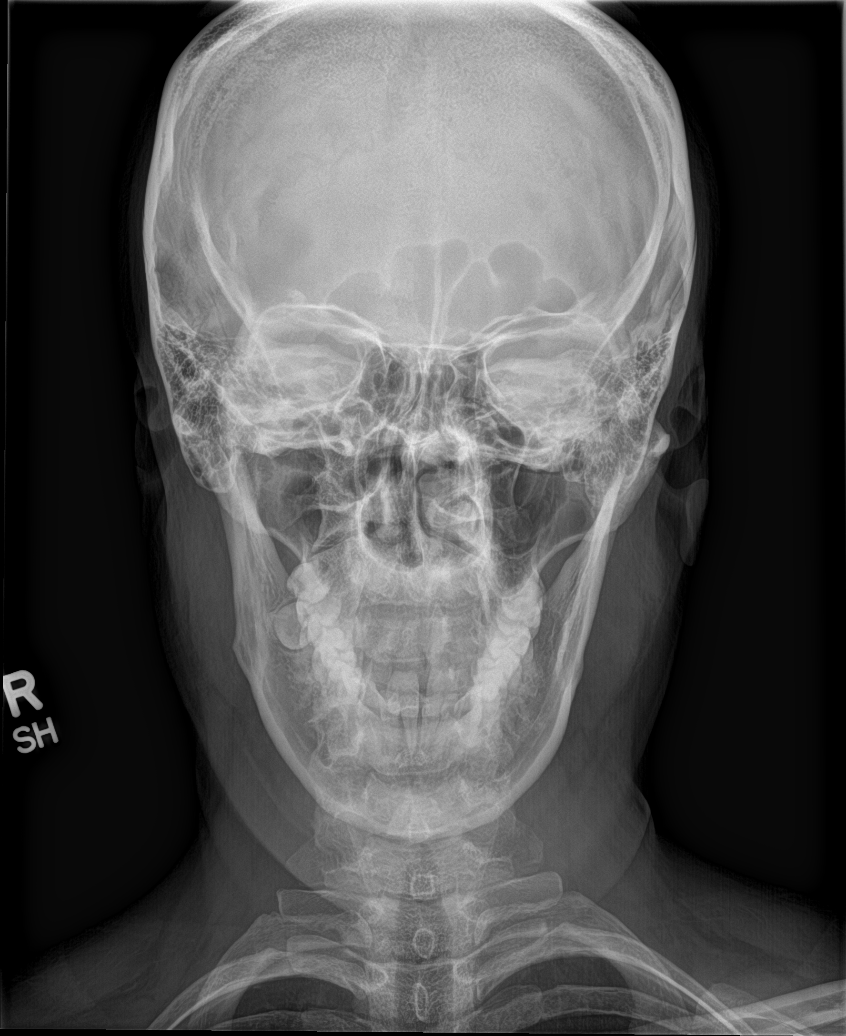

[4 of 4 positions shown; findings below may reference images not displayed]

FINDINGS: Bone mineralization is within normal limits. Bilateral TMJ alignment
appears within normal limits. The mandible appears intact. No acute
osseous abnormality identified. Symmetric and normal appearing
paranasal sinus and mastoid pneumatization.
IMPRESSION: Negative radiographic mandible series.

## 2020-06-08 ENCOUNTER — Encounter: Payer: Self-pay | Admitting: Osteopathic Medicine

## 2020-06-08 ENCOUNTER — Other Ambulatory Visit: Payer: Self-pay

## 2020-06-08 ENCOUNTER — Ambulatory Visit: Payer: Managed Care, Other (non HMO) | Admitting: Osteopathic Medicine

## 2020-06-08 VITALS — BP 107/58 | HR 94 | Temp 97.9°F | Wt 157.1 lb

## 2020-06-08 DIAGNOSIS — Z3169 Encounter for other general counseling and advice on procreation: Secondary | ICD-10-CM | POA: Diagnosis not present

## 2020-06-08 NOTE — Patient Instructions (Signed)
Preparing for Pregnancy If you are planning to become pregnant, talk to your health care provider about preconception care. This type of care helps you prepare for a safe and healthy pregnancy. During this visit, your health care provider will:  Do a complete physical exam, including a Pap test.  Take your complete medical history.  Give you information, answer your questions, and help you resolve problems. Preconception checklist Medical history  Tell your health care provider about any medical conditions you have or have had. Your pregnancy or your ability to become pregnant may be affected by long-term (chronic) conditions, such as: ? Diabetes. ? High blood pressure (hypertension). ? Thyroid problems.  Tell your health care provider about your family's medical history and your partner's medical history.   Tell your health care provider if you have or have had any sexually transmitted infections, or STIs. These can affect your pregnancy. In some cases, they can be passed to your baby.  If needed, discuss the benefits of genetic testing. This test checks for conditions that may be passed from parent to child.  Tell your health care provider about: ? Any problems you had getting pregnant or while pregnant. ? Any medicines you take. These include vitamins, herbal supplements, and over-the-counter medicines. ? Your history of getting vaccines. Discuss any vaccines that you may need. Diet  Ask your health care provider about what foods to eat in order to get a balance of nutrients. This is especially important when you are pregnant or preparing to become pregnant. It is recommended that women of childbearing age take a folic acid supplement of 400 mcg daily and eat foods rich in folic acid to prevent certain birth defects.  Ask your health care provider to help you reach a healthy weight before pregnancy. ? If you are overweight, you may have a higher risk for certain problems. These  include hypertension, diabetes, and early (preterm) birth. ? If you are underweight, you are more likely to have a baby who has a low birth weight. Lifestyle, work, and home Let your health care provider know about:  Any lifestyle habits that you have, such as use of alcohol, drugs, or tobacco products.  Fun and leisure activities that may put you at risk during pregnancy, such as downhill skiing and certain exercise programs.  Any plans to travel out of the country, especially to places with an active [place] virus outbreak.  Harmful substances that you may be exposed to at work or at home. These include chemicals, pesticides, radiation, and substances from cat litter boxes.  Any concerns you have for your safety at home. Mental health Tell your health care provider about:  Any history of mental health conditions, including feelings of depression, sadness, or anxiety.  Any medicines that you take for a mental health condition. These include herbs and supplements. How do I know that I am pregnant? You may be pregnant if you have been sexually active and you miss your period. Other symptoms of early pregnancy include:  Mild cramping.  Very light vaginal bleeding (spotting).  Feeling more tired than usual.  Nausea and vomiting. These may be signs of morning sickness. Take a home pregnancy test if you have any of these symptoms. This test checks for a hormone in your urine called human chorionic gonadotropin, or hCG. A woman's body begins to make this hormone during early pregnancy. These tests are very accurate. Wait until at least the first day after you miss your period to take a  home pregnancy test. If the test shows that you are pregnant, call your health care provider for a prenatal care visit. What should I do if I become pregnant?  Schedule a visit with your health care provider as soon as you suspect you are pregnant.  Talk to your health care provider if you are taking  prescription medicines to determine if they are safe to take during pregnancy.  You may continue to have sex if it does not cause pain or other problems, such as vaginal bleeding. Follow these instructions at home: Eating and drinking  Follow instructions from your health care provider about eating or drinking restrictions.  Drink enough fluid to keep your urine pale yellow.  Eat a balanced diet. This includes fresh fruits and vegetables, whole grains, lean meats, low-fat dairy products, healthy fats, and foods that are high in fiber. Ask to meet with a nutritionist or registered dietitian for help with meal planning and goals.  Avoid eating raw or undercooked meat and seafood.  Avoid eating or drinking unpasteurized dairy products.   Lifestyle  Get regular exercise. Try to be active for at least 30 minutes a day on most days of the week. Ask your health care provider which activities are safe during pregnancy.  Maintain a healthy weight.  Avoid toxic fumes and chemicals.  Avoid cleaning cat litter boxes. Cat feces may contain a harmful parasite called toxoplasma.  Avoid travel to countries where [place] virus is common.  Do not use any products that contain nicotine or tobacco, such as cigarettes, e-cigarettes, and chewing tobacco. If you need help quitting, ask your health care provider.  Do not drink alcohol or use drugs.      General instructions  Keep an accurate record of your menstrual periods. This makes it easier for your health care provider to determine your baby's due date.  Take over-the-counter and prescription medicines only as told by your health care provider.  Begin taking prenatal vitamins and folic acid supplements daily as directed.  Manage any chronic conditions, such as hypertension and diabetes, as told by your health care provider. This is important. Summary  If you are planning to become pregnant, talk to your health care provider about preconception  care. This is an important part of planning for a healthy pregnancy.  Women of childbearing age should take 545 mcg of folic acid daily in addition to eating a diet rich in folic acid. This will prevent certain birth defects.  Schedule a visit with your health care provider as soon as you suspect you are pregnant. Tell your health care provider about your medical history, lifestyle activities, home safety, and other things that may concern you. This information is not intended to replace advice given to you by your health care provider. Make sure you discuss any questions you have with your health care provider. Document Revised: 01/15/2019 Document Reviewed: 01/15/2019 Elsevier Patient Education  [place].

## 2020-06-08 NOTE — Progress Notes (Signed)
HPI: Brittney Mays is a 35 y.o. female who  has a past medical history of Chronic back pain and Knee pain.  she presents to [facility] today, 06/08/20,  for chief complaint of:  Preconception counseling   Pt is a G0P0 previously taking OCP for birth control, no longer taking this medication in anticipation of getting pregnant sometime in the next year. Currently using condoms as birth control and has never attempted to become pregnant in the past.   Pt plans to have midwife manage pregnancy care and wishes to give birth at [facility].   First day of last period is today 06/08/2020 and pt reports regular monthly periods with no changes or concerns.   Currently taking a multivitamin with 400 mcg of folate, occasional ibuprofen for back pain, and daily antihistamine for allergies.    Family history of Nevoid basal cell carcinoma syndrome (aka Gorlin Syndrome) in her sister and [name] syndrome in her 1st cousin on her father's side. No history of genetic disease in her partner's family.   Up to date on tdap, hepb, influenza, and COVID vaccines. Pt states last pap smear was normal about 5 years ago.   No personal history of HTN, diabetes, or thyroid disease. Never used tobacco products. Drinks 1-2 glasses of wine a week, no history of IV drug or other illicit drug use.   Past medical, surgical, social and family history reviewed:  Patient Active Problem List   Diagnosis Date Noted  . Paresthesias 04/15/2019  . Myofascial pain 04/15/2019  . Anxiety state 02/12/2018  . Arthralgia of left temporomandibular joint 12/24/2017  . Chronic bilateral back pain 09/12/2016    Past Surgical History:  Procedure Laterality Date  . WISDOM TOOTH EXTRACTION      Social History   Tobacco Use  . Smoking status: Never Smoker  . Smokeless tobacco: Never Used  Substance Use Topics  . Alcohol use: Yes    Alcohol/week: 2.0 standard drinks    Types:  2 Standard drinks or equivalent per week    Family History  Problem Relation Age of Onset  . Hypertension Mother   . Other Mother        acoustic neuroma  . Cancer Sister   . Cancer Paternal Uncle   . Diabetes Paternal Uncle   . Depression Maternal Grandmother   . Diabetes Paternal Grandfather   . Neuropathy Neg Hx      Current medication list and allergy/intolerance information reviewed:    Current Outpatient Medications  Medication Sig Dispense Refill  . FEXOFENADINE HCL PO     . ibuprofen (ADVIL) 200 MG tablet Take 200 mg by mouth every 6 (six) hours as needed.    . Multiple Vitamins-Minerals (ONE-A-DAY WOMENS PO) Take 1 tablet by mouth. Takes about 4 days a week    . cyclobenzaprine (FLEXERIL) 10 MG tablet Take 1 tablet (10 mg total) by mouth at bedtime. (Patient not taking: Reported on 06/08/2020) 30 tablet 3   No current facility-administered medications for this visit.    Allergies  Allergen Reactions  . Amoxicillin Hives      Review of Systems:  Cardiac: No  chest pain, No  pressure, No palpitations  Respiratory:  No  shortness of breath.   Gastrointestinal: No  abdominal pain, No  nausea,  No  diarrhea, No  constipation   Genitourinary:  No  abnormal genital bleeding, No abnormal genital discharge  Psychiatric: No  concerns with depression, No  concerns with anxiety, No sleep problems, No mood problems  Exam:  BP (!) 107/58 (BP Location: Left Arm, Patient Position: Sitting, Cuff Size: Normal)   Pulse 94   Temp 97.9 F (36.6 C) (Oral)   Wt 157 lb 1.3 oz (71.3 kg)   BMI 24.60 kg/m   Constitutional: VS see above. General Appearance: alert, well-developed, well-nourished, NAD  Eyes: Normal lids and conjunctive, non-icteric sclera.   Neck: No masses, trachea midline. No thyroid enlargement. No tenderness/mass appreciated. No lymphadenopathy  Respiratory: Normal respiratory effort. no wheeze, no rhonchi, no rales  Cardiovascular: S1/S2 normal, no  murmur, no rub/gallop auscultated. RRR. No lower extremity edema.   Gastrointestinal: Nontender, no masses. No hepatomegaly, no splenomegaly. No hernia appreciated.Rectal exam deferred.   Musculoskeletal: Gait normal.    Neurological: Normal balance/coordination. No tremor.   Skin: warm, dry, intact.   Psychiatric: Normal judgment/insight. Normal mood and affect.    No results found for this or any previous visit (from the past 72 hour(s)).  No results found.   ASSESSMENT/PLAN: The encounter diagnosis was Encounter for preconception consultation.   Preconception counseling   Referral medical geneticist     Referral to [facility], would defer to them whether MFM needs to be involved / based on genetics recommendations  Discussed importance of daily folate at least 400 mcg and pt will continue daily supplementation   Dicussed common teratogens such as alcohol and pt plans to avoid teratogens  Advise always d/w medical provider or pharmacist re: meds in pregnancy including OTC   Health maintenance   CBC  CMP  Lipid panel    Orders Placed This Encounter  Procedures  . CBC  . COMPLETE METABOLIC PANEL WITH GFR  . Lipid panel  . Ambulatory referral to Genetics    No orders of the defined types were placed in this encounter.   Patient Instructions   Preparing for Pregnancy If you are planning to become pregnant, talk to your health care provider about preconception care. This type of care helps you prepare for a safe and healthy pregnancy. During this visit, your health care provider will:  Do a complete physical exam, including a Pap test.  Take your complete medical history.  Give you information, answer your questions, and help you resolve problems. Preconception checklist Medical history  Tell your health care provider about any medical conditions you have or have had. Your pregnancy or your ability to become pregnant may be affected by  long-term (chronic) conditions, such as: ? Diabetes. ? High blood pressure (hypertension). ? Thyroid problems.  Tell your health care provider about your family's medical history and your partner's medical history.   Tell your health care provider if you have or have had any sexually transmitted infections, or STIs. These can affect your pregnancy. In some cases, they can be passed to your baby.  If needed, discuss the benefits of genetic testing. This test checks for conditions that may be passed from parent to child.  Tell your health care provider about: ? Any problems you had getting pregnant or while pregnant. ? Any medicines you take. These include vitamins, herbal supplements, and over-the-counter medicines. ? Your history of getting vaccines. Discuss any vaccines that you may need. Diet  Ask your health care provider about what foods to eat in order to get a balance of nutrients. This is especially important when you are pregnant or preparing to become pregnant. It is recommended that women of childbearing age take a folic acid  supplement of 400 mcg daily and eat foods rich in folic acid to prevent certain birth defects.  Ask your health care provider to help you reach a healthy weight before pregnancy. ? If you are overweight, you may have a higher risk for certain problems. These include hypertension, diabetes, and early (preterm) birth. ? If you are underweight, you are more likely to have a baby who has a low birth weight. Lifestyle, work, and home Let your health care provider know about:  Any lifestyle habits that you have, such as use of alcohol, drugs, or tobacco products.  Fun and leisure activities that may put you at risk during pregnancy, such as downhill skiing and certain exercise programs.  Any plans to travel out of the country, especially to places with an active [place] virus outbreak.  Harmful substances that you may be exposed to at work or at home. These  include chemicals, pesticides, radiation, and substances from cat litter boxes.  Any concerns you have for your safety at home. Mental health Tell your health care provider about:  Any history of mental health conditions, including feelings of depression, sadness, or anxiety.  Any medicines that you take for a mental health condition. These include herbs and supplements. How do I know that I am pregnant? You may be pregnant if you have been sexually active and you miss your period. Other symptoms of early pregnancy include:  Mild cramping.  Very light vaginal bleeding (spotting).  Feeling more tired than usual.  Nausea and vomiting. These may be signs of morning sickness. Take a home pregnancy test if you have any of these symptoms. This test checks for a hormone in your urine called human chorionic gonadotropin, or hCG. A woman's body begins to make this hormone during early pregnancy. These tests are very accurate. Wait until at least the first day after you miss your period to take a home pregnancy test. If the test shows that you are pregnant, call your health care provider for a prenatal care visit. What should I do if I become pregnant?  Schedule a visit with your health care provider as soon as you suspect you are pregnant.  Talk to your health care provider if you are taking prescription medicines to determine if they are safe to take during pregnancy.  You may continue to have sex if it does not cause pain or other problems, such as vaginal bleeding. Follow these instructions at home: Eating and drinking  Follow instructions from your health care provider about eating or drinking restrictions.  Drink enough fluid to keep your urine pale yellow.  Eat a balanced diet. This includes fresh fruits and vegetables, whole grains, lean meats, low-fat dairy products, healthy fats, and foods that are high in fiber. Ask to meet with a nutritionist or registered dietitian for help with  meal planning and goals.  Avoid eating raw or undercooked meat and seafood.  Avoid eating or drinking unpasteurized dairy products.   Lifestyle  Get regular exercise. Try to be active for at least 30 minutes a day on most days of the week. Ask your health care provider which activities are safe during pregnancy.  Maintain a healthy weight.  Avoid toxic fumes and chemicals.  Avoid cleaning cat litter boxes. Cat feces may contain a harmful parasite called toxoplasma.  Avoid travel to countries where [place] virus is common.  Do not use any products that contain nicotine or tobacco, such as cigarettes, e-cigarettes, and chewing tobacco. If you need help  quitting, ask your health care provider.  Do not drink alcohol or use drugs.      General instructions  Keep an accurate record of your menstrual periods. This makes it easier for your health care provider to determine your baby's due date.  Take over-the-counter and prescription medicines only as told by your health care provider.  Begin taking prenatal vitamins and folic acid supplements daily as directed.  Manage any chronic conditions, such as hypertension and diabetes, as told by your health care provider. This is important. Summary  If you are planning to become pregnant, talk to your health care provider about preconception care. This is an important part of planning for a healthy pregnancy.  Women of childbearing age should take 532 mcg of folic acid daily in addition to eating a diet rich in folic acid. This will prevent certain birth defects.  Schedule a visit with your health care provider as soon as you suspect you are pregnant. Tell your health care provider about your medical history, lifestyle activities, home safety, and other things that may concern you. This information is not intended to replace advice given to you by your health care provider. Make sure you discuss any questions you have with your health care  provider. Document Revised: 01/15/2019 Document Reviewed: 01/15/2019 Elsevier Patient Education  [place].        Visit summary with medication list and pertinent instructions was printed for patient to review. All questions at time of visit were answered - patient instructed to contact office with any additional concerns or updates. ER/RTC precautions were reviewed with the patient.     Please note: voice recognition software was used to produce this document, and typos may escape review. Please contact Dr. Sheppard Coil for any needed clarifications.     Follow-up plan: Return for recheck as needed .

## 2020-06-09 LAB — CBC
HCT: 40.9 % (ref 35.0–45.0)
Hemoglobin: 13.9 g/dL (ref 11.7–15.5)
MCH: 32.9 pg (ref 27.0–33.0)
MCHC: 34 g/dL (ref 32.0–36.0)
MCV: 96.9 fL (ref 80.0–100.0)
MPV: 10.7 fL (ref 7.5–12.5)
Platelets: 204 10*3/uL (ref 140–400)
RBC: 4.22 10*6/uL (ref 3.80–5.10)
RDW: 11.3 % (ref 11.0–15.0)
WBC: 5 10*3/uL (ref 3.8–10.8)

## 2020-06-09 LAB — COMPLETE METABOLIC PANEL WITH GFR
AG Ratio: 1.8 (calc) (ref 1.0–2.5)
ALT: 17 U/L (ref 6–29)
AST: 20 U/L (ref 10–30)
Albumin: 4.3 g/dL (ref 3.6–5.1)
Alkaline phosphatase (APISO): 36 U/L (ref 31–125)
BUN: 13 mg/dL (ref 7–25)
CO2: 32 mmol/L (ref 20–32)
Calcium: 9.5 mg/dL (ref 8.6–10.2)
Chloride: 106 mmol/L (ref 98–110)
Creat: 0.89 mg/dL (ref 0.50–1.10)
GFR, Est African American: 99 mL/min/{1.73_m2} (ref >=60)
GFR, Est Non African American: 85 mL/min/{1.73_m2} (ref >=60)
Globulin: 2.4 g/dL (calc) (ref 1.9–3.7)
Glucose, Bld: 68 mg/dL (ref 65–139)
Potassium: 4.7 mmol/L (ref 3.5–5.3)
Sodium: 142 mmol/L (ref 135–146)
Total Bilirubin: 0.7 mg/dL (ref 0.2–1.2)
Total Protein: 6.7 g/dL (ref 6.1–8.1)

## 2020-06-09 LAB — LIPID PANEL
Cholesterol: 158 mg/dL (ref <200)
HDL: 52 mg/dL (ref >=50)
LDL Cholesterol (Calc): 89 mg/dL (calc)
Non-HDL Cholesterol (Calc): 106 mg/dL (calc) (ref <130)
Total CHOL/HDL Ratio: 3 (calc) (ref <5.0)
Triglycerides: 77 mg/dL (ref <150)

## 2020-06-10 ENCOUNTER — Encounter: Payer: Self-pay | Admitting: Osteopathic Medicine

## 2020-06-16 ENCOUNTER — Telehealth: Payer: Self-pay | Admitting: Genetic Counselor

## 2020-06-16 NOTE — Telephone Encounter (Signed)
Received a genetic counseling referral from Dr. Sheppard Coil for fhx of basal cell carcinoma. Brittney Mays has been cld and scheduled to see Brittney Mays via [place] video on 2/23 at 9am. I verified. The pt has an active my chart acct.

## 2020-06-20 ENCOUNTER — Encounter (INDEPENDENT_AMBULATORY_CARE_PROVIDER_SITE_OTHER): Payer: Self-pay

## 2020-06-22 ENCOUNTER — Encounter: Payer: Self-pay | Admitting: Genetic Counselor

## 2020-06-23 ENCOUNTER — Encounter: Payer: Self-pay | Admitting: Genetic Counselor

## 2020-06-23 ENCOUNTER — Inpatient Hospital Stay: Payer: Managed Care, Other (non HMO) | Attending: Genetic Counselor | Admitting: Genetic Counselor

## 2020-06-23 ENCOUNTER — Other Ambulatory Visit: Payer: Self-pay | Admitting: Genetic Counselor

## 2020-06-23 DIAGNOSIS — Z808 Family history of malignant neoplasm of other organs or systems: Secondary | ICD-10-CM

## 2020-06-23 NOTE — Progress Notes (Signed)
REFERRING PROVIDER: Emeterio Reeve, [place],   [id number]  PRIMARY PROVIDER:  Emeterio Reeve, DO  PRIMARY REASON FOR VISIT:  1. Family history of nonmelanoma skin cancer      HISTORY OF PRESENT ILLNESS:   Ms. Lewing, a 35 y.o. female, was seen for a [place] cancer genetics consultation at the request of Dr. Sheppard Coil due to a family history of Gorlin syndrome.  Ms. Ragon presents to clinic today to discuss the possibility of a hereditary predisposition to cancer, genetic testing, and to further clarify her future cancer risks, as well as potential cancer risks for family members.   Ms. Rasch is a 35 y.o. female with no personal history of cancer.  Her sister was diagnosed with Gorlin syndrome at age 84 by her dentist who realized that she was not getting her permanent teeth.   CANCER HISTORY:  Oncology History   No history exists.     RISK FACTORS:  Menarche was at age 50.  First live birth at age N/A.  OCP use for approximately 1-2 years.  Ovaries intact: yes.  Hysterectomy: no.  Menopausal status: premenopausal.  HRT use: 0 years. Colonoscopy: no; not examined. Mammogram within the last year: no. Number of breast biopsies: 0. Up to date with pelvic exams: yes. Any excessive radiation exposure in the past: no  Past Medical History:  Diagnosis Date  . Chronic back pain   . Family history of nonmelanoma skin cancer   . Knee pain     Past Surgical History:  Procedure Laterality Date  . WISDOM TOOTH EXTRACTION      Social History   Socioeconomic History  . Marital status: Married    Spouse name: Not on file  . Number of children: 0  . Years of education: Not on file  . Highest education level: Bachelor's degree (e.g., BA, AB, BS)  Occupational History  . Not on file  Tobacco Use  . Smoking status: Never Smoker  . Smokeless tobacco: Never Used  Vaping Use  . Vaping Use: Never used  Substance and Sexual  Activity  . Alcohol use: Yes    Alcohol/week: 2.0 standard drinks    Types: 2 Standard drinks or equivalent per week  . Drug use: Never  . Sexual activity: Not on file  Other Topics Concern  . Not on file  Social History Narrative   Lives at home with husband   Right handed   Caffeine: 1 cup/day   Social Determinants of Health   Financial Resource Strain: Not on file  Food Insecurity: Not on file  Transportation Needs: Not on file  Physical Activity: Not on file  Stress: Not on file  Social Connections: Not on file     FAMILY HISTORY:  We obtained a detailed, 4-generation family history.  Significant diagnoses are listed below: Family History  Problem Relation Age of Onset  . Hypertension Mother   . Other Mother        acoustic neuroma  . Cancer Sister        Gorlin syndrome  . Leukemia Paternal Uncle 82  . Depression Maternal Grandmother   . Leukemia Maternal Grandmother 73  . Other Maternal Grandmother        'forked ribs', ? gorlin syndrome  . Diabetes Paternal Grandfather   . Other [place]  . Down syndrome Cousin   . Neuropathy Neg Hx     The patient  does not have children.  She has a brother and sister.  Her sister has had multiple BCC and has a diagnosis of Gorlin syndrome.  Her father is deceased and her mother is living.  The patient's mother was diagnosed with an acoustic neuroma at age 46.  She has one sister who is cancer free.  The maternal grandparents are deceased.  The grandmother had leukemia, and was thought to possibly have Gorlin syndrome due to having 'forked ribs'.  The patient's father died in an accident at age 7.  He had two brothers and two sisters. One brother died of leukemia at 28.  The grandparents are deceased.  Ms. Sterba is aware of previous family history of genetic testing for hereditary cancer risks. Patient's maternal ancestors are of [place] descent, and paternal ancestors are of [place] and [place]  descent. There is no reported Ashkenazi Jewish ancestry. There is no known consanguinity.   GENETIC COUNSELING ASSESSMENT: Ms. Tahir is a 35 y.o. female with a family history of Gorlin syndrome which is somewhat suggestive of a hereditary cancer syndrome and predisposition to cancer given her 50% risk. We, therefore, discussed and recommended the following at today's visit.   DISCUSSION: We discussed that Nevoid basal cell carcinoma syndrome, also called Gorlin syndrome, is an inherited, multisystemic disorder that is associated with an increased risk of certain cancerous and non-cancerous tumors.  The clinical features among affected individuals are highly variable.  The most common type of cancer is basal cell carcinoma, typically developing during adolescence or early adulthood.  Up to 90% of affected individuals develop benign tumors of the jaw, and about 5% will develop a brain tumor called medulloblastoma.  Based on her sister's diagnosis of Gorlin syndrome, Ms. Verville has a 50% chance of also having this same condition.    Ms. Dottavio discussed that her maternal grandmother was thought to possibly have Gorlin due to a rib abnormality.  We discussed that if her grandmother had this, and her sister has this, then her mother would also have this, and she should consider undergoing genetic testing in order to learn if sh is affected.  We discussed the family history of Down syndrome in her cousin's child.  Based on this family history, we would not expect that Ms. Pfannenstiel has a higher chance of having a child with Down syndrome.  Most likely she will be offered prenatal screening for this condition as well.  Lastly, there is a paternal cousin with [name]. This is generally caused by a ring 22 chromosome.  About 20% of cases are inherited from a parent, while the majority are sporadic.  The Her cousin has learning delays and while she was verbal when she was younger, she has become  non-verbal.  Neither parent is affected, and therefore this is most likely a rare, sporadic condition and we would not expect that Ms. Hawthorn would have a higher chance of having a child with [name].  We reviewed the characteristics, features and inheritance patterns of hereditary cancer syndromes. We also discussed genetic testing, including the appropriate family members to test, the process of testing, insurance coverage and turn-around-time for results. We discussed the implications of a negative, positive, carrier and/or variant of uncertain significant result. We recommended Ms. Cerney pursue genetic testing for the PTCH1 gene.  We will also reflex to the multi-cancer gene panel. The Multi-Gene Panel offered by [facility] includes sequencing and/or deletion duplication testing of the following 84 genes: AIP, ALK, APC, ATM, AXIN2,BAP1,  BARD1, BLM, BMPR1A, BRCA1, BRCA2, BRIP1, CASR, CDC73, CDH1, CDK4, CDKN1B, CDKN1C, CDKN2A (p14ARF), CDKN2A (p16INK4a), CEBPA, CHEK2, CTNNA1, DICER1, DIS3L2, EGFR (c.2369C>T, p.Thr790Met variant only), EPCAM (Deletion/duplication testing only), FH, FLCN, GATA2, GPC3, GREM1 (Promoter region deletion/duplication testing only), HOXB13 (c.251G>A, p.Gly84Glu), HRAS, KIT, MAX, MEN1, MET, MITF (c.952G>A, p.Glu318Lys variant only), MLH1, MSH2, MSH3, MSH6, MUTYH, NBN, NF1, NF2, NTHL1, PALB2, PDGFRA, PHOX2B, PMS2, POLD1, POLE, POT1, PRKAR1A, PTCH1, PTEN, RAD50, RAD51C, RAD51D, RB1, RECQL4, RET, RUNX1, SDHAF2, SDHA (sequence changes only), SDHB, SDHC, SDHD, SMAD4, SMARCA4, SMARCB1, SMARCE1, STK11, SUFU, TERC, TERT, TMEM127, TP53, TSC1, TSC2, VHL, WRN and WT1.    Based on Ms. Kirschenmann's family history of cancer, she meets medical criteria for genetic testing. Despite that she meets criteria, she may still have an out of pocket cost. We discussed that if her out of pocket cost for testing is over $100, the laboratory will call and confirm whether she wants to proceed with  testing.  If the out of pocket cost of testing is less than $100 she will be billed by the genetic testing laboratory.   PLAN: After considering the risks, benefits, and limitations, Ms. Petersen provided informed consent to pursue genetic testing.  Blood will be drawn on March 3, and the blood sample will be sent to [facility] for analysis of the PTCH1 and multi-cancer panel. Results should be available within approximately 2-3 weeks' time, at which point they will be disclosed by telephone to Ms. Towles, as will any additional recommendations warranted by these results. Ms. Lora will receive a summary of her genetic counseling visit and a copy of her results once available. This information will also be available in Epic.   Lastly, we encouraged Ms. Gabler to remain in contact with cancer genetics annually so that we can continuously update the family history and inform her of any changes in cancer genetics and testing that may be of benefit for this family.   Ms. Molla questions were answered to her satisfaction today. Our contact information was provided should additional questions or concerns arise. Thank you for the referral and allowing [place] to share in the care of your patient.   Amiayah Giebel P. Florene [place], [place], [facility] Licensed, Insurance risk surveyor [email or web address] phone: [phone]  The patient was seen for a total of 45 minutes in face-to-face genetic counseling.  This patient was discussed with Drs. Magrinat, Lindi Adie and/or Burr Medico who agrees with the above.    _______________________________________________________________________ For Office Staff:  Number of people involved in session: 1 Was an Intern/ student involved with case: yes, Fredrik Rigger

## 2020-07-01 ENCOUNTER — Inpatient Hospital Stay: Payer: Managed Care, Other (non HMO) | Attending: Obstetrics and Gynecology

## 2020-07-01 ENCOUNTER — Ambulatory Visit: Payer: Managed Care, Other (non HMO) | Admitting: Obstetrics and Gynecology

## 2020-07-01 ENCOUNTER — Encounter: Payer: Self-pay | Admitting: Obstetrics and Gynecology

## 2020-07-01 ENCOUNTER — Other Ambulatory Visit: Payer: Self-pay

## 2020-07-01 VITALS — BP 113/67 | HR 89 | Resp 16 | Ht 67.0 in | Wt 157.0 lb

## 2020-07-01 DIAGNOSIS — Z808 Family history of malignant neoplasm of other organs or systems: Secondary | ICD-10-CM

## 2020-07-01 DIAGNOSIS — Z3169 Encounter for other general counseling and advice on procreation: Secondary | ICD-10-CM | POA: Diagnosis not present

## 2020-07-01 LAB — GENETIC SCREENING ORDER

## 2020-07-02 ENCOUNTER — Encounter: Payer: Self-pay | Admitting: Genetic Counselor

## 2020-07-02 ENCOUNTER — Encounter: Payer: Self-pay | Admitting: Obstetrics and Gynecology

## 2020-07-02 DIAGNOSIS — Z8489 Family history of other specified conditions: Secondary | ICD-10-CM | POA: Insufficient documentation

## 2020-07-02 NOTE — Progress Notes (Signed)
   GYNECOLOGY OFFICE NOTE  History:  35 y.o. G0P0000 here today for discussion of preconception counseling. Currently using condoms for contraception. Would like to begin trying to get pregnant in the near future. Married to a man. She has never been pregnant, husband has not fathered any children.  She is having some genetic testing done as her sister has Gorlin syndrome.  Past Medical History:  Diagnosis Date  . Chronic back pain   . Family history of nonmelanoma skin cancer   . Knee pain     Past Surgical History:  Procedure Laterality Date  . WISDOM TOOTH EXTRACTION      Current Outpatient Medications:  .  ASHWAGANDHA PO, Take by mouth., Disp: , Rfl:  .  FEXOFENADINE HCL PO, , Disp: , Rfl:  .  ibuprofen (ADVIL) 200 MG tablet, Take 200 mg by mouth every 6 (six) hours as needed., Disp: , Rfl:  .  Multiple Vitamins-Minerals (ONE-A-DAY WOMENS PO), Take 1 tablet by mouth. Takes about 4 days a week, Disp: , Rfl:   The following portions of the patient's history were reviewed and updated as appropriate: allergies, current medications, past family history, past medical history, past social history, past surgical history and problem list.   Review of Systems:  Pertinent items noted in HPI and remainder of comprehensive ROS otherwise negative.   Objective:  Physical Exam BP 113/67   Pulse 89   Resp 16   Ht 5\' 7"  (1.702 m)   Wt 157 lb (71.2 kg)   LMP 06/08/2020   BMI 24.59 kg/m  CONSTITUTIONAL: Well-developed, well-nourished female in no acute distress.  HENT:  Normocephalic, atraumatic. External right and left ear normal. Oropharynx is clear and moist EYES: Conjunctivae and EOM are normal. Pupils are equal, round, and reactive to light. No scleral icterus.  NECK: Normal range of motion, supple, no masses SKIN: Skin is warm and dry. No rash noted. Not diaphoretic. No erythema. No pallor. NEUROLOGIC: Alert and oriented to person, place, and time. Normal reflexes, muscle tone  coordination. No cranial nerve deficit noted. PSYCHIATRIC: Normal mood and affect. Normal behavior. Normal judgment and thought content. CARDIOVASCULAR: Normal heart rate noted RESPIRATORY: Effort normal, no problems with respiration noted ABDOMEN: Soft, no distention noted.   PELVIC: deferred MUSCULOSKELETAL: Normal range of motion. No edema noted.   Labs and Imaging No results found.  Assessment & Plan:  1. Encounter for preconception consultation - reviewed/encouraged healthy habits including exercise, maintaining healthy weight, not smoking, not using drugs, no excessive alcohol use, no need for restrictive diet at this point - reviewed strategies for achieving pregnancy including timed intercourse based on ovulation predictor kits or temperature charting vs having intercourse every other day to maximize sperm presence in reproductive tract - encouraged prenatal vitamins - may stop using condoms whenever she is ready to start trying - reviewed it may take up to 12 months to achieve pregnancy, about 85% of couples will achieve pregnancy within 12 months, if no pregnancy, may refer to REI at that point - she verbalizes understanding and is in agreement with the above  Routine preventative health maintenance measures emphasized. Please refer to After Visit Summary for other counseling recommendations.   Return if symptoms worsen or fail to improve.  Total face-to-face time with patient: 25 minutes. Over 50% of encounter was spent on counseling and coordination of care.  Feliz Beam, MD, [place] for [facility])

## 2020-07-12 ENCOUNTER — Ambulatory Visit: Payer: Self-pay | Admitting: Genetic Counselor

## 2020-07-12 ENCOUNTER — Telehealth: Payer: Self-pay | Admitting: Genetic Counselor

## 2020-07-12 DIAGNOSIS — Z1379 Encounter for other screening for genetic and chromosomal anomalies: Secondary | ICD-10-CM

## 2020-07-12 NOTE — Telephone Encounter (Signed)
LM on VM that results are back and to please call. 

## 2020-07-12 NOTE — Progress Notes (Signed)
HPI:  Brittney Mays was previously seen in the [facility] due to a family history of cancer and concerns regarding a hereditary predisposition to cancer. Please refer to our prior cancer genetics clinic note for more information regarding our discussion, assessment and recommendations, at the time. Brittney Mays recent genetic test results were disclosed to her, as were recommendations warranted by these results. These results and recommendations are discussed in more detail below.  CANCER HISTORY:  Oncology History   No history exists.    FAMILY HISTORY:  We obtained a detailed, 4-generation family history.  Significant diagnoses are listed below: Family History  Problem Relation Age of Onset  . Hypertension Mother   . Other Mother        acoustic neuroma  . Cancer Sister        Gorlin syndrome  . Leukemia Paternal Uncle 42  . Depression Maternal Grandmother   . Leukemia Maternal Grandmother 28  . Other Maternal Grandmother        'forked ribs', ? gorlin syndrome  . Diabetes Paternal Grandfather   . Other [place]  . Down syndrome Cousin   . Neuropathy Neg Hx     The patient does not have children.  She has a brother and sister.  Her sister has had multiple BCC and has a diagnosis of Gorlin syndrome.  Her father is deceased and her mother is living.  The patient's mother was diagnosed with an acoustic neuroma at age 83.  She has one sister who is cancer free.  The maternal grandparents are deceased.  The grandmother had leukemia, and was thought to possibly have Gorlin syndrome due to having 'forked ribs'.  The patient's father died in an accident at age 94.  He had two brothers and two sisters. One brother died of leukemia at 27.  The grandparents are deceased.  Brittney Mays is aware of previous family history of genetic testing for hereditary cancer risks. Patient's maternal ancestors are of [place] descent, and paternal  ancestors are of [place] and [place] descent. There is no reported Ashkenazi Jewish ancestry. There is no known consanguinity.     GENETIC TEST RESULTS: Genetic testing reported out on July 11, 2020 through the Multi-cancer panel found no pathogenic mutations. The Multi-Gene Panel offered by [facility] includes sequencing and/or deletion duplication testing of the following 84 genes: AIP, ALK, APC, ATM, AXIN2,BAP1,  BARD1, BLM, BMPR1A, BRCA1, BRCA2, BRIP1, CASR, CDC73, CDH1, CDK4, CDKN1B, CDKN1C, CDKN2A (p14ARF), CDKN2A (p16INK4a), CEBPA, CHEK2, CTNNA1, DICER1, DIS3L2, EGFR (c.2369C>T, p.Thr790Met variant only), EPCAM (Deletion/duplication testing only), FH, FLCN, GATA2, GPC3, GREM1 (Promoter region deletion/duplication testing only), HOXB13 (c.251G>A, p.Gly84Glu), HRAS, KIT, MAX, MEN1, MET, MITF (c.952G>A, p.Glu318Lys variant only), MLH1, MSH2, MSH3, MSH6, MUTYH, NBN, NF1, NF2, NTHL1, PALB2, PDGFRA, PHOX2B, PMS2, POLD1, POLE, POT1, PRKAR1A, PTCH1, PTEN, RAD50, RAD51C, RAD51D, RB1, RECQL4, RET, RUNX1, SDHAF2, SDHA (sequence changes only), SDHB, SDHC, SDHD, SMAD4, SMARCA4, SMARCB1, SMARCE1, STK11, SUFU, TERC, TERT, TMEM127, TP53, TSC1, TSC2, VHL, WRN and WT. The test report has been scanned into EPIC and is located under the Molecular Pathology section of the Results Review tab.  A portion of the result report is included below for reference.     We discussed with Brittney Mays that because current genetic testing is not perfect, it is possible there may be a gene mutation in one of these genes that current testing cannot detect, but that chance is small.  We also  discussed, that there could be another gene that has not yet been discovered, or that we have not yet tested, that is responsible for the cancer diagnoses in the family. It is also possible there is a hereditary cause for the cancer in the family that Brittney Mays did not inherit and therefore was not identified in her testing.  Therefore, it is  important to remain in touch with cancer genetics in the future so that we can continue to offer Brittney Mays the most up to date genetic testing.   Genetic testing did identify a variant of uncertain significance (VUS) was identified in the EGFR gene called c.2836A>G.  At this time, it is unknown if this variant is associated with increased cancer risk or if this is a normal finding, but most variants such as this get reclassified to being inconsequential. It should not be used to make medical management decisions. With time, we suspect the lab will determine the significance of this variant, if any. If we do learn more about it, we will try to contact Brittney Mays to discuss it further. However, it is important to stay in touch with [place] periodically and keep the address and phone number up to date.  ADDITIONAL GENETIC TESTING: We discussed with Brittney Mays that her genetic testing was fairly extensive.  If there are genes identified to increase cancer risk that can be analyzed in the future, we would be happy to discuss and coordinate this testing at that time.    CANCER SCREENING RECOMMENDATIONS: Brittney Mays test result is considered negative (normal).  This means that we have not identified a hereditary cause for her family history of cancer, and a known PTCH1 gene mutation at this time. Most cancers happen by chance and this negative test suggests that her cancer may fall into this category.    While reassuring, this does not definitively rule out a hereditary predisposition to cancer. It is still possible that there could be genetic mutations that are undetectable by current technology. There could be genetic mutations in genes that have not been tested or identified to increase cancer risk.  Therefore, it is recommended she continue to follow the cancer management and screening guidelines provided by her primary healthcare provider.   An individual's cancer risk and medical management are not  determined by genetic test results alone. Overall cancer risk assessment incorporates additional factors, including personal medical history, family history, and any available genetic information that may result in a personalized plan for cancer prevention and surveillance  RECOMMENDATIONS FOR FAMILY MEMBERS:  Individuals in this family might be at some increased risk of developing cancer, over the general population risk, simply due to the family history of cancer.  We recommended women in this family have a yearly mammogram beginning at age 75, or 68 years younger than the earliest onset of cancer, an annual clinical breast exam, and perform monthly breast self-exams. Women in this family should also have a gynecological exam as recommended by their primary provider. All family members should be referred for colonoscopy starting at age 55.  FOLLOW-UP: Lastly, we discussed with Brittney Mays that cancer genetics is a rapidly advancing field and it is possible that new genetic tests will be appropriate for her and/or her family members in the future. We encouraged her to remain in contact with cancer genetics on an annual basis so we can update her personal and family histories and let her know of advances in cancer genetics that may benefit this family.  Our contact number was provided. Brittney Mays questions were answered to her satisfaction, and she knows she is welcome to call us at anytime with additional questions or concerns.   Roma Kayser, [place], [facility] Licensed, Certified Genetic Counselor [email or web address]

## 2020-07-12 NOTE — Telephone Encounter (Signed)
Revealed that Brittney Mays does not have the pathogenic mutation in PTCH1 that was identified in her sister.  She was negative for hereditary mutations on the multi-cancer panel.  There was one VUS identified that will not change medical management.

## 2021-01-10 NOTE — Progress Notes (Signed)
Acute Office Visit  Subjective:    Patient ID: Brittney Mays, female    DOB: 05/06/1985, 35 y.o.   MRN: [id number]  Chief Complaint  Patient presents with   Rash    HPI Patient is in today for rash and sensitivity to left hip.  Patient states that Friday she noticed an area of superficial sensitivity to her left hip that was tender.  Initially she thought it was just from laying on the seam of her pants however did not get better throughout the day.  Within the next 2 days she was having a strange pain/sensitivity/tingling feeling across her left hip slightly radiating into her upper leg and groin.  Yesterday she also noticed a rash start on her hip in the area of sensitivity.  She states the area is " blotchy with some red raised spots and it looks like a few blisters may be starting."  She describes the sensation as tender, more sensitive than usual, pain 5 out of 10, sunburn like sensation.  Rash does not cross midline. She denies any itchiness, fevers, other rashes, malaise, lymphadenopathy. Reports she did have chickenpox as a kid.    Past Medical History:  Diagnosis Date   Chronic back pain    Family history of nonmelanoma skin cancer    Knee pain     Past Surgical History:  Procedure Laterality Date   WISDOM TOOTH EXTRACTION      Family History  Problem Relation Age of Onset   Hypertension Mother    Other Mother        acoustic neuroma   Cancer Sister        Gorlin syndrome   Leukemia Paternal Uncle 73   Depression Maternal Grandmother    Leukemia Maternal Grandmother 55   Other Maternal Grandmother        'forked ribs', ? gorlin syndrome   Diabetes Paternal Grandfather    Other Cousin        [name]   Down syndrome Cousin    Neuropathy Neg Hx     Social History   Socioeconomic History   Marital status: Married    Spouse name: Not on file   Number of children: 0   Years of education: Not on file   Highest education level: Bachelor's degree  (e.g., BA, AB, BS)  Occupational History   Not on file  Tobacco Use   Smoking status: Never   Smokeless tobacco: Never  Vaping Use   Vaping Use: Never used  Substance and Sexual Activity   Alcohol use: Yes    Alcohol/week: 2.0 standard drinks    Types: 2 Standard drinks or equivalent per week   Drug use: Never   Sexual activity: Yes    Birth control/protection: Condom  Other Topics Concern   Not on file  Social History Narrative   Lives at home with husband   Right handed   Caffeine: 1 cup/day   Social Determinants of Health   Financial Resource Strain: Not on file  Food Insecurity: Not on file  Transportation Needs: Not on file  Physical Activity: Not on file  Stress: Not on file  Social Connections: Not on file  Intimate Partner Violence: Not on file    Outpatient Medications Prior to Visit  Medication Sig Dispense Refill   ASHWAGANDHA PO Take by mouth.     FEXOFENADINE HCL PO      ibuprofen (ADVIL) 200 MG tablet Take 200 mg by mouth every 6 (six) hours  as needed.     Prenatal Vit-Fe Fumarate-FA (MULTIVITAMIN-PRENATAL) 27-0.8 MG TABS tablet Take 1 tablet by mouth daily at 12 noon.     Multiple Vitamins-Minerals (ONE-A-DAY WOMENS PO) Take 1 tablet by mouth. Takes about 4 days a week     No facility-administered medications prior to visit.    Allergies  Allergen Reactions   Amoxicillin Hives    Review of Systems All review of systems negative except what is listed in the HPI     Objective:    Physical Exam Vitals reviewed.  Constitutional:      Appearance: Normal appearance.  HENT:     Head: Normocephalic and atraumatic.  Cardiovascular:     Rate and Rhythm: Normal rate and regular rhythm.     Heart sounds: Normal heart sounds.  Pulmonary:     Effort: Pulmonary effort is normal.     Breath sounds: Normal breath sounds.  Musculoskeletal:        General: Normal range of motion.  Skin:    Findings: Rash present.     Comments: Erythematous papules  with a few areas of grouped vesicles to L hip/groin, approximately L1-L2 dermatomes, not crossing midline  Neurological:     Mental Status: She is alert and oriented to person, place, and time. Mental status is at baseline.  Psychiatric:        Mood and Affect: Mood normal.        Behavior: Behavior normal.        Thought Content: Thought content normal.        Judgment: Judgment normal.    BP 114/66   Pulse 92   Resp 17   SpO2 98%  Wt Readings from Last 3 Encounters:  07/01/20 157 lb (71.2 kg)  06/08/20 157 lb 1.3 oz (71.3 kg)  04/15/19 152 lb (68.9 kg)    Health Maintenance Due  Topic Date Due   Hepatitis C Screening  Never done   PAP SMEAR-Modifier  Never done   INFLUENZA VACCINE  11/29/2020    There are no preventive care reminders to display for this patient.   Lab Results  Component Value Date   TSH 2.100 04/15/2019   Lab Results  Component Value Date   WBC 5.0 06/08/2020   HGB 13.9 06/08/2020   HCT 40.9 06/08/2020   MCV 96.9 06/08/2020   PLT 204 06/08/2020   Lab Results  Component Value Date   NA 142 06/08/2020   K 4.7 06/08/2020   CO2 32 06/08/2020   GLUCOSE 68 06/08/2020   BUN 13 06/08/2020   CREATININE 0.89 06/08/2020   BILITOT 0.7 06/08/2020   ALKPHOS 45 04/15/2019   AST 20 06/08/2020   ALT 17 06/08/2020   PROT 6.7 06/08/2020   ALBUMIN 4.5 04/15/2019   CALCIUM 9.5 06/08/2020   Lab Results  Component Value Date   CHOL 158 06/08/2020   Lab Results  Component Value Date   HDL 52 06/08/2020   Lab Results  Component Value Date   LDLCALC 89 06/08/2020   Lab Results  Component Value Date   TRIG 77 06/08/2020   Lab Results  Component Value Date   CHOLHDL 3.0 06/08/2020   Lab Results  Component Value Date   HGBA1C 5 08/27/2017       Assessment & Plan:   1. Herpes zoster without complication History and presentation consistent with shingles. Valtrex sent in. Recommended tylenol or ibuprofen for discomfort. Can use topical  hydrocortisone or calamine lotion as well as  Vaseline gauze over areas that are irritated by clothing. Educated on course of illness - handout provided. Patient aware of signs/symptoms requiring further/urgent evaluation.  - valACYclovir (VALTREX) 1000 MG tablet; Take 1 tablet (1,000 mg total) by mouth 3 (three) times daily for 7 days.  Dispense: 21 tablet; Refill: 0  Follow-up if symptoms worsen or fail to improve.   Purcell Nails Olevia Bowens, DNP, FNP-C

## 2021-01-11 ENCOUNTER — Other Ambulatory Visit: Payer: Self-pay

## 2021-01-11 ENCOUNTER — Ambulatory Visit (INDEPENDENT_AMBULATORY_CARE_PROVIDER_SITE_OTHER): Payer: Managed Care, Other (non HMO) | Admitting: Family Medicine

## 2021-01-11 ENCOUNTER — Encounter: Payer: Self-pay | Admitting: Family Medicine

## 2021-01-11 VITALS — BP 114/66 | HR 92 | Resp 17

## 2021-01-11 DIAGNOSIS — B029 Zoster without complications: Secondary | ICD-10-CM | POA: Diagnosis not present

## 2021-01-11 MED ORDER — VALACYCLOVIR HCL 1 G PO TABS
1000.0000 mg | ORAL_TABLET | Freq: Three times a day (TID) | ORAL | 0 refills | Status: AC
Start: 2021-01-11 — End: 2021-01-18

## 2021-02-10 ENCOUNTER — Encounter: Payer: Self-pay | Admitting: Family Medicine

## 2021-02-10 ENCOUNTER — Ambulatory Visit (INDEPENDENT_AMBULATORY_CARE_PROVIDER_SITE_OTHER): Payer: Managed Care, Other (non HMO) | Admitting: Family Medicine

## 2021-02-10 DIAGNOSIS — Z9109 Other allergy status, other than to drugs and biological substances: Secondary | ICD-10-CM | POA: Insufficient documentation

## 2021-02-10 DIAGNOSIS — K219 Gastro-esophageal reflux disease without esophagitis: Secondary | ICD-10-CM | POA: Diagnosis not present

## 2021-02-10 DIAGNOSIS — F411 Generalized anxiety disorder: Secondary | ICD-10-CM | POA: Diagnosis not present

## 2021-02-10 NOTE — Progress Notes (Signed)
Brittney Mays - 35 y.o. female MRN [id number]  Date of birth: 1985/09/15  Subjective Chief Complaint  Patient presents with   Allergies   Gastroesophageal Reflux   Stress    HPI Brittney Mays is a 35 year old female here today for initial visit with me.  She is transferring care from Dr. Sheppard Coil.  She was recently seen by Lovena Le in our office and diagnosed with shingles.  This is cleared up but still has mild residual pain.  She has complaint today of of allergies.  She reports allergies throughout the year.  These consist of nasal congestion with postnasal drainage, mild cough and hoarseness.  She is taking fexofenadine daily.  She has been taking this for a couple of years.  She does not feel like this is quite as effective as it had been in the past.  She has never seen an allergist or had allergy testing.  She also reports increased reflux symptoms.  She has not tried anything for this so far.  Menstrual cycle was abnormal this past month.  She reports that cycles are typically normal so this was a bit of a concern for her.  She did recently receive her COVID booster.  She admits to some increased stress and anxiety.  She is seeing a counselor available through her employer.  She is looking into the EAP program as well.  ROS:  A comprehensive ROS was completed and negative except as noted per HPI  Allergies  Allergen Reactions   Amoxicillin Hives    Past Medical History:  Diagnosis Date   Chronic back pain    Family history of nonmelanoma skin cancer    Knee pain     Past Surgical History:  Procedure Laterality Date   WISDOM TOOTH EXTRACTION      Social History   Socioeconomic History   Marital status: Married    Spouse name: Not on file   Number of children: 0   Years of education: Not on file   Highest education level: Bachelor's degree (e.g., BA, AB, BS)  Occupational History   Not on file  Tobacco Use   Smoking status: Never   Smokeless tobacco: Never   Vaping Use   Vaping Use: Never used  Substance and Sexual Activity   Alcohol use: Yes    Alcohol/week: 2.0 standard drinks    Types: 2 Standard drinks or equivalent per week   Drug use: Never   Sexual activity: Yes    Birth control/protection: Condom  Other Topics Concern   Not on file  Social History Narrative   Lives at home with husband   Right handed   Caffeine: 1 cup/day   Social Determinants of Health   Financial Resource Strain: Not on file  Food Insecurity: Not on file  Transportation Needs: Not on file  Physical Activity: Not on file  Stress: Not on file  Social Connections: Not on file    Family History  Problem Relation Age of Onset   Hypertension Mother    Other Mother        acoustic neuroma   Cancer Sister        Gorlin syndrome   Leukemia Paternal Uncle 75   Depression Maternal Grandmother    Leukemia Maternal Grandmother 56   Other Maternal Grandmother        'forked ribs', ? gorlin syndrome   Diabetes Paternal Grandfather    Other [place]   Down syndrome Cousin  Neuropathy Neg Hx     Health Maintenance  Topic Date Due   Hepatitis C Screening  Never done   PAP SMEAR-Modifier  05/16/2021 (Originally 07/01/2007)   TETANUS/TDAP  12/06/2024   INFLUENZA VACCINE  Completed   COVID-19 Vaccine  Completed   HIV Screening  Completed   HPV VACCINES  Aged Out     ----------------------------------------------------------------------------------------------------------------------------------------------------------------------------------------------------------------- Physical Exam BP 118/72 (BP Location: Left Arm, Patient Position: Sitting, Cuff Size: Normal)   Pulse 99   Temp 98.3 F (36.8 C) (Oral)   Ht 5\' 7"  (1.702 m)   Wt 155 lb (70.3 kg)   LMP 02/10/2021 (Exact Date)   SpO2 98%   BMI 24.28 kg/m   Physical Exam Constitutional:      Appearance: Normal appearance.  HENT:     Nose: Congestion present.  Eyes:      General: No scleral icterus. Cardiovascular:     Rate and Rhythm: Normal rate and regular rhythm.  Pulmonary:     Effort: Pulmonary effort is normal.     Breath sounds: Normal breath sounds.  Musculoskeletal:     Cervical back: Neck supple.  Neurological:     Mental Status: She is alert.  Psychiatric:        Mood and Affect: Mood normal.        Behavior: Behavior normal.    ------------------------------------------------------------------------------------------------------------------------------------------------------------------------------------------------------------------- Assessment and Plan  Anxiety state She will continue counseling through her employee.  Encouraged to look into EAP program as well.  Environmental allergies She has perennial allergies that are not well controlled at this time.  Recommend changing from Allegra to cetirizine.  Recommend adding Flonase as well.  If she is not getting improvement with this we can get her set up with an allergist for formal allergy testing and consideration of allergy shots.  Acid reflux Recommend adding famotidine initially.  We also discussed dietary changes which may be helpful in controlling acid.   No orders of the defined types were placed in this encounter.   No follow-ups on file.    This visit occurred during the SARS-CoV-2 public health emergency.  Safety protocols were in place, including screening questions prior to the visit, additional usage of staff PPE, and extensive cleaning of exam room while observing appropriate contact time as indicated for disinfecting solutions.

## 2021-02-10 NOTE — Assessment & Plan Note (Signed)
She will continue counseling through her employee.  Encouraged to look into EAP program as well.

## 2021-02-10 NOTE — Assessment & Plan Note (Signed)
She has perennial allergies that are not well controlled at this time.  Recommend changing from Allegra to cetirizine.  Recommend adding Flonase as well.  If she is not getting improvement with this we can get her set up with an allergist for formal allergy testing and consideration of allergy shots.

## 2021-02-10 NOTE — Assessment & Plan Note (Signed)
Recommend adding famotidine initially.  We also discussed dietary changes which may be helpful in controlling acid.

## 2021-02-10 NOTE — Patient Instructions (Signed)
Great to meet you today!  Try change to cetirizine and flonase for allergies.  If not improvement after a couple of weeks let me know.   Try adding pepcid (famotidine) as needed for reflux.   Let me know if menstrual abnormalities persist.   Managing Stress, Adult Feeling a certain amount of stress is normal. Stress helps our body and mind get ready to deal with the demands of life. Stress hormones can motivate you to do well at work and meet your responsibilities. However severe or long-lasting (chronic) stress can affect your mental and physical health. Chronic stress puts you at higher risk for anxiety, depression, and other health problems like digestive problems, muscle aches, heart disease, high blood pressure, and stroke. What are the causes? Common causes of stress include: Demands from work, such as deadlines, feeling overworked, or having long hours. Pressures at home, such as money issues, disagreements with a spouse, or parenting issues. Pressures from major life changes, such as divorce, moving, loss of a loved one, or chronic illness. You may be at higher risk for stress-related problems if you do not get enough sleep, are in poor health, do not have emotional support, or have a mental health disorder like anxiety or depression. How to recognize stress Stress can make you: Have trouble sleeping. Feel sad, anxious, irritable, or overwhelmed. Lose your appetite. Overeat or want to eat unhealthy foods. Want to use drugs or alcohol. Stress can also cause physical symptoms, such as: Sore, tense muscles, especially in the shoulders and neck. Headaches. Trouble breathing. A faster heart rate. Stomach pain, nausea, or vomiting. Diarrhea or constipation. Trouble concentrating. Follow these instructions at home: Lifestyle Identify the source of your stress and your reaction to it. See a therapist who can help you change your reactions. When there are stressful events: Talk  about it with family, friends, or co-workers. Try to think realistically about stressful events and not ignore them or overreact. Try to find the positives in a stressful situation and not focus on the negatives. Cut back on responsibilities at work and home, if possible. Ask for help from friends or family members if you need it. Find ways to cope with stress, such as: Meditation. Deep breathing. Yoga or tai chi. Progressive muscle relaxation. Doing art, playing music, or reading. Making time for fun activities. Spending time with family and friends. Get support from family, friends, or spiritual resources. Eating and drinking Eat a healthy diet. This includes: Eating foods that are high in fiber, such as beans, whole grains, and fresh fruits and vegetables. Limiting foods that are high in fat and processed sugars, such as fried and sweet foods. Do not skip meals or overeat. Drink enough fluid to keep your urine pale yellow. Alcohol use Do not drink alcohol if: Your health care provider tells you not to drink. You are pregnant, may be pregnant, or are planning to become pregnant. Drinking alcohol is a way some people try to ease their stress. This can be dangerous, so if you drink alcohol: Limit how much you use to: 0-1 drink a day for women. 0-2 drinks a day for men. Be aware of how much alcohol is in your drink. In the [place] one drink equals one 12 oz bottle of beer (355 mL), one 5 oz glass of wine (148 mL), or one 1 oz glass of hard liquor (44 mL). Activity  Include 30 minutes of exercise in your daily schedule. Exercise is a good stress reducer. Include time in  your day for an activity that you find relaxing. Try taking a walk, going on a bike ride, reading a book, or listening to music. Schedule your time in a way that lowers stress, and keep a consistent schedule. Prioritize what is most important to get done. General instructions Get enough sleep. Try to go to sleep and get  up at about the same time every day. Take over-the-counter and prescription medicines only as told by your health care provider. Do not use any products that contain nicotine or tobacco, such as cigarettes, e-cigarettes, and chewing tobacco. If you need help quitting, ask your health care provider. Do not use drugs or smoke to cope with stress. Keep all follow-up visits as told by your health care provider. This is important. Where to find support Talk with your health care provider about stress management or finding a support group. Find a therapist to work with you on your stress management techniques. Contact a health care provider if: Your stress symptoms get worse. You are unable to manage your stress at home. You are struggling to stop using drugs or alcohol. Get help right away if: You may be a danger to yourself or others. You have any thoughts of death or suicide. If you ever feel like you may hurt yourself or others, or have thoughts about taking your own life, get help right away. You can go to your nearest emergency department or call: Your local emergency services (911 in the [place] A suicide crisis helpline, such as the [place] at 305 695 8280. This is open 24 hours a day. Summary Feeling a certain amount of stress is normal, but severe or long-lasting (chronic) stress can affect your mental and physical health. Chronic stress can put you at higher risk for anxiety, depression, and other health problems like digestive problems, muscle aches, heart disease, high blood pressure, and stroke. You may be at higher risk for stress-related problems if you do not get enough sleep, are in poor health, lack emotional support, or have a mental health disorder like anxiety or depression. Identify the source of your stress and your reaction to it. Try talking about stressful events with family, friends, or co-workers, finding a coping method, or getting support  from spiritual resources. If you need more help, talk with your health care provider about finding a support group or a mental health therapist. This information is not intended to replace advice given to you by your health care provider. Make sure you discuss any questions you have with your health care provider. Document Revised: 06/25/2020 Document Reviewed: 11/13/2018 Elsevier Patient Education  [place].

## 2021-06-02 ENCOUNTER — Ambulatory Visit: Payer: Managed Care, Other (non HMO) | Admitting: Family Medicine

## 2021-06-14 ENCOUNTER — Other Ambulatory Visit: Payer: Self-pay

## 2021-06-14 ENCOUNTER — Encounter: Payer: Self-pay | Admitting: Family Medicine

## 2021-06-14 ENCOUNTER — Ambulatory Visit: Payer: Managed Care, Other (non HMO) | Admitting: Family Medicine

## 2021-06-14 DIAGNOSIS — N979 Female infertility, unspecified: Secondary | ICD-10-CM | POA: Insufficient documentation

## 2021-06-14 DIAGNOSIS — N939 Abnormal uterine and vaginal bleeding, unspecified: Secondary | ICD-10-CM | POA: Diagnosis not present

## 2021-06-14 NOTE — Progress Notes (Signed)
Period  23-35 days with intermenstrual bleeding.   Brittney Mays - 36 y.o. female MRN [id number]  Date of birth: 22-Oct-1985  Subjective No chief complaint on file.   HPI Brittney Mays is a 36 year old female here today to discuss infertility as well as irregular menstrual cycle.  Reports that she and her husband have been trying to get pregnant for over a year without any success.  She has noticed that her menstrual cycles have been somewhat irregular over the past year.  She has been tracking her periods which range from 23 to 35-day intervals.  Duration of.  Has not really changed however she has had some intermenstrual bleeding.  She denies any increased pain with her menstrual periods.  ROS:  A comprehensive ROS was completed and negative except as noted per HPI  Allergies  Allergen Reactions   Amoxicillin Hives    Past Medical History:  Diagnosis Date   Chronic back pain    Family history of nonmelanoma skin cancer    Knee pain     Past Surgical History:  Procedure Laterality Date   WISDOM TOOTH EXTRACTION      Social History   Socioeconomic History   Marital status: Married    Spouse name: Not on file   Number of children: 0   Years of education: Not on file   Highest education level: Bachelor's degree (e.g., BA, AB, BS)  Occupational History   Not on file  Tobacco Use   Smoking status: Never   Smokeless tobacco: Never  Vaping Use   Vaping Use: Never used  Substance and Sexual Activity   Alcohol use: Yes    Alcohol/week: 2.0 standard drinks    Types: 2 Standard drinks or equivalent per week   Drug use: Never   Sexual activity: Yes    Birth control/protection: Condom  Other Topics Concern   Not on file  Social History Narrative   Lives at home with husband   Right handed   Caffeine: 1 cup/day   Social Determinants of Health   Financial Resource Strain: Not on file  Food Insecurity: Not on file  Transportation Needs: Not on file  Physical Activity: Not  on file  Stress: Not on file  Social Connections: Not on file    Family History  Problem Relation Age of Onset   Hypertension Mother    Other Mother        acoustic neuroma   Cancer Sister        Gorlin syndrome   Leukemia Paternal Uncle 9   Depression Maternal Grandmother    Leukemia Maternal Grandmother 68   Other Maternal Grandmother        'forked ribs', ? gorlin syndrome   Diabetes Paternal Grandfather    Other [place]   Down syndrome Cousin    Neuropathy Neg Hx     Health Maintenance  Topic Date Due   Hepatitis C Screening  Never done   PAP SMEAR-Modifier  Never done   COVID-19 Vaccine (5 - Booster for Moderna series) 07/01/2022 (Originally 04/03/2021)   TETANUS/TDAP  12/06/2024   INFLUENZA VACCINE  Completed   HIV Screening  Completed   HPV VACCINES  Aged Out     ----------------------------------------------------------------------------------------------------------------------------------------------------------------------------------------------------------------- Physical Exam There were no vitals taken for this visit.  Physical Exam Constitutional:      Appearance: Normal appearance.  Eyes:     General: No scleral icterus. Musculoskeletal:     Cervical  back: Neck supple.  Neurological:     Mental Status: She is alert.  Psychiatric:        Mood and Affect: Mood normal.        Behavior: Behavior normal.    ------------------------------------------------------------------------------------------------------------------------------------------------------------------------------------------------------------------- Assessment and Plan  Abnormal uterine bleeding (AUB) She has had some mild abnormal uterine bleeding as well as infertility.  Checking thyroid function today.  We will also get pelvic ultrasound to evaluate for any structural abnormalities.  Discussed if these are normal would recommend referral to GYN or fertility  specialist.   No orders of the defined types were placed in this encounter.   No follow-ups on file.    This visit occurred during the SARS-CoV-2 public health emergency.  Safety protocols were in place, including screening questions prior to the visit, additional usage of staff PPE, and extensive cleaning of exam room while observing appropriate contact time as indicated for disinfecting solutions.

## 2021-06-14 NOTE — Assessment & Plan Note (Signed)
She has had some mild abnormal uterine bleeding as well as infertility.  Checking thyroid function today.  We will also get pelvic ultrasound to evaluate for any structural abnormalities.  Discussed if these are normal would recommend referral to GYN or fertility specialist.

## 2021-06-15 LAB — TSH: TSH: 1.89 mIU/L

## 2021-06-15 LAB — T4, FREE: Free T4: 1.2 ng/dL (ref 0.8–1.8)

## 2021-06-20 ENCOUNTER — Ambulatory Visit (INDEPENDENT_AMBULATORY_CARE_PROVIDER_SITE_OTHER): Payer: Managed Care, Other (non HMO)

## 2021-06-20 ENCOUNTER — Other Ambulatory Visit: Payer: Self-pay

## 2021-06-20 DIAGNOSIS — N979 Female infertility, unspecified: Secondary | ICD-10-CM

## 2021-06-20 DIAGNOSIS — N939 Abnormal uterine and vaginal bleeding, unspecified: Secondary | ICD-10-CM

## 2021-06-24 ENCOUNTER — Encounter: Payer: Self-pay | Admitting: Family Medicine

## 2021-07-04 ENCOUNTER — Other Ambulatory Visit: Payer: Self-pay

## 2021-07-04 ENCOUNTER — Ambulatory Visit: Payer: Managed Care, Other (non HMO) | Admitting: Obstetrics and Gynecology

## 2021-07-04 ENCOUNTER — Encounter: Payer: Self-pay | Admitting: Obstetrics and Gynecology

## 2021-07-04 VITALS — BP 117/75 | HR 84 | Ht 67.0 in | Wt 157.0 lb

## 2021-07-04 DIAGNOSIS — D219 Benign neoplasm of connective and other soft tissue, unspecified: Secondary | ICD-10-CM | POA: Diagnosis not present

## 2021-07-04 DIAGNOSIS — Z3169 Encounter for other general counseling and advice on procreation: Secondary | ICD-10-CM | POA: Diagnosis not present

## 2021-07-04 DIAGNOSIS — N920 Excessive and frequent menstruation with regular cycle: Secondary | ICD-10-CM | POA: Diagnosis not present

## 2021-07-04 NOTE — Progress Notes (Signed)
? ?GYNECOLOGY OFFICE NOTE ? ?History:  ?36 y.o. G0P0000 here today for infertility discussion. She and husband have been trying to achieve pregnancy for 12 months with no success. Cycling regularly lasting 22-31 days, patient has impressive chart showing cycles for last year. They have been having intercourse without protection. She is not using any kind of ovulation tracker.    ? ?Past Medical History:  ?Diagnosis Date  ? Chronic back pain   ? Family history of nonmelanoma skin cancer   ? Knee pain   ? ? ?Past Surgical History:  ?Procedure Laterality Date  ? WISDOM TOOTH EXTRACTION    ? ? ? ?Current Outpatient Medications:  ?  ASHWAGANDHA PO, Take by mouth., Disp: , Rfl:  ?  calcium carbonate (TUMS - DOSED IN MG ELEMENTAL CALCIUM) 500 MG chewable tablet, Chew 1 tablet by mouth daily., Disp: , Rfl:  ?  cetirizine (ZYRTEC) 10 MG tablet, Take 10 mg by mouth daily., Disp: , Rfl:  ?  ibuprofen (ADVIL) 200 MG tablet, Take 200 mg by mouth every 6 (six) hours as needed., Disp: , Rfl:  ?  Prenatal Vit-Fe Fumarate-FA (MULTIVITAMIN-PRENATAL) 27-0.8 MG TABS tablet, Take 1 tablet by mouth daily at 12 noon., Disp: , Rfl:  ?  pseudoephedrine (SUDAFED) 60 MG tablet, Take 60 mg by mouth every 4 (four) hours as needed for congestion., Disp: , Rfl:  ? ?The following portions of the patient's history were reviewed and updated as appropriate: allergies, current medications, past family history, past medical history, past social history, past surgical history and problem list.  ? ?Review of Systems:  ?Pertinent items noted in HPI and remainder of comprehensive ROS otherwise negative. ?  ?Objective:  ?Physical Exam ?BP 117/75   Pulse 84   Ht '5\' 7"'$  (1.702 m)   Wt 157 lb (71.2 kg)   LMP 06/22/2021   BMI 24.59 kg/m?  ?CONSTITUTIONAL: Well-developed, well-nourished female in no acute distress.  ?HENT:  Normocephalic, atraumatic. External right and left ear normal. Oropharynx is clear and moist ?EYES: Conjunctivae and EOM are normal.  Pupils are equal, round, and reactive to light. No scleral icterus.  ?NECK: Normal range of motion, supple, no masses ?SKIN: Skin is warm and dry. No rash noted. Not diaphoretic. No erythema. No pallor. ?NEUROLOGIC: Alert and oriented to person, place, and time. Normal reflexes, muscle tone coordination. No cranial nerve deficit noted. ?PSYCHIATRIC: Normal mood and affect. Normal behavior. Normal judgment and thought content. ?CARDIOVASCULAR: Normal heart rate noted ?RESPIRATORY: Effort normal, no problems with respiration noted ?ABDOMEN: Soft, no distention noted.   ?PELVIC: deferred ?MUSCULOSKELETAL: Normal range of motion. No edema noted. ? ?Labs and Imaging ?US Pelvic Complete With Transvaginal ? ?Result Date: 06/20/2021 ?CLINICAL DATA:  Infertility evaluation, abnormal uterine bleeding, trying for 1 year to conceive, spotting between periods, LMP 05/24/2021, abnormal duration of menstrual cycle from 22-35 days EXAM: TRANSABDOMINAL AND TRANSVAGINAL ULTRASOUND OF PELVIS TECHNIQUE: Both transabdominal and transvaginal ultrasound examinations of the pelvis were performed. Transabdominal technique was performed for global imaging of the pelvis including uterus, ovaries, adnexal regions, and pelvic cul-de-sac. It was necessary to proceed with endovaginal exam following the transabdominal exam to visualize the uterus, endometrium, and ovaries. COMPARISON:  None FINDINGS: Uterus Measurements: 8.0 x 4.4 x 5.7 cm = volume: 104 mL. Retroverted. Anterior wall subserosal leiomyoma 30 x 21 x 27 mm. Additional small submucosal leiomyoma at fundus 13 x 14 x 13 mm. Small patella on Q late it leiomyoma LEFT lateral 13 x 12 x 10 mm.  Several additional tiny intramural leiomyomata are suspected. Endometrium Thickness: 13 mm.  No endometrial fluid or mass Right ovary Measurements: 3.4 x 2.0 x 2.8 cm = volume: 10.1 mL. Normal morphology without mass Left ovary Measurements: 4.1 x 1.7 x 2.1 cm = volume: 7.7 mL. Normal morphology  without mass Other findings Trace free pelvic fluid.  No adnexal masses. IMPRESSION: Multiple small uterine leiomyomata. Remainder of exam unremarkable. Electronically Signed   By: Lavonia Dana M.D.   On: 06/20/2021 19:15   ? ?Assessment & Plan:  ?1. Infertility counseling ?- Patient has been trying for 12 months to achieve pregnancy with no success ?- reviewed this is point at which we would refer to REI if she desires ?- reviewed options for fertility assistance at [facility] ?- would recommend proceeding with some lab work ?- pt declines referral to REI now, will start using ovulation induction kits ?- reviewed age related decline in egg quality and increase in maternal risks in pregnancy, would not recommend delaying fertility assistance for years, she verbalizes understanding and would like to speak with husband, try ovulation predictor kits for now ?- Follicle stimulating hormone ?- Prolactin ?- Hemoglobin A1c ? ?2. Fibroid ?Several small fibroids ?- reviewed management, would not do anything now  ?- may or may not affect ability to get pregnant, with size 3 cm and less, unlikely they are preventing pregnancy ? ? ?3. Short menstrual cycle ?Start testing for [facility] surge earlier in cycle ? ? ?Routine preventative health maintenance measures emphasized. ?Please refer to After Visit Summary for other counseling recommendations.  ? ?Return in about 6 months (around 01/04/2022) for Followup. ? ? ? ?K. Arvilla Meres, MD, FACOG ?Attending ?Center for [employer] [facility]) ? ? ? ?

## 2021-07-05 LAB — PROLACTIN: Prolactin: 13.1 ng/mL

## 2021-07-05 LAB — HEMOGLOBIN A1C
Hgb A1c MFr Bld: 5.1 % of total Hgb (ref <5.7)
Mean Plasma Glucose: 100 mg/dL
eAG (mmol/L): 5.5 mmol/L

## 2021-07-05 LAB — FOLLICLE STIMULATING HORMONE: FSH: 20.1 m[IU]/mL

## 2021-07-12 LAB — TESTOS,TOTAL,FREE AND SHBG (FEMALE)
Free Testosterone: 3 pg/mL (ref 0.1–6.4)
Sex Hormone Binding: 61 nmol/L (ref 17–124)
Testosterone, Total, LC-MS-MS: 30 ng/dL (ref 2–45)

## 2021-08-01 ENCOUNTER — Encounter: Payer: Self-pay | Admitting: Family Medicine

## 2021-08-25 ENCOUNTER — Encounter: Payer: Self-pay | Admitting: Obstetrics and Gynecology

## 2021-11-02 ENCOUNTER — Encounter: Payer: Self-pay | Admitting: *Deleted

## 2021-11-04 ENCOUNTER — Ambulatory Visit (INDEPENDENT_AMBULATORY_CARE_PROVIDER_SITE_OTHER): Payer: 59

## 2021-11-04 ENCOUNTER — Encounter: Payer: Self-pay | Admitting: Obstetrics and Gynecology

## 2021-11-04 ENCOUNTER — Other Ambulatory Visit (HOSPITAL_COMMUNITY)
Admission: RE | Admit: 2021-11-04 | Discharge: 2021-11-04 | Disposition: A | Discharge status: Home / Self Care | Payer: 59 | Source: Ambulatory Visit

## 2021-11-04 VITALS — BP 126/73 | HR 98 | Wt 161.0 lb

## 2021-11-04 DIAGNOSIS — Z3401 Encounter for supervision of normal first pregnancy, first trimester: Secondary | ICD-10-CM | POA: Insufficient documentation

## 2021-11-04 DIAGNOSIS — Z3A12 12 weeks gestation of pregnancy: Secondary | ICD-10-CM | POA: Diagnosis not present

## 2021-11-04 DIAGNOSIS — O09521 Supervision of elderly multigravida, first trimester: Secondary | ICD-10-CM | POA: Diagnosis not present

## 2021-11-04 DIAGNOSIS — M419 Scoliosis, unspecified: Secondary | ICD-10-CM

## 2021-11-04 DIAGNOSIS — Z8659 Personal history of other mental and behavioral disorders: Secondary | ICD-10-CM

## 2021-11-04 DIAGNOSIS — O3680X0 Pregnancy with inconclusive fetal viability, not applicable or unspecified: Secondary | ICD-10-CM

## 2021-11-04 DIAGNOSIS — O09529 Supervision of elderly multigravida, unspecified trimester: Secondary | ICD-10-CM | POA: Insufficient documentation

## 2021-11-04 MED ORDER — ASPIRIN 81 MG PO TBEC: 81.0000 mg | DELAYED_RELEASE_TABLET | Freq: Every day | ORAL | 12 refills | Status: DC

## 2021-11-04 NOTE — Progress Notes (Signed)
Subjective:   Brittney Mays is a 36 y.o. G1P0000 at 64w2dby LMP being seen today for her first obstetrical visit.  Her obstetrical history is significant for advanced maternal age and has Chronic bilateral back pain; Anxiety state; Paresthesias; Myofascial pain; Family history of nonmelanoma skin cancer; Family history of genetic disease; Genetic testing; Mild scoliosis; Environmental allergies; Acid reflux; Abnormal uterine bleeding (AUB); and Infertility, female on their problem list.. Patient does intend to breast feed. Pregnancy history fully reviewed.  Patient reports fatigue. Fatigue improving as she is "down to 1 nap/day" but feels like it has impacted her daily life.   HISTORY: OB History  Gravida Para Term Preterm AB Living  1 0 0 0 0 0  SAB IAB Ectopic Multiple Live Births  0 0 0 0 0    # Outcome Date GA Lbr Len/2nd Weight Sex Delivery Anes PTL Lv  1 Current            Past Medical History:  Diagnosis Date   Chronic back pain    Family history of nonmelanoma skin cancer    Knee pain    Past Surgical History:  Procedure Laterality Date   WISDOM TOOTH EXTRACTION     Family History  Problem Relation Age of Onset   Hypertension Mother    Other Mother        acoustic neuroma   Cancer Sister        Gorlin syndrome   Leukemia Paternal Uncle 275  Depression Maternal Grandmother    Leukemia Maternal Grandmother [phone]  Other Maternal Grandmother        'forked ribs', ? gorlin syndrome   Diabetes Paternal Grandfather    Other Cousin        [name]   Down syndrome Cousin    Neuropathy Neg Hx    Social History   Tobacco Use   Smoking status: Never   Smokeless tobacco: Never  Vaping Use   Vaping Use: Never used  Substance Use Topics   Alcohol use: Yes    Alcohol/week: 2.0 standard drinks of alcohol    Types: 2 Standard drinks or equivalent per week   Drug use: Never   Allergies  Allergen Reactions   Amoxicillin Hives   Current Outpatient  Medications on File Prior to Visit  Medication Sig Dispense Refill   ASHWAGANDHA PO Take by mouth.     calcium carbonate (TUMS - DOSED IN MG ELEMENTAL CALCIUM) 500 MG chewable tablet Chew 1 tablet by mouth daily.     cetirizine (ZYRTEC) 10 MG tablet Take 10 mg by mouth daily.     Prenatal Vit-Fe Fumarate-FA (MULTIVITAMIN-PRENATAL) 27-0.8 MG TABS tablet Take 1 tablet by mouth daily at 12 noon.     No current facility-administered medications on file prior to visit.   Indications for ASA therapy (per uptodate) One of the following: Previous pregnancy with preeclampsia, especially early onset and with an adverse outcome No Multifetal gestation No Chronic hypertension No Type 1 or 2 diabetes mellitus No Chronic kidney disease No Autoimmune disease (antiphospholipid syndrome, systemic lupus erythematosus) No  Two or more of the following: Nulliparity Yes Obesity (body mass index >30 kg/m2) No Family history of preeclampsia in mother or sister No Age ?367years Yes Sociodemographic characteristics (African American race, low socioeconomic level) No Personal risk factors (eg, previous pregnancy with low birth weight or small for gestational age infant, previous adverse pregnancy outcome [eg, stillbirth], interval >10 years between pregnancies)  No  Indications for early 1 hour GTT (per uptodate)  BMI >25 (>23 in Asian women) AND one of the following  Gestational diabetes mellitus in a previous pregnancy No Glycated hemoglobin ?5.7 percent (39 mmol/mol), impaired glucose tolerance, or impaired fasting glucose on previous testing No First-degree relative with diabetes No High-risk race/ethnicity (eg, African American, Latino, Native American, [place] American, Pacific Islander) No History of cardiovascular disease No Hypertension or on therapy for hypertension No High-density lipoprotein cholesterol level <35 mg/dL (0.90 mmol/L) and/or a triglyceride level >250 mg/dL (2.82 mmol/L)  No Polycystic ovary syndrome No Physical inactivity No Other clinical condition associated with insulin resistance (eg, severe obesity, acanthosis nigricans) No Previous birth of an infant weighing ?4000 g No Previous stillbirth of unknown cause No Exam   Vitals:   11/04/21 0942  BP: 126/73  Pulse: 98  Weight: 161 lb (73 kg)   Fetal Heart Rate (bpm): 158  Uterus:     Pelvic Exam: Perineum: no hemorrhoids, normal perineum   Vulva: normal external genitalia, no lesions   Vagina:  normal mucosa, normal discharge   Cervix: no lesions and normal, pap smear done.    Adnexa: normal adnexa and no mass, fullness, tenderness   Bony Pelvis: average  System: General: well-developed, well-nourished female in no acute distress   Breast:  normal appearance, no masses or tenderness   Skin: normal coloration and turgor, no rashes   Neurologic: oriented, normal, negative, normal mood   Extremities: normal strength, tone, and muscle mass, ROM of all joints is normal   HEENT PERRLA, extraocular movement intact and sclera clear, anicteric   Mouth/Teeth mucous membranes moist, pharynx normal without lesions and dental hygiene good   Neck supple and no masses   Cardiovascular: regular rate and rhythm   Respiratory:  no respiratory distress, normal breath sounds   Abdomen: soft, non-tender; bowel sounds normal; no masses,  no organomegaly     Assessment:   Pregnancy: G1P0000 Patient Active Problem List   Diagnosis Date Noted   Abnormal uterine bleeding (AUB) 06/14/2021   Infertility, female 06/14/2021   Environmental allergies 02/10/2021   Acid reflux 02/10/2021   Genetic testing 07/12/2020   Family history of genetic disease 07/02/2020   Family history of nonmelanoma skin cancer    Paresthesias 04/15/2019   Myofascial pain 04/15/2019   Anxiety state 02/12/2018   Chronic bilateral back pain 09/12/2016   Mild scoliosis 07/23/2015     Plan:  1. Encounter to determine fetal viability of  pregnancy, single or unspecified fetus  - US OB Limited; Future - US OB Limited  2. Encounter for supervision of normal first pregnancy in first trimester - New OB. Welcomed to practice.  - Reassurance provided regarding fatigue in pregnancy - Anticipatory guidance for upcoming appointments provided  - Obstetric panel - HIV antibody (with reflex) - Hepatitis C Antibody - Culture, OB Urine - Cytology - PAP( [place]) - Panorama Prenatal Test Full Panel - Horizon 4 (SMA, CF, FRAGILE X, DMD)  3. AMA (advanced maternal age) multigravida 99+, first trimester - bASA  4. Mild scoliosis - No issues currently but has long history of chronic back pain - Tylenol, heat/ice, support belt. May need PT further along in pregnancy which I discussed with patient  5. History of anxiety - No meds, stable - Recently started new job which has helped anxiety - Informed patient of IBH services if she needs  Initial labs drawn. Continue prenatal vitamins. Discussed and offered genetic screening options, including  Quad screen/AFP, NIPS testing, and option to decline testing. Benefits/risks/alternatives reviewed. Pt aware that anatomy US is form of genetic screening with lower accuracy in detecting trisomies than blood work.  Pt chooses/declines genetic screening today. NIPS: ordered. Ultrasound discussed; fetal anatomic survey: requested. Problem list reviewed and updated. The nature of [facility] with multiple MDs and other Advanced Practice Providers was explained to patient; also emphasized that residents, students are part of our team. Routine obstetric precautions reviewed. Return in about 4 weeks (around 12/02/2021).   Renee Harder, CNM 11/04/21 11:40 AM

## 2021-11-04 NOTE — Progress Notes (Signed)
Bedside U/S shows single IUP with FHT of 158 and CRL is consistent with LMP

## 2021-11-04 NOTE — Progress Notes (Signed)
Weight: 161 Height: 5'7" BP: 126/73 P: 98  PHQ 9 Score: 8 GAD 7 Score: 2

## 2021-11-07 ENCOUNTER — Encounter: Payer: Self-pay | Admitting: *Deleted

## 2021-11-07 LAB — CULTURE, OB URINE

## 2021-11-07 LAB — URINE CULTURE, OB REFLEX

## 2021-11-07 LAB — CYTOLOGY - PAP
Chlamydia: NEGATIVE
Comment: NEGATIVE
Comment: NEGATIVE
Comment: NORMAL
Diagnosis: NEGATIVE
High risk HPV: NEGATIVE
Neisseria Gonorrhea: NEGATIVE

## 2021-11-07 NOTE — Progress Notes (Signed)
[facility] results scanned under media tab

## 2021-11-08 ENCOUNTER — Other Ambulatory Visit: Payer: Self-pay | Admitting: *Deleted

## 2021-11-08 ENCOUNTER — Encounter: Payer: Self-pay | Admitting: Obstetrics and Gynecology

## 2021-11-08 DIAGNOSIS — Z3401 Encounter for supervision of normal first pregnancy, first trimester: Secondary | ICD-10-CM

## 2021-11-10 ENCOUNTER — Other Ambulatory Visit: Payer: Self-pay

## 2021-11-15 ENCOUNTER — Other Ambulatory Visit: Payer: Self-pay

## 2021-11-15 DIAGNOSIS — O2342 Unspecified infection of urinary tract in pregnancy, second trimester: Secondary | ICD-10-CM

## 2021-11-15 MED ORDER — NITROFURANTOIN MONOHYD MACRO 100 MG PO CAPS: 100.0000 mg | ORAL_CAPSULE | Freq: Two times a day (BID) | ORAL | 0 refills | Status: DC

## 2021-11-16 ENCOUNTER — Encounter: Payer: Self-pay | Admitting: Obstetrics and Gynecology

## 2021-11-16 LAB — HIV ANTIBODY (ROUTINE TESTING W REFLEX): HIV 1&2 Ab, 4th Generation: NONREACTIVE

## 2021-11-16 LAB — OBSTETRIC PANEL
Absolute Monocytes: 462 cells/uL (ref 200–950)
Antibody Screen: NOT DETECTED
Basophils Absolute: 23 cells/uL (ref 0–200)
Basophils Relative: 0.3 %
Eosinophils Absolute: 77 cells/uL (ref 15–500)
Eosinophils Relative: 1 %
HCT: 36.4 % (ref 35.0–45.0)
Hemoglobin: 11.9 g/dL (ref 11.7–15.5)
Hepatitis B Surface Ag: NONREACTIVE
Lymphs Abs: 1679 cells/uL (ref 850–3900)
MCH: 31.9 pg (ref 27.0–33.0)
MCHC: 32.7 g/dL (ref 32.0–36.0)
MCV: 97.6 fL (ref 80.0–100.0)
MPV: 10.9 fL (ref 7.5–12.5)
Monocytes Relative: 6 %
Neutro Abs: 5459 cells/uL (ref 1500–7800)
Neutrophils Relative %: 70.9 %
Platelets: 202 10*3/uL (ref 140–400)
RBC: 3.73 10*6/uL — ABNORMAL LOW (ref 3.80–5.10)
RDW: 12 % (ref 11.0–15.0)
RPR Ser Ql: NONREACTIVE
Rubella: 2.9 Index
Total Lymphocyte: 21.8 %
WBC: 7.7 10*3/uL (ref 3.8–10.8)

## 2021-11-16 LAB — HEPATITIS C ANTIBODY: Hepatitis C Ab: NONREACTIVE

## 2021-11-25 NOTE — Progress Notes (Unsigned)
   PRENATAL VISIT NOTE  Subjective:  Brittney Mays is a 36 y.o. G1P0000 at 70w1dbeing seen today for ongoing prenatal care.  She is currently monitored for the following issues for this low-risk pregnancy and has Anxiety state; Paresthesias; Myofascial pain; Family history of nonmelanoma skin cancer; Family history of genetic disease; Genetic testing; Mild scoliosis; Environmental allergies; Acid reflux; Infertility, female; AMA (advanced maternal age) multigravida 319+ and Encounter for supervision of normal first pregnancy in first trimester on their problem list.  Patient reports no complaints.  Contractions: Not present. Vag. Bleeding: None.   . Denies leaking of fluid.   The following portions of the patient's history were reviewed and updated as appropriate: allergies, current medications, past family history, past medical history, past social history, past surgical history and problem list.   Objective:   Vitals:   12/01/21 0930  BP: 126/76  Pulse: 93  Weight: 167 lb 1.3 oz (75.8 kg)    Fetal Status: Fetal Heart Rate (bpm): 152         General:  Alert, oriented and cooperative. Patient is in no acute distress.  Skin: Skin is warm and dry. No rash noted.   Cardiovascular: Normal heart rate noted  Respiratory: Normal respiratory effort, no problems with respiration noted  Abdomen: Soft, gravid, appropriate for gestational age.  Pain/Pressure: Absent     Pelvic: Cervical exam deferred        Extremities: Normal range of motion.     Mental Status: Normal mood and affect. Normal behavior. Normal judgment and thought content.   Assessment and Plan:  Pregnancy: G1P0000 at 177w1d. Antepartum multigravida of advanced maternal age LR invitae On ldasa  2. Encounter for supervision of normal first pregnancy in first trimester Offered and recommended msafp - pt would like to have it done.  Will schedule detailed anatomy with mfm.  Reviewed gbs bacteriuria and Ancef in labor.   They are undecided about circumcision. Discussed birth control - likely condoms. She plans to breastfeed. Reviewed birth preferences information and to try and formulate list by about 28 weeks, if possible.   Preterm labor symptoms and general obstetric precautions including but not limited to vaginal bleeding, contractions, leaking of fluid and fetal movement were reviewed in detail with the patient. Please refer to After Visit Summary for other counseling recommendations.   Return in about 4 weeks (around 12/29/2021) for OB VISIT, MD or APP.  No future appointments.   PaRadene GunningMD

## 2021-11-26 LAB — PANORAMA PRENATAL TEST FULL PANEL:PANORAMA TEST PLUS 5 ADDITIONAL MICRODELETIONS: FETAL FRACTION: 12.1

## 2021-11-28 LAB — HORIZON 4 (SMA, CF, FRAGILE X, DMD)
CYSTIC FIBROSIS: NEGATIVE
DUCHENNE/BECKER MUSCULAR DYSTROPHY: NEGATIVE
FRAGILE X SYNDROME: NEGATIVE
REPORT SUMMARY: NEGATIVE
SPINAL MUSCULAR ATROPHY: NEGATIVE

## 2021-12-01 ENCOUNTER — Encounter: Payer: Self-pay | Admitting: Obstetrics and Gynecology

## 2021-12-01 ENCOUNTER — Ambulatory Visit (INDEPENDENT_AMBULATORY_CARE_PROVIDER_SITE_OTHER): Payer: Commercial Managed Care - PPO | Admitting: Obstetrics and Gynecology

## 2021-12-01 VITALS — BP 126/76 | HR 93 | Wt 167.1 lb

## 2021-12-01 DIAGNOSIS — O09529 Supervision of elderly multigravida, unspecified trimester: Secondary | ICD-10-CM

## 2021-12-01 DIAGNOSIS — Z3401 Encounter for supervision of normal first pregnancy, first trimester: Secondary | ICD-10-CM

## 2021-12-01 NOTE — Patient Instructions (Signed)

## 2021-12-04 LAB — ALPHA FETOPROTEIN, MATERNAL
AFP MoM: 1.67
AFP, Serum: 53.5 ng/mL
Calc'd Gestational Age: 16.1 weeks
Maternal Wt: 167 [lb_av]
Risk for ONTD: 1
Twins-AFP: 1

## 2021-12-29 ENCOUNTER — Other Ambulatory Visit: Payer: Self-pay | Admitting: *Deleted

## 2021-12-29 ENCOUNTER — Encounter (INDEPENDENT_AMBULATORY_CARE_PROVIDER_SITE_OTHER): Payer: Self-pay

## 2021-12-29 ENCOUNTER — Encounter: Payer: Commercial Managed Care - PPO | Admitting: Obstetrics & Gynecology

## 2021-12-29 ENCOUNTER — Ambulatory Visit: Payer: Commercial Managed Care - PPO | Admitting: *Deleted

## 2021-12-29 ENCOUNTER — Encounter: Payer: Self-pay | Admitting: Family Medicine

## 2021-12-29 ENCOUNTER — Ambulatory Visit (INDEPENDENT_AMBULATORY_CARE_PROVIDER_SITE_OTHER): Payer: Commercial Managed Care - PPO | Admitting: Family Medicine

## 2021-12-29 ENCOUNTER — Encounter: Payer: Self-pay | Admitting: *Deleted

## 2021-12-29 ENCOUNTER — Ambulatory Visit: Payer: Commercial Managed Care - PPO | Attending: Obstetrics and Gynecology

## 2021-12-29 VITALS — BP 109/63 | HR 73 | Wt 174.4 lb

## 2021-12-29 VITALS — BP 113/61 | HR 83

## 2021-12-29 DIAGNOSIS — Z363 Encounter for antenatal screening for malformations: Secondary | ICD-10-CM | POA: Diagnosis not present

## 2021-12-29 DIAGNOSIS — O09512 Supervision of elderly primigravida, second trimester: Secondary | ICD-10-CM | POA: Insufficient documentation

## 2021-12-29 DIAGNOSIS — O09529 Supervision of elderly multigravida, unspecified trimester: Secondary | ICD-10-CM | POA: Diagnosis not present

## 2021-12-29 DIAGNOSIS — O4692 Antepartum hemorrhage, unspecified, second trimester: Secondary | ICD-10-CM | POA: Insufficient documentation

## 2021-12-29 DIAGNOSIS — O321XX0 Maternal care for breech presentation, not applicable or unspecified: Secondary | ICD-10-CM | POA: Insufficient documentation

## 2021-12-29 DIAGNOSIS — Z3A20 20 weeks gestation of pregnancy: Secondary | ICD-10-CM

## 2021-12-29 DIAGNOSIS — Z3401 Encounter for supervision of normal first pregnancy, first trimester: Secondary | ICD-10-CM | POA: Insufficient documentation

## 2021-12-29 DIAGNOSIS — O4402 Placenta previa specified as without hemorrhage, second trimester: Secondary | ICD-10-CM

## 2021-12-29 DIAGNOSIS — Z3689 Encounter for other specified antenatal screening: Secondary | ICD-10-CM | POA: Diagnosis not present

## 2021-12-29 DIAGNOSIS — O2342 Unspecified infection of urinary tract in pregnancy, second trimester: Secondary | ICD-10-CM

## 2021-12-29 DIAGNOSIS — Z362 Encounter for other antenatal screening follow-up: Secondary | ICD-10-CM

## 2021-12-29 DIAGNOSIS — O09522 Supervision of elderly multigravida, second trimester: Secondary | ICD-10-CM

## 2021-12-29 DIAGNOSIS — Z3402 Encounter for supervision of normal first pregnancy, second trimester: Secondary | ICD-10-CM

## 2021-12-29 NOTE — Progress Notes (Signed)
[facility]  PRENATAL VISIT NOTE  Subjective:  Brittney Mays is a 36 y.o. G1P0000 at 58w1dbeing seen today for ongoing prenatal care.  She is currently monitored for the following issues for this high-risk pregnancy and has Anxiety state; Paresthesias; Myofascial pain; Family history of nonmelanoma skin cancer; Family history of genetic disease; Genetic testing; Mild scoliosis; Environmental allergies; Acid reflux; Infertility, female; AMA (advanced maternal age) multigravida 316+ and Encounter for supervision of normal first pregnancy in first trimester on their problem list.  Patient reports fatigue.  Contractions: Not present. Vag. Bleeding: None.  Movement: Absent. Denies leaking of fluid.   The following portions of the patient's history were reviewed and updated as appropriate: allergies, current medications, past family history, past medical history, past social history, past surgical history and problem list.   Objective:   Vitals:   12/29/21 0933  BP: 109/63  Pulse: 73  Weight: 174 lb 6.4 oz (79.1 kg)    Fetal Status: Fetal Heart Rate (bpm): 150   Movement: Absent     General:  Alert, oriented and cooperative. Patient is in no acute distress.  Skin: Skin is warm and dry. No rash noted.   Cardiovascular: Normal heart rate noted  Respiratory: Normal respiratory effort, no problems with respiration noted  Abdomen: Soft, gravid, appropriate for gestational age.  Pain/Pressure: Absent     Pelvic: Cervical exam deferred        Extremities: Normal range of motion.  Edema: Trace  Mental Status: Normal mood and affect. Normal behavior. Normal judgment and thought content.   Assessment and Plan:  Pregnancy: G1P0000 at 235w1d1. Encounter for supervision of normal first pregnancy in first trimester  2. Multigravida of advanced maternal age in second trimester  3. UTI (urinary tract infection) during pregnancy, second trimester - Culture, OB Urine   Nursing Staff Provider  Office  Location  English Dating  LMP  [facility] Systemodel [x ] Traditional '[ ]'$  Centering '[ ]'$  Mom-Baby Dyad    Language  English Anatomy [place]20wk: Placenta previa, anterior intramural myoma, EFW 86%  Flu Vaccine   Genetic/Carrier Screen  NIPS:   NML female AFP:    [place]izon:  TDaP Vaccine    Hgb A1C or  GTT Early  Third trimester   COVID Vaccine    LAB RESULTS   Rhogam  N/A Blood Type A/RH(D) POSITIVE/-- (07/17 1148)   Baby Feeding Plan Breast Antibody NO ANTIBODIES DETECTED (07/17 1148)  Contraception Condoms Rubella 2.90 (07/17 1148)  Circumcision ? RPR NON-REACTIVE (07/17 1148)   Pediatrician   HBsAg NON-REACTIVE (07/17 1148)   Support Person RoHerbie BaltimoreCVAb NR  Prenatal Classes  HIV NON-REACTIVE (07/17 1148)     BTL Consent  GBS   (For PCN allergy, check sensitivities)   VBAC Consent  Pap        DME Rx '[ ]'$  BP cuff '[ ]'$  Weight Scale Waterbirth  '[ ]'$  Class '[ ]'$  Consent '[ ]'$  CNM visit  PHQ9 & GAD7 [ x ] new OB [  ] 28 weeks  [  ] 36 weeks Induction  '[ ]'$  Orders Entered '[ ]'$ Foley Y/N  On aspirin '81mg'$  for AMA. Has [place] in 4 weeks.   Preterm labor symptoms and general obstetric precautions including but not limited to vaginal bleeding, contractions, leaking of fluid and fetal movement were reviewed in detail with the patient. Please refer to After Visit Summary for other counseling recommendations.   Return in about 4 weeks (around 01/26/2022)  for ROB.  Future Appointments  Date Time Provider [place]  01/30/2022  8:15 AM [facility] NURSE [facility]  01/30/2022  8:30 AM [facility] US2 [facility]  01/30/2022 10:35 AM Constant, Vickii Chafe, MD [place] None    Shelda Pal, DO FMOB Fellow, Faculty practice [place] for [place] 12/29/21  12:19 PM

## 2021-12-29 NOTE — Progress Notes (Signed)
Pt presents for ROB visit. Pt transfer from [place]. Pt had anatomy scan today and has concerns about previa. Pt c/o of sleep apnea and tingling sensation in the abdomen.

## 2022-01-02 LAB — CULTURE, OB URINE

## 2022-01-02 LAB — URINE CULTURE, OB REFLEX

## 2022-01-07 ENCOUNTER — Other Ambulatory Visit: Payer: Self-pay | Admitting: Family Medicine

## 2022-01-07 DIAGNOSIS — R8271 Bacteriuria: Secondary | ICD-10-CM

## 2022-01-07 MED ORDER — NITROFURANTOIN MONOHYD MACRO 100 MG PO CAPS: 100.0000 mg | ORAL_CAPSULE | Freq: Two times a day (BID) | ORAL | 0 refills | Status: AC

## 2022-01-30 ENCOUNTER — Encounter: Payer: Self-pay | Admitting: Obstetrics and Gynecology

## 2022-01-30 ENCOUNTER — Encounter: Payer: Self-pay | Admitting: Advanced Practice Midwife

## 2022-01-30 ENCOUNTER — Ambulatory Visit: Payer: Commercial Managed Care - PPO | Admitting: *Deleted

## 2022-01-30 ENCOUNTER — Other Ambulatory Visit: Payer: Self-pay | Admitting: *Deleted

## 2022-01-30 ENCOUNTER — Ambulatory Visit (INDEPENDENT_AMBULATORY_CARE_PROVIDER_SITE_OTHER): Payer: Commercial Managed Care - PPO | Admitting: Obstetrics and Gynecology

## 2022-01-30 ENCOUNTER — Ambulatory Visit: Payer: Commercial Managed Care - PPO | Attending: Obstetrics and Gynecology

## 2022-01-30 VITALS — BP 113/72 | HR 75 | Wt 182.0 lb

## 2022-01-30 VITALS — BP 131/63 | HR 96

## 2022-01-30 DIAGNOSIS — Z3401 Encounter for supervision of normal first pregnancy, first trimester: Secondary | ICD-10-CM

## 2022-01-30 DIAGNOSIS — O09522 Supervision of elderly multigravida, second trimester: Secondary | ICD-10-CM

## 2022-01-30 DIAGNOSIS — O09512 Supervision of elderly primigravida, second trimester: Secondary | ICD-10-CM | POA: Insufficient documentation

## 2022-01-30 DIAGNOSIS — N979 Female infertility, unspecified: Secondary | ICD-10-CM

## 2022-01-30 DIAGNOSIS — Z362 Encounter for other antenatal screening follow-up: Secondary | ICD-10-CM | POA: Insufficient documentation

## 2022-01-30 DIAGNOSIS — O3410 Maternal care for benign tumor of corpus uteri, unspecified trimester: Secondary | ICD-10-CM | POA: Diagnosis not present

## 2022-01-30 DIAGNOSIS — D259 Leiomyoma of uterus, unspecified: Secondary | ICD-10-CM | POA: Diagnosis not present

## 2022-01-30 DIAGNOSIS — Z3A24 24 weeks gestation of pregnancy: Secondary | ICD-10-CM

## 2022-01-30 DIAGNOSIS — O4400 Placenta previa specified as without hemorrhage, unspecified trimester: Secondary | ICD-10-CM | POA: Insufficient documentation

## 2022-01-30 DIAGNOSIS — O4402 Placenta previa specified as without hemorrhage, second trimester: Secondary | ICD-10-CM

## 2022-01-30 DIAGNOSIS — Z3689 Encounter for other specified antenatal screening: Secondary | ICD-10-CM

## 2022-01-30 MED ORDER — COMFORT FIT MATERNITY SUPP MED MISC: 0 refills | Status: DC

## 2022-01-30 NOTE — Progress Notes (Signed)
ROB, c/o wants to dicuss Placenta Previa, weight gain, RSV vaccine, intermittent lower right quadrant back pain 4/10, however she is stiff on both sides.

## 2022-01-30 NOTE — Progress Notes (Signed)
   PRENATAL VISIT NOTE  Subjective:  Brittney Mays is a 36 y.o. G1P0000 at 68w5dbeing seen today for ongoing prenatal care.  She is currently monitored for the following issues for this high-risk pregnancy and has Anxiety state; Paresthesias; Myofascial pain; Family history of nonmelanoma skin cancer; Family history of genetic disease; Genetic testing; Mild scoliosis; Environmental allergies; Acid reflux; Infertility, female; AMA (advanced maternal age) multigravida 354+ Encounter for supervision of normal first pregnancy in first trimester; and Placenta previa antepartum on their problem list.  Patient reports backache.  Contractions: Not present. Vag. Bleeding: None.  Movement: Present. Denies leaking of fluid.   The following portions of the patient's history were reviewed and updated as appropriate: allergies, current medications, past family history, past medical history, past social history, past surgical history and problem list.   Objective:   Vitals:   01/30/22 1039  BP: 113/72  Pulse: 75  Weight: 182 lb (82.6 kg)    Fetal Status: Fetal Heart Rate (bpm): 148 Fundal Height: 26 cm Movement: Present     General:  Alert, oriented and cooperative. Patient is in no acute distress.  Skin: Skin is warm and dry. No rash noted.   Cardiovascular: Normal heart rate noted  Respiratory: Normal respiratory effort, no problems with respiration noted  Abdomen: Soft, gravid, appropriate for gestational age.  Pain/Pressure: Absent     Pelvic: Cervical exam deferred        Extremities: Normal range of motion.  Edema: Trace  Mental Status: Normal mood and affect. Normal behavior. Normal judgment and thought content.   Assessment and Plan:  Pregnancy: G1P0000 at 257w5d. Encounter for supervision of normal first pregnancy in first trimester Patient is doing well without complaints Third trimester labs and glucola next visit Pediatrician list provided Discussed excessive weight gain thus  far in pregnancy. Advised to ensure she is consuming a nutritious diet rather than empty calories and to increase level of activity Rx maternity support belt provided to assist with back aches  2. Multigravida of advanced maternal age in second trimester Continue ASA   3. Placenta previa antepartum Patient had ultrasound this morning and report not yet available Patient reports placenta previa still present- advised to avoid intercourse Precautions reviewed  Preterm labor symptoms and general obstetric precautions including but not limited to vaginal bleeding, contractions, leaking of fluid and fetal movement were reviewed in detail with the patient. Please refer to After Visit Summary for other counseling recommendations.   Return in about 4 weeks (around 02/27/2022) for in person, ROB, High risk, 2 hr glucola next visit.  Future Appointments  Date Time Provider [place]10/31/2023  7:45 AM [facility] NURSE [facility]/20[facility]  8:00 [facility]  8:15 AM [facility]  8:30 AM [facility] [place] [facility] [place]  PeMora BellmanMD

## 2022-01-30 NOTE — Patient Instructions (Signed)
AREA PEDIATRIC/FAMILY PRACTICE PHYSICIANS  Central/[place]outheast [place] ([address]) [facility] Chambliss, MD; Eniola, MD; Hale, MD; Hensel, MD; McDiarmid, MD; McIntyer, MD; Neal, MD; Walden, MD [address] [place], [place] [address] [phone] Mon-Fri 8:30-12:30, 1:30-5:00 Providers come to see babies at [facility] Accepting Medicaid [facility] providers who accept newborns: Koirala, MD; Morrow, MD; Wolters, MD [address], [place], [place] [address] [phone] Mon-Fri 8:00-5:30 Babies seen by providers at [facility] Does NOT accept Medicaid Please call early in hospitalization for appointment (limited availability)  [facility] Mulberry, MD [address] [place], [place] [address] [phone] Mon, Tue, Thur, Fri 8:30-5:00, Wed 10:00-7:00 (closed 1-2pm) Babies seen by [facility] providers Accepting Medicaid Rubin - Pediatrician Rubin, MD [address], [place], [place] [address] [phone] Mon-Fri 8:30-5:00, Sat 8:30-12:00 Provider comes to see babies at [facility] Accepting Medicaid Must have been referred from current patients or contacted office prior to delivery [facility] ([facility]) Brown, MD; Chandler, MD; Ettefagh, MD; Grant, MD; Lester, MD; McCormick, MD; McQueen, MD; Prose, MD; Simha, MD; Stanley, MD; Stryffeler, NP; Tebben, NP [address], [place], [place] [address] [phone] Mon, Tue, Thur, Fri 8:30-5:30, Wed 9:30-5:30, Sat 8:30-12:30 Babies seen by [facility] providers Accepting Medicaid Only accepting infants of first-time parents or siblings of current patients Hospital discharge coordinator will make follow-up appointment Jack Amos [address], [place], [place]  [address] [phone]   Fax - [phone] [facility] [address], [place], [place]  [address] Phone - [phone]   Fax - [phone] Shilpa Gosrani [address], [place], [place]  [address] [phone]  [place]ortheast [place] ([address]) [facility] Bates, MD; Brassfield, MD; Cooper, Cox, MD; MD; Davis, MD; Dovico, MD; Ettefaugh, MD; Little, MD; Lowe, MD; Keiffer, MD; Melvin, MD; Sumner, MD; Williams, MD [address], [place], [place] [address] [phone] Mon-Fri 8:30-5:00 (extended evenings Mon-Thur as needed), Sat-Sun 10:00-1:00 Providers come to see babies at [facility] Accepting Medicaid for families of first-time babies and families with all children in the household age 3 and under. Must register with office prior to making appointment (M-F only). [facility] Henson, NP; Knapp, MD; Lalonde, MD; Tysinger, PA [address] [place], [place] [address] [phone] Mon-Fri 8:00-5:00 Babies seen by providers at [facility] Does NOT accept Medicaid/Commercial Insurance Only [facility] ([facility])  Artis, MD; Barnes, MD; Bratton, MD; Coccaro, MD; Lockett Gardner, MD; Kramer, MD; Marshall, MD; Netherton, MD; Poleto, MD; Skinner, MD [address] [place], [place] [address] [phone] Mon-Fri 8:30-5:30, Sat (Oct.-Mar.) 9:00-1:00 Babies seen by providers at [facility] Accepting Medicaid  [place] ([address]) [facility] Reid, MD; Warner, MD [address], [place], [place] [address] [phone] Mon-Fri 8:30-5:00, Sat 8:30-12:00 Providers come to see babies at [facility] Does NOT accept Medicaid [facility] Becker, PA; Hagler, MD; Scifres, PA; Sun, MD; Swayne, MD [address], [place], [place] [address] [phone] Mon-Fri 8:00-5:00 Babies seen by providers at [facility] Does NOT accept Medicaid Only accepting babies of parents who  are patients Please call early in hospitalization for appointment (limited availability) [facility] Clark, MD; Frye, MD; Kelleher, MD; Mack, NP; Miller, MD; O'Keller, MD; Patterson, NP; Pudlo, MD; Puzio, MD; Thomas, MD; Tucker, MD; Twiselton, MD [address], [place], [place] [address] [phone] Mon-Fri 8:00-5:00, Sat 9:00-12:00 Providers come to see babies at [facility] Does NOT accept Medicaid  [place] ([address]) [facility] providers accepting new patients: Brake, NP; Wharton, PA [address], [place], [place] [address] [phone] Mon-Fri 8:00-5:00 Babies seen by providers at [facility] Does NOT accept Medicaid Only accepting babies of parents who are patients Please call early in hospitalization for appointment (limited availability) [facility] Gay, MD; Quinlan, MD [address] [place], [place] [address] [phone] (press 1 to schedule appointment) Mon-Fri 8:00-5:00 Providers come to see babies at [facility] Does NOT accept Medicaid [facility] Mazer, MD [address] [place], [place] [address] [phone] Mon-Fri 8:30-5:00 (lunch 12:30-1:00), extended hours by appointment only Wed 5:00-6:30 Babies seen by [facility] providers Accepting Medicaid [facility] Banks, MD; Jordan, MD; Koberlein, MD [address], [place], [place] [address] [phone] Mon-Fri 8:00-5:00 Babies seen by [facility] providers Does NOT accept Medicaid [facility] Parker, MD; Hunter, MD; Wallace, DO [address] [place], [place] [address] [phone] Mon-Fri 8:00-5:00 Babies seen by [facility] providers Does NOT accept Medicaid [facility] Brandon, PA; Brecken, PA; Christy, NP; Dees, MD; DeClaire, MD; DeWeese, MD; Hansen, NP; Mills, NP; Parrish, NP; Smoot, NP; Summer, MD; Vapne,  MD [address] [place], [place] [address] [phone] Mon-Fri 8:30-5:00, Sat 10:00-1:00 Providers come to see babies at [facility] Does NOT accept Medicaid Free prenatal information session Tuesdays at 4:45pm [facility] Bouska, MD; Gordon, PA; Jeffery, PA; Weber, PA [address] [place] [address] [phone] Mon-Fri 7:30-5:30 Babies seen by [facility] providers [facility] [address], [place], [place]  [address] [phone]   Fax - [phone]  [place] ([address] & [address]) [facility] [place] [address] [place], [place] [address] [phone] Mon-Thur 8:00-6:00 Providers come to see babies at [facility] Accepting Medicaid [facility] Anderson, NP; Badger, MD; Beal, PA; Spencer, PA [address] [place], [place] [address] [phone] Mon-Thur 7:30-7:30, Fri 7:30-4:30 Babies seen by [facility] providers Accepting Medicaid [facility] Agbuya, MD; Klett, NP; Romgoolam, MD [address], [place], [place] [address] [phone] Mon-Fri 8:30-5:00, Sat 8:30-12:00 Providers come to see babies at [facility] Accepting Medicaid Must have "Meet & Greet" appointment at office prior to delivery [facility] McCord, MD; Wallace, MD; [place], MD [address], [place], [place] [address] [phone] Mon-Wed 8:00-6:00, Thur-Fri 8:00-5:00, Sat 9:00-12:00 Providers come to see babies at [facility] Does NOT accept Medicaid Only accepting siblings of current patients [facility]  [address], [place], [place]  [address] [phone]   Fax - [phone] [facility] [address], [place], [place]  [address] [phone]   Fax -  [phone]  [place] ([address] & [address]) [facility] Cirigliano, DO; Matthews, DO [address] [place], [place] [address] [phone] Mon-Fri 7:00-5:00 Babies seen by [facility] providers Does NOT accept Medicaid [facility] Briscoe, MD; Howley, PA; Moreira, PA [address], [place], [place] [address] [phone] Mon-Fri 8:00-5:00 Babies seen by [facility] providers Accepting Medicaid [facility] Boyd, MD; Church, PA; Jones, NP; Osborn, PA [address], , [place] [address] [phone] Mon-Fri 8:00-5:00 Babies seen by providers at [facility] Accepting Medicaid  [place] ([address]) [facility] [place] Wendling, DO [address] [place], [place] [address] [phone] Mon-Fri 8:00-5:00 Babies seen by [facility] providers Does NOT accept Medicaid Limited availability, please call early in hospitalization to schedule follow-up [facility] Calderon, PA; Cummings, MD; Dillard, MD; Martin, PA; Olson, MD; VanDeven, PA [address], [place], [place] [address] [phone] Mon-Fri 8:30-5:00, Sat 9:00-12:00 Babies seen by providers at [facility] Accepting Medicaid Please register online then schedule online or call office [email or web address] Hunter, NP; Kumar, MD; Martin Rogers, PA [address], [place], [place] [address] [phone] Mon-Fri 8:00-5:00 Babies seen by providers at [facility] Accepting Medicaid [facility] ([facility]) [place], MD; Kristi Fleenor, NP; [place], MD [address], [place], [place] [address] [phone] Mon-Fri 8:00-5:30, Sat&Sun by appointment (phones open at  8:30) Babies seen by [facility] providers Accepting Medicaid Must be a first-time baby or sibling of current patient [facility] [place]  [address], [place], [place]  [address] [phone]   Fax - [phone]  [place] ([address]) [facility] Brown, PA; Cowen, PA; Rice, MD; Helton, PA; Spry, MD [address] [place], [place] [address] [phone] Mon-Thur 8:00-7:00, Fri 8:00-5:00, Sat 8:00-12:00, Sun 9:00-12:00 Babies seen by [facility] providers Accepting Medicaid [facility] Coe-Goins, MD; Marshall, MD; Pierre-Louis, MD [address], [place], [place] [address] [phone] Mon-Thur 8:00-5:00 Babies seen by providers at [facility] Accepting Medicaid [facility] Bratton, MD; Coe-Goins, MD; Hayes, MD; Lewis, MD; List, MD; Lott, MD; Marshall, MD; Moran, MD; O'Neal, MD; Pierre-Louis, MD; Pitonzo, MD; Scholer, MD; Spangle, MD [address] [place], [place] [address] [phone] Mon-Fri 8:00-5:30, Sat (Oct.-Mar.) 9:00-1:00 Babies seen by providers at [facility] Accepting Medicaid Must fill out new patient packet, available online at [email or web address] [facility] ([facility]) Friddle, NP; Harris, NP; Kelly, NP; Logan, MD; Melvin, PA; Poth, MD; Ramadoss, MD; Stanton, NP [address], [place], [place] [address] [phone] Mon-Thur 8:00-5:30, Fri 8:00-5:00 Babies seen by providers at [facility] Accepting Medicaid  [place] ([address]) [facility] Dixon, PA; [place], MD; Pickard, MD; Tapia, PA [address], [place], [place] [address] [phone] Mon-Fri 8:00-5:00 Babies seen by providers at [facility] Accepting Medicaid   [place] ([address]) [facility] [place] Masneri, DO; Meyers, MD; Nelson, PA [address], [place], [place] [address] [phone] Mon-Fri 8:00-5:00 Babies seen by providers at [facility] Does NOT accept Medicaid Limited appointment availability, please call early in hospitalization  [facility] [place] Kunedd, DO; McGowen, MD [address], [place], [place] [address] [phone] Mon-Fri 8:00-5:00 Babies seen by [facility] providers Does NOT accept Medicaid [facility] [place] Cameron, MD; MacDonald, MD; Michaels, PA; Nayak, MD [address] [place] [address], [place],  [address] [phone] Mon-Fri 8:00-5:00 After hours clinic ([address] [place],  [address]) [phone] Mon-Fri 5:00-8:00, Sat 12:00-6:00, Sun 10:00-4:00 Babies seen by [facility] providers Accepting Medicaid [facility] [place] [address], [place], [place]  [address] [phone]   Fax - [phone]  [place] ([address]) [facility] [place] [facility] Andy, MD [address], [place], [place] [address] [phone] Mon-Fri 8:00-5:00 Babies seen by [facility] providers Does NOT accept Medicaid [facility] [place] ([facility] [place]) Eksir, MD [address], [place], [place] [address] [phone] Mon-Thur 8:00-7:00, Fri 8:00-5:00, Sat 8:00-12:00 Babies seen by providers at [facility] Accepting Medicaid - but does not have vaccinations in office (must be received elsewhere) Limited availability, please call early in hospitalization  [place] ([address]) [facility]  Charlene Flemming, MD [address],  [place] [address] [phone]  Fax [phone]  [facility]  Kimberly [place], MD, Annamarie Streilein, PA, Carla Hampton, PA [address] [place], [place]  [address] [phone] [facility]  [address], [place], [place] [address] [phone] [address], [place], [place] [address] [phone] (West Office)  [facility] [address], [place], [place] [address] [phone] [facility] [address], [place], [place] [address] [phone] [facility] [address], [place], [place] [address] [phone] [facility] [address], [place], [place] [address] [phone] [facility] [address], [place], [place] [address] [phone] [facility] [address], [place], [place] [address] [phone] [facility]  [address], [place], [place] [address] [phone] Dr[facility] [address] [place] [address], [place], [place] [address] [phone] [facility] [address], [address], [place],  [address] [phone] [facility] [address], ,  [address] [phone]  

## 2022-02-01 LAB — URINE CULTURE

## 2022-02-02 ENCOUNTER — Encounter: Payer: Self-pay | Admitting: Obstetrics and Gynecology

## 2022-02-02 DIAGNOSIS — R8271 Bacteriuria: Secondary | ICD-10-CM | POA: Insufficient documentation

## 2022-02-27 ENCOUNTER — Other Ambulatory Visit: Payer: Commercial Managed Care - PPO

## 2022-02-27 ENCOUNTER — Ambulatory Visit (INDEPENDENT_AMBULATORY_CARE_PROVIDER_SITE_OTHER): Payer: Commercial Managed Care - PPO | Admitting: Advanced Practice Midwife

## 2022-02-27 VITALS — BP 119/69 | HR 85 | Wt 182.0 lb

## 2022-02-27 DIAGNOSIS — O99891 Other specified diseases and conditions complicating pregnancy: Secondary | ICD-10-CM

## 2022-02-27 DIAGNOSIS — O09522 Supervision of elderly multigravida, second trimester: Secondary | ICD-10-CM

## 2022-02-27 DIAGNOSIS — Z23 Encounter for immunization: Secondary | ICD-10-CM

## 2022-02-27 DIAGNOSIS — R202 Paresthesia of skin: Secondary | ICD-10-CM

## 2022-02-27 DIAGNOSIS — M549 Dorsalgia, unspecified: Secondary | ICD-10-CM

## 2022-02-27 DIAGNOSIS — O4400 Placenta previa specified as without hemorrhage, unspecified trimester: Secondary | ICD-10-CM

## 2022-02-27 DIAGNOSIS — Z3403 Encounter for supervision of normal first pregnancy, third trimester: Secondary | ICD-10-CM

## 2022-02-27 DIAGNOSIS — O09523 Supervision of elderly multigravida, third trimester: Secondary | ICD-10-CM

## 2022-02-27 DIAGNOSIS — O4403 Placenta previa specified as without hemorrhage, third trimester: Secondary | ICD-10-CM

## 2022-02-27 DIAGNOSIS — Z3401 Encounter for supervision of normal first pregnancy, first trimester: Secondary | ICD-10-CM

## 2022-02-27 DIAGNOSIS — R8271 Bacteriuria: Secondary | ICD-10-CM

## 2022-02-27 DIAGNOSIS — Z3A28 28 weeks gestation of pregnancy: Secondary | ICD-10-CM

## 2022-02-27 DIAGNOSIS — O26843 Uterine size-date discrepancy, third trimester: Secondary | ICD-10-CM

## 2022-02-27 MED ORDER — CEFADROXIL 500 MG PO CAPS: 500.0000 mg | ORAL_CAPSULE | Freq: Two times a day (BID) | ORAL | 0 refills | Status: AC

## 2022-02-27 NOTE — Progress Notes (Signed)
TDap given in RD, tolerated well.

## 2022-02-27 NOTE — Progress Notes (Signed)
PRENATAL VISIT NOTE  Subjective:  Brittney Mays is a 36 y.o. G1P0000 at 79w5dbeing seen today for ongoing prenatal care.  She is currently monitored for the following issues for this low-risk pregnancy and has Anxiety state; Paresthesias; Myofascial pain; Family history of nonmelanoma skin cancer; Family history of genetic disease; Genetic testing; Mild scoliosis; Environmental allergies; Acid reflux; Infertility, female; AMA (advanced maternal age) multigravida 353+ Encounter for supervision of normal first pregnancy in first trimester; Placenta previa antepartum; and GBS bacteriuria on their problem list.  Patient reports  some parasthesias, back pain  .  Contractions: Not present. Vag. Bleeding: None.  Movement: Present. Denies leaking of fluid.   The following portions of the patient's history were reviewed and updated as appropriate: allergies, current medications, past family history, past medical history, past social history, past surgical history and problem list.   Objective:   Vitals:   02/27/22 0936  BP: 119/69  Pulse: 85  Weight: 182 lb (82.6 kg)    Fetal Status: Fetal Heart Rate (bpm): 150                     150           Fundal Height: 30 cm Movement: Present     General:  Alert, oriented and cooperative. Patient is in no acute distress.  Skin: Skin is warm and dry. No rash noted.   Cardiovascular: Normal heart rate noted  Respiratory: Normal respiratory effort, no problems with respiration noted  Abdomen: Soft, gravid, appropriate for gestational age.  Pain/Pressure: Present     Pelvic: Cervical exam deferred        Extremities: Normal range of motion.  Edema: Trace  Mental Status: Normal mood and affect. Normal behavior. Normal judgment and thought content.   Assessment and Plan:  Pregnancy: G1P0000 at 293w5d. Encounter for supervision of normal first pregnancy in first trimester --Anticipatory guidance about next visits/weeks of pregnancy given.  2.  Placenta previa antepartum --USKorean 10/31 to reevaluate  3. Multigravida of advanced maternal age in second trimester   4. GBS bacteriuria --TOC at previous visit with 10,000-25,000 colonies of GBS, down from greater than 100,000 colonies prior to treatment. --Will treat with Duricef BID x 7 days and retest at next visit --Prophylaxis in labor  - cefadroxil (DURICEF) 500 MG capsule; Take 1 capsule (500 mg total) by mouth 2 (two) times daily for 7 days.  Dispense: 14 capsule; Refill: 0  5. [redacted] weeks gestation of pregnancy  - Tdap vaccine greater than or equal to 7yo IM - CBC - Glucose Tolerance, 2 Hours w/1 Hour - HIV Antibody (routine testing w rflx) - RPR  6. Uterine size date discrepancy pregnancy, third trimester --EFW 80% on previous US[place]oreaFH 2 cm ahead today, USKorean 10/31 to follow up.    7. Back pain complicating pregnancy in third trimester --Rest/ice/heat/warm bath/increase PO fluids/Tylenol/pregnancy support belt   8. Paresthesias --Some on abdomen, some in hands and feet, intermittent --Previous eval by neuro including MRI --Pregnancy may be causing MSK changes, possible intermittent impingement of nerves.  Discussed stretches, posture, use of pregnancy support belt. Offered PT, pt will think about and consider.   Preterm labor symptoms and general obstetric precautions including but not limited to vaginal bleeding, contractions, leaking of fluid and fetal movement were reviewed in detail with the patient. Please refer to After Visit Summary for other counseling recommendations.   Return in about 2 weeks (around 03/13/2022) for  Any provider.  Future Appointments  Date Time Provider [place]  02/28/2022  7:45 AM [facility] NURSE [facility]  02/28/2022  8:00 AM [facility] US1 [facility]  03/16/2022  4:10 PM Laury Deep, CNM [facility] None  03/27/2022  8:15 AM [facility] NURSE [facility]  03/27/2022  8:30 AM [facility] US2 [facility]    Brittney Mays, CNM

## 2022-02-28 ENCOUNTER — Ambulatory Visit: Payer: Commercial Managed Care - PPO | Attending: Maternal & Fetal Medicine

## 2022-02-28 ENCOUNTER — Ambulatory Visit: Payer: Commercial Managed Care - PPO | Admitting: *Deleted

## 2022-02-28 VITALS — BP 116/57 | HR 81

## 2022-02-28 DIAGNOSIS — O09512 Supervision of elderly primigravida, second trimester: Secondary | ICD-10-CM | POA: Insufficient documentation

## 2022-02-28 DIAGNOSIS — O3413 Maternal care for benign tumor of corpus uteri, third trimester: Secondary | ICD-10-CM | POA: Diagnosis not present

## 2022-02-28 DIAGNOSIS — O09513 Supervision of elderly primigravida, third trimester: Secondary | ICD-10-CM | POA: Diagnosis not present

## 2022-02-28 DIAGNOSIS — O4400 Placenta previa specified as without hemorrhage, unspecified trimester: Secondary | ICD-10-CM | POA: Diagnosis present

## 2022-02-28 DIAGNOSIS — Z3A28 28 weeks gestation of pregnancy: Secondary | ICD-10-CM

## 2022-02-28 DIAGNOSIS — Z362 Encounter for other antenatal screening follow-up: Secondary | ICD-10-CM

## 2022-02-28 DIAGNOSIS — O4403 Placenta previa specified as without hemorrhage, third trimester: Secondary | ICD-10-CM | POA: Diagnosis not present

## 2022-02-28 DIAGNOSIS — O4402 Placenta previa specified as without hemorrhage, second trimester: Secondary | ICD-10-CM | POA: Insufficient documentation

## 2022-02-28 DIAGNOSIS — Z3689 Encounter for other specified antenatal screening: Secondary | ICD-10-CM | POA: Diagnosis present

## 2022-02-28 DIAGNOSIS — R8271 Bacteriuria: Secondary | ICD-10-CM

## 2022-02-28 DIAGNOSIS — D259 Leiomyoma of uterus, unspecified: Secondary | ICD-10-CM

## 2022-02-28 LAB — HIV ANTIBODY (ROUTINE TESTING W REFLEX): HIV Screen 4th Generation wRfx: NONREACTIVE

## 2022-02-28 LAB — CBC
Hematocrit: 33.8 % — ABNORMAL LOW (ref 34.0–46.6)
Hemoglobin: 11.1 g/dL (ref 11.1–15.9)
MCH: 32.6 pg (ref 26.6–33.0)
MCHC: 32.8 g/dL (ref 31.5–35.7)
MCV: 99 fL — ABNORMAL HIGH (ref 79–97)
Platelets: 164 10*3/uL (ref 150–450)
RBC: 3.41 x10E6/uL — ABNORMAL LOW (ref 3.77–5.28)
RDW: 12.1 % (ref 11.7–15.4)
WBC: 7.4 10*3/uL (ref 3.4–10.8)

## 2022-02-28 LAB — RPR: RPR Ser Ql: NONREACTIVE

## 2022-02-28 LAB — GLUCOSE TOLERANCE, 2 HOURS W/ 1HR
Glucose, 1 hour: 168 mg/dL (ref 70–179)
Glucose, 2 hour: 117 mg/dL (ref 70–152)
Glucose, Fasting: 82 mg/dL (ref 70–91)

## 2022-03-06 ENCOUNTER — Institutional Professional Consult (permissible substitution): Payer: Self-pay | Admitting: Licensed Clinical Social Worker

## 2022-03-16 ENCOUNTER — Encounter: Payer: Self-pay | Admitting: Obstetrics and Gynecology

## 2022-03-16 ENCOUNTER — Institutional Professional Consult (permissible substitution): Payer: Self-pay | Admitting: Licensed Clinical Social Worker

## 2022-03-16 ENCOUNTER — Ambulatory Visit (INDEPENDENT_AMBULATORY_CARE_PROVIDER_SITE_OTHER): Payer: Commercial Managed Care - PPO | Admitting: Obstetrics and Gynecology

## 2022-03-16 VITALS — BP 128/72 | HR 87 | Wt 185.2 lb

## 2022-03-16 DIAGNOSIS — R8271 Bacteriuria: Secondary | ICD-10-CM

## 2022-03-16 DIAGNOSIS — Z3401 Encounter for supervision of normal first pregnancy, first trimester: Secondary | ICD-10-CM

## 2022-03-16 DIAGNOSIS — Z3A31 31 weeks gestation of pregnancy: Secondary | ICD-10-CM

## 2022-03-16 DIAGNOSIS — O4400 Placenta previa specified as without hemorrhage, unspecified trimester: Secondary | ICD-10-CM

## 2022-03-16 DIAGNOSIS — O4403 Placenta previa specified as without hemorrhage, third trimester: Secondary | ICD-10-CM

## 2022-03-16 NOTE — Progress Notes (Signed)
Patient presents for ROB. Patient wants to make sure UTI is resolved. Patient states that she still has to urinate frequently, but is unsure if it is pregnancy related or UTI related.

## 2022-03-18 LAB — URINE CULTURE, OB REFLEX: Organism ID, Bacteria: NO GROWTH

## 2022-03-18 LAB — CULTURE, OB URINE

## 2022-03-19 ENCOUNTER — Encounter: Payer: Self-pay | Admitting: Obstetrics and Gynecology

## 2022-03-19 NOTE — Progress Notes (Signed)
   LOW-RISK PREGNANCY OFFICE VISIT Patient name: Brittney Mays MRN [id number]  Date of birth: 05-09-1985 Chief Complaint:   Routine Prenatal Visit  History of Present Illness:   Brittney Mays is a 36 y.o. G2P0000 female at 78w1dwith an Estimated Date of Delivery: 05/17/22 being seen today for ongoing management of a low-risk pregnancy.  Today she reports increased urinary frequency. She is worried her UTI is still on-going.  wanting to know if she was still going to have a cesarean delivery at 37 wks, because her mom is due to be out of the country when she turns 37 weeks. She wants to know if her surgery could be done 12/29 or later instead on exactly 12/27 . Would like to get the RSV vaccine, but needs note given the pharmacy permission to administer the vaccine to her from 32-36 wks.  Contractions: Not present. Vag. Bleeding: None.  Movement: Present. denies leaking of fluid. Review of Systems:   Pertinent items are noted in HPI Denies abnormal vaginal discharge w/ itching/odor/irritation, headaches, visual changes, shortness of breath, chest pain, abdominal pain, severe nausea/vomiting, or problems with urination or bowel movements unless otherwise stated above. Pertinent History Reviewed:  Reviewed past medical,surgical, social, obstetrical and family history.  Reviewed problem list, medications and allergies. Physical Assessment:   Vitals:   03/16/22 1615  BP: 128/72  Pulse: 87  Weight: 185 lb 3.2 oz (84 kg)  Body mass index is 29.01 kg/m.        Physical Examination:   General appearance: Well appearing, and in no distress  Mental status: Alert, oriented to person, place, and time  Skin: Warm & dry  Cardiovascular: Normal heart rate noted  Respiratory: Normal respiratory effort, no distress  Abdomen: Soft, gravid, nontender  Pelvic: Cervical exam deferred         Extremities: Edema: Trace  Fetal Status: Fetal Heart Rate (bpm): 141 Fundal Height: 33 cm Movement:  Present    No results found for this or any previous visit (from the past 24 hour(s)).  Assessment & Plan:  1) Low-risk pregnancy G1P0000 at 328w4dith an Estimated Date of Delivery: 05/17/22   2) Encounter for supervision of normal first pregnancy in first trimester  - Note to receive RSV vaccine after 32 wks pregnancy given  3) GBS bacteriuria  - TOC>>Culture, OB Urine  4) Placenta previa antepartum  - Persistent Posterior Placenta Previa - Repeat U/S at 32 wks to assess - Will consult with OB to see if scheduling C?S can be done some time in the week of 37. Will notify of answer via MyChart.  Meds: No orders of the defined types were placed in this encounter.  Labs/procedures today: UCx  Plan:  Continue routine obstetrical care non  Reviewed: Preterm labor symptoms and general obstetric precautions including but not limited to vaginal bleeding, contractions, leaking of fluid and fetal movement were reviewed in detail with the patient.  All questions were answered. Has home bp cuff. Check bp weekly, let usKoreanow if >140/90.   Follow-up: Return in about 2 weeks (around 03/30/2022) for Return OB visit.  Orders Placed This Encounter  Procedures   Culture, OB Urine   Urine Culture, OB Reflex   RoLaury DeepSN, CNM 03/16/2022 4:30 PM

## 2022-03-21 ENCOUNTER — Encounter: Payer: Self-pay | Admitting: Obstetrics and Gynecology

## 2022-03-27 ENCOUNTER — Ambulatory Visit: Payer: Commercial Managed Care - PPO | Admitting: *Deleted

## 2022-03-27 ENCOUNTER — Ambulatory Visit: Payer: Commercial Managed Care - PPO | Attending: Maternal & Fetal Medicine

## 2022-03-27 ENCOUNTER — Other Ambulatory Visit: Payer: Self-pay | Admitting: *Deleted

## 2022-03-27 VITALS — BP 116/61 | HR 84

## 2022-03-27 DIAGNOSIS — O3413 Maternal care for benign tumor of corpus uteri, third trimester: Secondary | ICD-10-CM

## 2022-03-27 DIAGNOSIS — O4400 Placenta previa specified as without hemorrhage, unspecified trimester: Secondary | ICD-10-CM | POA: Diagnosis present

## 2022-03-27 DIAGNOSIS — O09513 Supervision of elderly primigravida, third trimester: Secondary | ICD-10-CM | POA: Diagnosis not present

## 2022-03-27 DIAGNOSIS — Z3689 Encounter for other specified antenatal screening: Secondary | ICD-10-CM | POA: Insufficient documentation

## 2022-03-27 DIAGNOSIS — D259 Leiomyoma of uterus, unspecified: Secondary | ICD-10-CM

## 2022-03-27 DIAGNOSIS — O4403 Placenta previa specified as without hemorrhage, third trimester: Secondary | ICD-10-CM | POA: Diagnosis not present

## 2022-03-27 DIAGNOSIS — O4440 Low lying placenta NOS or without hemorrhage, unspecified trimester: Secondary | ICD-10-CM

## 2022-03-27 DIAGNOSIS — O4402 Placenta previa specified as without hemorrhage, second trimester: Secondary | ICD-10-CM | POA: Insufficient documentation

## 2022-03-27 DIAGNOSIS — O09512 Supervision of elderly primigravida, second trimester: Secondary | ICD-10-CM | POA: Diagnosis present

## 2022-03-27 DIAGNOSIS — R8271 Bacteriuria: Secondary | ICD-10-CM | POA: Diagnosis present

## 2022-03-27 DIAGNOSIS — Z362 Encounter for other antenatal screening follow-up: Secondary | ICD-10-CM

## 2022-03-27 DIAGNOSIS — Z3A32 32 weeks gestation of pregnancy: Secondary | ICD-10-CM

## 2022-03-28 ENCOUNTER — Other Ambulatory Visit: Payer: Self-pay | Admitting: *Deleted

## 2022-03-28 NOTE — Progress Notes (Signed)
[facility] Breast Pump Order form faxed.

## 2022-03-30 ENCOUNTER — Encounter: Payer: Commercial Managed Care - PPO | Admitting: Certified Nurse Midwife

## 2022-04-06 ENCOUNTER — Ambulatory Visit (INDEPENDENT_AMBULATORY_CARE_PROVIDER_SITE_OTHER): Payer: Commercial Managed Care - PPO | Admitting: Student

## 2022-04-06 ENCOUNTER — Ambulatory Visit (INDEPENDENT_AMBULATORY_CARE_PROVIDER_SITE_OTHER): Payer: Commercial Managed Care - PPO | Admitting: Licensed Clinical Social Worker

## 2022-04-06 ENCOUNTER — Encounter: Payer: Self-pay | Admitting: Student

## 2022-04-06 VITALS — BP 127/79 | HR 108 | Wt 185.6 lb

## 2022-04-06 DIAGNOSIS — F4322 Adjustment disorder with anxiety: Secondary | ICD-10-CM

## 2022-04-06 DIAGNOSIS — Z3403 Encounter for supervision of normal first pregnancy, third trimester: Secondary | ICD-10-CM

## 2022-04-06 DIAGNOSIS — O09513 Supervision of elderly primigravida, third trimester: Secondary | ICD-10-CM

## 2022-04-06 DIAGNOSIS — Z3A34 34 weeks gestation of pregnancy: Secondary | ICD-10-CM

## 2022-04-06 DIAGNOSIS — R8271 Bacteriuria: Secondary | ICD-10-CM

## 2022-04-06 DIAGNOSIS — O4400 Placenta previa specified as without hemorrhage, unspecified trimester: Secondary | ICD-10-CM

## 2022-04-06 DIAGNOSIS — O4403 Placenta previa specified as without hemorrhage, third trimester: Secondary | ICD-10-CM

## 2022-04-06 NOTE — Progress Notes (Signed)
   PRENATAL VISIT NOTE  Subjective:  Brittney Mays is a 36 y.o. G1P0000 at 52w1dbeing seen today for ongoing prenatal care.  She is currently monitored for the following issues for this low-risk pregnancy and has Anxiety state; Paresthesias; Myofascial pain; Family history of nonmelanoma skin cancer; Family history of genetic disease; Genetic testing; Mild scoliosis; Environmental allergies; Acid reflux; Infertility, female; AMA (advanced maternal age) multigravida 370+ Encounter for supervision of normal first pregnancy in first trimester; Placenta previa antepartum; and GBS bacteriuria on their problem list.  Patient reports heartburn, relieved with prilosec. Reports having one episode of heartburn the night before after having a cup of coffee.  Contractions: Not present.  .  Movement: Present. Denies leaking of fluid.   The following portions of the patient's history were reviewed and updated as appropriate: allergies, current medications, past family history, past medical history, past social history, past surgical history and problem list.   Objective:   Vitals:   04/06/22 0837  BP: 127/79  Pulse: (!) 108  Weight: 185 lb 9.6 oz (84.2 kg)    Fetal Status: Fetal Heart Rate (bpm): 140 Fundal Height: 34 cm Movement: Present     General:  Alert, oriented and cooperative. Patient is in no acute distress.  Skin: Skin is warm and dry. No rash noted.   Cardiovascular: Normal heart rate noted  Respiratory: Normal respiratory effort, no problems with respiration noted  Abdomen: Soft, gravid, appropriate for gestational age.  Pain/Pressure: Absent     Pelvic: Cervical exam deferred        Extremities: Normal range of motion.  Edema: Trace  Mental Status: Normal mood and affect. Normal behavior. Normal judgment and thought content.   Assessment and Plan:  Pregnancy: G1P0000 at 352w1d. Encounter for supervision of normal first pregnancy in third trimester -Tums and Maalox recommended  for acute episodes of heartburn. OK for patient to take omeprazole if heartburn becomes consistent symptom. Also- discussed safety of PRN imodium if experiencing loose stools. Counseled to check with provider if experiencing diarrhea with consistent conctractions.   2. [redacted] weeks gestation of pregnancy - follow-up in 2 weeks for GCCT - RSV vaccine received at health department - Is deciding between two peds offices - Would like to provide birth plan once f/u US[place] completed  3. Placenta previa antepartum - Repeat US[place]12/19. Reassessment will determine safe recommended route of delivery  4. GBS bacteriuria - F/u urine cx was normal -Ancef recommended in labor   5. Primigravida of advanced maternal age in third trimester - Normal genetic testing  - Normal Anatomy scan   Preterm labor symptoms and general obstetric precautions including but not limited to vaginal bleeding, contractions, leaking of fluid and fetal movement were reviewed in detail with the patient. Please refer to After Visit Summary for other counseling recommendations.   Return in about 2 weeks (around 04/20/2022) for LOB/GBS, IN-PERSON.  Future Appointments  Date Time Provider De[place]12/19/2023  8:30 AM [facility]12/19/2023  8:45 AM [facility] US5 [facility]12/22/2023 11:15 AM [place]-Kirby, LiKathie DikeCNM [facility] None    NiJohnston EbbsNP

## 2022-04-06 NOTE — Progress Notes (Signed)
Pt presents for ROB reports heartburn. She has relief with Omeprazole but questions is it safe to continue to take it daily.

## 2022-04-06 NOTE — Patient Instructions (Addendum)
Safe Medications in Pregnancy   Acne: Benzoyl Peroxide Salicylic Acid  Backache/Headache: Tylenol: 2 regular strength every 4 hours OR              2 Extra strength every 6 hours  Colds/Coughs/Allergies: Benadryl (alcohol free) 25 mg every 6 hours as needed Breath right strips Claritin Cepacol throat lozenges Chloraseptic throat spray Cold-Eeze- up to three times per day Cough drops, alcohol free Flonase (by prescription only) Guaifenesin Mucinex Robitussin DM (plain only, alcohol free) Saline nasal spray/drops Sudafed (pseudoephedrine) & Actifed ** use only after [redacted] weeks gestation and if you do not have high blood pressure Tylenol Vicks Vaporub Zinc lozenges Zyrtec   Constipation: Colace Ducolax suppositories Fleet enema Glycerin suppositories Metamucil Milk of magnesia Miralax Senokot Smooth move tea  Diarrhea: Kaopectate Imodium A-D  *NO pepto Bismol  Hemorrhoids: Anusol Anusol HC Preparation H Tucks  Indigestion: Tums Maalox Mylanta Zantac  Pepcid  Insomnia: Benadryl (alcohol free) '25mg'$  every 6 hours as needed Tylenol PM Unisom, no Gelcaps  Leg Cramps: Tums MagGel  Nausea/Vomiting:  Bonine Dramamine Emetrol Ginger extract Sea bands Meclizine  Nausea medication to take during pregnancy:  Unisom (doxylamine succinate 25 mg tablets) Take one tablet daily at bedtime. If symptoms are not adequately controlled, the dose can be increased to a maximum recommended dose of two tablets daily (1/2 tablet in the morning, 1/2 tablet mid-afternoon and one at bedtime). Vitamin B6 '100mg'$  tablets. Take one tablet twice a day (up to 200 mg per day).  Skin Rashes: Aveeno products Benadryl cream or '25mg'$  every 6 hours as needed Calamine Lotion 1% cortisone cream  Yeast infection: Gyne-lotrimin 7 Monistat 7   **If taking multiple medications, please check labels to avoid duplicating the same active ingredients **take medication as directed on  the label ** Do not exceed 4000 mg of tylenol in 24 hours **Do not take medications that contain aspirin or ibuprofen      AREA PEDIATRIC/FAMILY [place] [phone]) [facility] Erin Hearing, MD; Gwendlyn Deutscher, MD; Walker Kehr, MD; Andria Frames, MD; McDiarmid, MD; Dutch Quint, MD; Nori Riis, MD; Mingo Amber, [place] [address] [place], [place] [address] [phone] Mon-Fri 8:30-12:30, 1:30-5:00 Providers come to see babies at [place] [facility] providers who accept newborns: Dorthy Cooler, MD; Orland Mustard, MD; Stephanie Acre, MD [address], [place], [place] [address] [phone] Mon-Fri 8:00-5:30 Babies seen by providers at [facility] Does NOT accept Medicaid Please call early in hospitalization for appointment (limited availability)  [place], MD [address], [place], [place] [address] [phone] Mon, Tue, Thur, Fri 8:30-5:00, Wed 10:00-7:00 (closed 1-2pm) Babies seen by [facility] providers Accepting Medicaid [place], [place]. [place], [place], [place] [address] [phone] Mon-Fri 8:30-5:00, Sat 8:30-12:00 Provider comes to see babies at [facility] Accepting Medicaid Must have been referred from current patients or contacted office prior to delivery [place] for Child and [place] ([facility] for [place]) Owens Shark, MD; Tamera Punt, MD; Doneen Poisson, MD; Fatima Sanger, MD; Wynetta Emery, MD; Jess Barters, MD; Tami Ribas, MD; Herbert Moors, MD; Derrell Lolling, MD; Dorothyann Peng, MD; Lucious Groves, NP; Baldo Ash, NP [address], [place], [place] [address] [phone] Mon, [place] Poot, Thur, Fri 8:30-5:30, Wed 9:30-5:30, Sat 8:30-12:30 Babies seen by [facility] providers Accepting Medicaid Only accepting infants of first-time parents or siblings of current patients Hospital discharge coordinator will make  follow-up appointment Baltazar Najjar [address] [place], [place]  [address] [phone]  Fax - [phone] [facility] [place]. [address], [place], [place]  [address] Phone - [phone]   Fax - [place] [phone] [place], [place], [place]  [address] [phone]  [place] [phone]) [facility] Redmond Baseman, MD; Jacklynn Ganong, MD; Torrie Mayers, MD; MD; Rosana Hoes, MD; Servando Salina, MD; Rose Fillers, MD; Rex Kras, MD; Corinna Capra, MD; Volney American, MD; Trilby Drummer, MD; Janann Colonel, MD; Jimmye Norman, [place], [place], [place] [address] [phone] Mon-Fri 8:30-5:00 (extended evenings Mon-Thur as needed), Sat-Sun 10:00-1:00 Providers come to see babies at [facility] Accepting Medicaid for families of first-time babies and families with all children in the household age 49 and under. Must register with office prior to making appointment (M-F only). [place], NP; Tomi Bamberger, MD; Redmond School, MD; [place], [place] [address], [place], [place] [address] [phone] Mon-Fri 8:00-5:00 Babies seen by providers at [facility] Does NOT accept Medicaid/Commercial Insurance Only [facility] at [place] ([facility])  Sabino Gasser, MD; Drema Dallas, MD; Montine Circle, MD; Vilma Prader, MD; Vanita Panda, MD; Alfonso Ramus, MD; Ruthann Cancer, MD; Roxanne Mins, MD; Rosalva Ferron, MD; Polly Cobia, MD [address] [place], [place] [address] [phone] Mon-Fri 8:30-5:30, Sat (Oct.-Mar.) 9:00-1:00 Babies seen by providers at [place] [phone]) [place], MD; Suzan Slick, MD [address] [place] [address], [place], [place] [address] [phone] Mon-Fri 8:30-5:00, Sat 8:30-12:00 Providers come to see babies at [facility] Does NOT accept Medicaid [place] at Arlina [place], [place]; Mannie Stabile, MD; [place], [place]; Nancy Fetter, MD; Moreen Fowler, [place], [place], [place]  [address] [phone] Mon-Fri 8:00-5:00 Babies seen by providers at [facility] Does NOT accept Medicaid Only accepting babies of parents who are patients Please call early in hospitalization for appointment (limited availability) [facility] Pediatricians Carlis Abbott, MD; Sharlene Motts, MD; Rod Can, MD; Warner Mccreedy, NP; Sabra Heck, MD; Ermalinda Memos, MD; Sharlett Iles, NP; Aurther Loft, MD; Jerrye Beavers, MD; Marcello Moores, MD; Berline Lopes, MD; Charolette Forward, MD [place]. [place], [place], [place] [address] [phone] Mon-Fri 8:00-5:00, Sat 9:00-12:00 Providers come to see babies at [facility] Does NOT accept [facility] [phone]) [place] at [place] providers accepting new patients: Dayna Ramus, NP; Rolland Porter, [place], [place], [place] [address] [phone] Mon-Fri 8:00-5:00 Babies seen by providers at [facility] Does NOT accept Medicaid Only accepting babies of parents who are patients Please call early in hospitalization for appointment (limited availability) [facility] Abner Greenspan, MD; Sheran Lawless, MD [address]., [place], [place] [address] [phone] (press 1 to schedule appointment) Mon-Fri 8:00-5:00 Providers come to see babies at [facility] Does NOT accept [facility], MD [address] [place], [place] [address] [phone] Mon-Fri 8:30-5:00 (lunch 12:30-1:00), extended hours by appointment only Wed 5:00-6:30 Babies seen by [facility] providers Accepting Medicaid [place] at Nadeen Landau, MD; [place], [place]; Ethlyn Gallery, [place], [place], [place] [address] [phone] Mon-Fri 8:00-5:00 Babies seen by [facility] providers Does NOT accept Medicaid [place] at [place], MD; [place], MD; [place], [place] [address] [place]., [place],  [address] [phone] Mon-Fri 8:00-5:00 Babies seen by [facility] providers Does NOT accept [facility] [place], [place]; [place], [place]; Margaret, NP; Albertina Parr, MD; Frederic Jericho, MD; Ronney Lion, MD; Carlos Levering, NP; Jerelene Redden, NP; Tomasita Crumble, NP; Ronelle Nigh, NP; Corinna Lines, MD; Karsten Ro, [place] [address] [place],  [address] [phone] Mon-Fri 8:30-5:00, Sat 10:00-1:00 Providers come to see babies at [facility] Does NOT accept Medicaid Free prenatal information session Tuesdays at 4:45pm [facility] [place], MD; [place], [place]; [place], [place]; [place], [place]., [place] [address] [phone] Mon-Fri 7:30-5:30 Babies seen by [place] [address] [place], [place], [place]  [address] [phone]   Fax - [phone]  [place] [phone] & [phone]) [place] [address] [address]., [place], [place] [address] [phone] Mon-Thur 8:00-6:00 Providers come to see babies at [place], NP; Melford Aase, MD; [place], [place]; [place], [place] [address]., [place], [place] [address] [phone] Mon-Thur 7:30-7:30, Fri 7:30-4:30 Babies seen by [facility] providers Accepting Medicaid [place], MD; Cristino Martes, NP; Gertie Baron, MD [place], [place], [place] [address] [phone] Mon-Fri 8:30-5:00, Sat 8:30-12:00 Providers come to see babies at [facility] Accepting Medicaid Must have "Meet & Greet" appointment at office prior to delivery [place] ([place]) Faythe Dingwall, MD; Juleen China, MD; Clydene Laming, [place] [address], [place], [place] [address] [phone] Mon-Wed 8:00-6:00, Thur-Fri 8:00-5:00, Sat 9:00-12:00 Providers come to see babies at [facility] Does NOT accept Medicaid Only accepting siblings of current patients [facility] [place], [employer], [place], [place]  [address] [phone]   Fax  - [phone] [place] at [place]. [address], [place], [place]  [address] [phone]   Fax - Wink [phone] & [phone]) [place] at [facility], [place]; [place], [place] [address]., [place], [place] [address] [phone] Mon-Fri 7:00-5:00 Babies seen by [facility] providers Does NOT accept Medicaid [place], MD; [place], [place]; [place], [place] [address], [place], [place] [address] [phone] Mon-Fri 8:00-5:00 Babies seen by [facility] providers Accepting [facility] [place], MD; [place], [place]; [place], NP; [place], [place] [address] [place], [place], [place] [address] [phone] Mon-Fri 8:00-5:00 Babies seen by providers at [place] [phone]) [facility] at [place], [place] [address] Madelaine Bhat [place], [place] [address] [phone] Mon-Fri 8:00-5:00 Babies seen by [facility] providers Does NOT accept Medicaid Limited availability, please call early in hospitalization to schedule follow-up [facility] Kennedy Bucker, [place]; Maisie Fus, MD; Charlesetta Garibaldi, MD; [place], [place]; Jeannine Kitten, MD; [place], [place] [address], [place], [place] [address] [phone] Mon-Fri 8:30-5:00, Sat 9:00-12:00 Babies seen by providers at [facility] Accepting Medicaid Please register online then schedule online or call office [email or web address] [place] ([place] at [place]) Yong Channel, NP; Dwyane Dee, MD; Leonidas Romberg, [place] [address] [place], [place], [place] [address] [phone] Mon-Fri 8:00-5:00 Babies seen by providers at [place] ([facility] [place]) Floral, MD; Rayvon Char, NP; Melina Modena, MD [address] [place], [place],  [address] [phone] Mon-Fri 8:00-5:30, Sat&Sun by appointment (phones open at 8:30) Babies seen by [facility] providers Accepting Medicaid Must be a first-time baby or sibling of current patient [place]  [address], [place],   [phone]   Fax - [phone]  [place] 2 [phone] & [phone]) [place], [place]; [place], [place]; Cheyenne Wells, MD; [place], [place]; [place], MD [address] [place],  [address] [phone] Mon-Thur 8:00-7:00, Fri 8:00-5:00, Sat 8:00-12:00, Sun 9:00-12:00 Babies seen by [facility] providers Accepting Medicaid [facility] - [facility] Medicine at Mikey College, MD; Ruthann Cancer, MD; [facility], MD [address], [place], [place] [address] [phone] Mon-Thur 8:00-5:00 Babies seen by providers at [place] at Magdalene Molly, MD; Coe[place], MD; Amedeo Plenty, MD; Bobby Rumpf, MD; List, MD; Lavonia Drafts, MD; Ruthann Cancer, MD; Selinda Eon, MD; Audie Box, MD; Jim Like, MD; Christie Nottingham, MD; Hubbard Hartshorn, MD; Modena Nunnery, MD [address] Barbara Cower [place], [place] [address] [phone] Mon-Fri 8:00-5:30, Sat (Oct.-Mar.) 9:00-1:00 Babies seen by providers at [facility] Accepting Medicaid Must fill out new patient packet, available online at [email or web address] ([facility]) Fredderick Severance, NP; Kenton Kingfisher, NP; Claiborne Billings, NP; Rolla Plate, MD; [place], [place]; Carola Rhine, MD; Del Carmen, MD; Delia Chimes, NP [address], [place], [place] [address] [phone] Mon-Thur 8:00-5:30, Fri 8:00-5:00 Babies seen by providers at [place] [phone]) [place], [place]; [place], MD; [place], MD; [place], [place] [address] [place], [place] [address] [phone] Mon-Fri  8:00-5:00 Babies seen by providers at [place] [phone]) [place] at [facility], [place]; Olen Pel, MD; [place], [address], [place], [place] [address] [phone] Mon-Fri 8:00-5:00 Babies seen by providers at [facility] Does NOT accept Medicaid Limited appointment availability, please call early in hospitalization  [place] at [facility], [place]; [place], MD [address] [place], [place] [address] [phone] Mon-Fri 8:00-5:00 Babies seen by [facility] providers Does NOT accept Medicaid [place], MD; Guy Sandifer, MD; [place], [place]; [place], MD [address], [place], [place] [address] [phone] Mon-Fri 8:00-5:00 After hours clinic [facility] [place], [place] [address]) [phone] Mon-Fri 5:00-8:00, Sat 12:00-6:00, Sun 10:00-4:00 Babies seen by [facility] providers Accepting Medicaid [place] at [facility] [address], [place], [place]  [address] [phone]   Fax - [phone]  [place] [phone]) [place] at [facility], [place] [address] [place], [place], [place] [address] [phone] Mon-Fri 8:00-5:00 Babies seen by [facility] providers Does NOT accept Medicaid [place] ([place] at [place]) Daron Offer, [place] [address] [place] [address], [place], [place] [address] [phone] Mon-Thur 8:00-7:00, Fri 8:00-5:00, Sat 8:00-12:00 Babies seen by providers at [facility] Accepting Medicaid - but does not have vaccinations in office (must be received elsewhere) Limited availability, please call early in hospitalization  [place] [phone]) [place], MD [address] [place] [address] [phone]  Fax [phone]  [facility]   Lauretta Chester, MD, [place], [place], [place], [place], [place], [place] [address] [facility]  [place], [place], [place] [address] [place], [place], [place] [address] [phone] ([place])  [facility] [address] [place], [place] [address] [place], [place], [place] [address] [phone] [facility] [address] [place], [place], [place] [address] [phone] [facility] [address], [place], [place] [address] [place], [place], [place] [address] [facility] [address] [place], [place] [address] [phone] [place]. [address], [place], [place] [address] [phone] Dr. Okey Regal. [place] [address] [place],  [address] [phone] [facility] ([place]) [address], [address] [place], [place] [address] [facility] [address] [place], [place] [address] [phone]

## 2022-04-06 NOTE — BH Specialist Note (Signed)
Integrated Behavioral Health Initial In-Person Visit  MRN: [id number] Name: Brittney Mays  Number of [place] Clinician visits: 1 Session Start time:   9am Session End time: 9:45 am Total time in minutes: 45 mins at [facility]  Types of Service: Individual psychotherapy  Interpretor:No. Interpretor Name and Language: none   Warm Hand Off Completed.        Subjective: Brittney Mays is a 36 y.o. female accompanied by n/a Patient was referred by self-referral for anxious mood.  Patient reports the following symptoms/concerns: anxious mood,  Duration of problem: approx 4 months ; Severity of problem: mild  Objective: Mood: Anxious and Affect: Appropriate Risk of harm to self or others: No plan to harm self or others  Life Context: Family and Social: Lives with spouse  School/Work: Tourist information centre manager  Self-Care: quality time with spouse  Life Changes: New pregnancy  Patient and/or Family's Strengths/Protective Factors: Concrete supports in place (healthy food, safe environments, etc.)  Goals Addressed: Patient will: Reduce symptoms of: anxiety Increase knowledge and/or ability of: coping skills  Demonstrate ability to: Increase adequate support systems for patient/family  Progress towards Goals: Ongoing  Interventions: Interventions utilized: Motivational Interviewing and Supportive Counseling  Standardized Assessments completed: Not Needed  Patient and/or Family Response: Brittney Mays reports unfavorable expectations with OB practice since start of OB care. Brittney Mays reports engaging in stress reducing activity to reduce anxious mood prior to ob visits.    Assessment: Patient currently experiencing adjustment disorder with anxious mood.   Patient may benefit from integrated behavioral health.  Plan: Follow up with behavioral health clinician on : two weeks  Behavioral recommendations: prioritizing rest, stress reducing activity, continue  to self advocate and communicate needs  Referral(s): [place] (In Clinic) "From scale of 1-10, how likely are you to follow plan?":    Lynnea Ferrier, LCSW

## 2022-04-18 ENCOUNTER — Ambulatory Visit: Payer: Commercial Managed Care - PPO | Attending: Obstetrics and Gynecology

## 2022-04-18 ENCOUNTER — Telehealth: Payer: Commercial Managed Care - PPO | Admitting: Physician Assistant

## 2022-04-18 ENCOUNTER — Ambulatory Visit: Payer: Commercial Managed Care - PPO | Admitting: *Deleted

## 2022-04-18 VITALS — BP 116/72 | HR 81

## 2022-04-18 DIAGNOSIS — O4440 Low lying placenta NOS or without hemorrhage, unspecified trimester: Secondary | ICD-10-CM | POA: Insufficient documentation

## 2022-04-18 DIAGNOSIS — O4400 Placenta previa specified as without hemorrhage, unspecified trimester: Secondary | ICD-10-CM

## 2022-04-18 DIAGNOSIS — R8271 Bacteriuria: Secondary | ICD-10-CM

## 2022-04-18 DIAGNOSIS — Z3A35 35 weeks gestation of pregnancy: Secondary | ICD-10-CM

## 2022-04-18 DIAGNOSIS — H02824 Cysts of left upper eyelid: Secondary | ICD-10-CM | POA: Diagnosis not present

## 2022-04-18 DIAGNOSIS — O09513 Supervision of elderly primigravida, third trimester: Secondary | ICD-10-CM | POA: Diagnosis not present

## 2022-04-18 DIAGNOSIS — O99891 Other specified diseases and conditions complicating pregnancy: Secondary | ICD-10-CM

## 2022-04-18 MED ORDER — CEPHALEXIN 500 MG PO CAPS: 500.0000 mg | ORAL_CAPSULE | Freq: Two times a day (BID) | ORAL | 0 refills | Status: AC

## 2022-04-18 NOTE — Patient Instructions (Signed)
  Waymon Budge, thank you for joining Leeanne Rio, PA-C for today's virtual visit.  While this provider is not your primary care provider (PCP), if your PCP is located in our provider database this encounter information will be shared with them immediately following your visit.   [facility] account gives you access to today's visit and all your visits, tests, and labs performed at [facility] " click here if you don't have a [facility] account or go to [email or web address]  Consent: (Patient) RAMONA RUARK provided verbal consent for this virtual visit at the beginning of the encounter.  Current Medications:  Current Outpatient Medications:    cephALEXin (KEFLEX) 500 MG capsule, Take 1 capsule (500 mg total) by mouth 2 (two) times daily for 7 days., Disp: 14 capsule, Rfl: 0   ASHWAGANDHA PO, Take by mouth., Disp: , Rfl:    aspirin EC 81 MG tablet, Take 1 tablet (81 mg total) by mouth daily. Swallow whole., Disp: 30 tablet, Rfl: 12   calcium carbonate (TUMS - DOSED IN MG ELEMENTAL CALCIUM) 500 MG chewable tablet, Chew 1 tablet by mouth daily., Disp: , Rfl:    cetirizine (ZYRTEC) 10 MG tablet, Take 10 mg by mouth daily., Disp: , Rfl:    Elastic Bandages & Supports (COMFORT FIT MATERNITY SUPP MED) MISC, Wear daily when ambulating, Disp: 1 each, Rfl: 0   omeprazole (PRILOSEC) 20 MG capsule, Take 20 mg by mouth daily., Disp: , Rfl:    Prenatal Vit-Fe Fumarate-FA (MULTIVITAMIN-PRENATAL) 27-0.8 MG TABS tablet, Take 1 tablet by mouth daily at 12 noon., Disp: , Rfl:    Medications ordered in this encounter:  Meds ordered this encounter  Medications   cephALEXin (KEFLEX) 500 MG capsule    Sig: Take 1 capsule (500 mg total) by mouth 2 (two) times daily for 7 days.    Dispense:  14 capsule    Refill:  0    Order Specific Question:   Supervising Provider    Answer:   Chase Picket [id number]     *If you need refills on other medications  prior to your next appointment, please contact your pharmacy*  Follow-Up: Call back or seek an in-person evaluation if the symptoms worsen or if the condition fails to improve as anticipated.  [facility] [phone]  Other Instructions Keep skin clean and dry Apply warm compress for 10 minutes a few times per day. Take antibiotic as directed. If not resolving or anything worsening despite treatment, I want you to be evaluated in person.    If you have been instructed to have an in-person evaluation today at a local Urgent Care facility, please use the link below. It will take you to a list of all of our available [facility], including address, phone number and hours of operation. Please do not delay care.  [facility]  If you or a family member do not have a primary care provider, use the link below to schedule a visit and establish care. When you choose a [facility] primary care physician or advanced practice provider, you gain a long-term partner in health. Find a Primary Care Provider  Learn more about [facility]'s in-office and virtual care options: [facility]

## 2022-04-18 NOTE — Progress Notes (Signed)
Virtual Visit Consent   Brittney Mays, you are scheduled for a virtual visit with a [facility] provider today. Just as with appointments in the office, your consent must be obtained to participate. Your consent will be active for this visit and any virtual visit you may have with one of our providers in the next 365 days. If you have a MyChart account, a copy of this consent can be sent to you electronically.  As this is a virtual visit, video technology does not allow for your provider to perform a traditional examination. This may limit your provider's ability to fully assess your condition. If your provider identifies any concerns that need to be evaluated in person or the need to arrange testing (such as labs, EKG, etc.), we will make arrangements to do so. Although advances in technology are sophisticated, we cannot ensure that it will always work on either your end or our end. If the connection with a video visit is poor, the visit may have to be switched to a telephone visit. With either a video or telephone visit, we are not always able to ensure that we have a secure connection.  By engaging in this virtual visit, you consent to the provision of healthcare and authorize for your insurance to be billed (if applicable) for the services provided during this visit. Depending on your insurance coverage, you may receive a charge related to this service.  I need to obtain your verbal consent now. Are you willing to proceed with your visit today? Brittney Mays has provided verbal consent on 04/18/2022 for a virtual visit (video or telephone). Leeanne Rio, [place]  Date: 04/18/2022 3:24 PM  Virtual Visit via Video Note   I, Leeanne Rio, connected with  Brittney Mays  ([id number], 1985/07/02) on 04/18/22 at  3:15 PM EST by a video-enabled telemedicine application and verified that I am speaking with the correct person using two identifiers.  Location: Patient: Virtual  Visit Location Patient: Home Provider: Virtual Visit Location Provider: Home Office   I discussed the limitations of evaluation and management by telemedicine and the availability of in person appointments. The patient expressed understanding and agreed to proceed.    History of Present Illness: Brittney Mays is a 36 y.o. who identifies as a female who was assigned female at birth, and is being seen today for a swollen and tender area at the top of her left upper eyelid starting a few days ago. Notes the knot there is firm and very tender. No vision changes, drainage form eye/eyelid, conjunctival redness, etc. Denies fever, chills. Denies URI symptoms. Marland Kitchen    HPI: HPI  Problems:  Patient Active Problem List   Diagnosis Date Noted   GBS bacteriuria 02/02/2022   Placenta previa antepartum 01/30/2022   AMA (advanced maternal age) multigravida 35+ 11/04/2021   Encounter for supervision of normal first pregnancy in first trimester 11/04/2021   Infertility, female 06/14/2021   Environmental allergies 02/10/2021   Acid reflux 02/10/2021   Genetic testing 07/12/2020   Family history of genetic disease 07/02/2020   Family history of nonmelanoma skin cancer    Paresthesias 04/15/2019   Myofascial pain 04/15/2019   Anxiety state 02/12/2018   Mild scoliosis 07/23/2015    Allergies:  Allergies  Allergen Reactions   Amoxicillin Hives   Medications:  Current Outpatient Medications:    cephALEXin (KEFLEX) 500 MG capsule, Take 1 capsule (500 mg total) by mouth 2 (two) times daily for 7 days.,  Disp: 14 capsule, Rfl: 0   ASHWAGANDHA PO, Take by mouth., Disp: , Rfl:    aspirin EC 81 MG tablet, Take 1 tablet (81 mg total) by mouth daily. Swallow whole., Disp: 30 tablet, Rfl: 12   calcium carbonate (TUMS - DOSED IN MG ELEMENTAL CALCIUM) 500 MG chewable tablet, Chew 1 tablet by mouth daily., Disp: , Rfl:    cetirizine (ZYRTEC) 10 MG tablet, Take 10 mg by mouth daily., Disp: , Rfl:    Elastic  Bandages & Supports (COMFORT FIT MATERNITY SUPP MED) MISC, Wear daily when ambulating, Disp: 1 each, Rfl: 0   omeprazole (PRILOSEC) 20 MG capsule, Take 20 mg by mouth daily., Disp: , Rfl:    Prenatal Vit-Fe Fumarate-FA (MULTIVITAMIN-PRENATAL) 27-0.8 MG TABS tablet, Take 1 tablet by mouth daily at 12 noon., Disp: , Rfl:   Observations/Objective: Patient is well-developed, well-nourished in no acute distress.  Resting comfortably at home.  Head is normocephalic, atraumatic.  No labored breathing. Speech is clear and coherent with logical content.  Patient is alert and oriented at baseline.  Cystic lesion visible of her supraorbital region, just crossing the top portion of upper eyelid, maybe 1 cm in diameter with some surrounding redness and tenderness. Area is mobile per patient  with demonstrated palpation. No conjunctival injection appreciated of either eye. Pupils are equal and round.   Assessment and Plan: 1. Cyst of left upper eyelid - cephALEXin (KEFLEX) 500 MG capsule; Take 1 capsule (500 mg total) by mouth 2 (two) times daily for 7 days.  Dispense: 14 capsule; Refill: 0  No visible stye. This is much higher up on her skin. No alarm signs or symptoms. Continue warm compresses. Keep skin clean and dry.  Keflex per orders as she tolerates cephalosporins and is currently pregnant.  Follow Up Instructions: I discussed the assessment and treatment plan with the patient. The patient was provided an opportunity to ask questions and all were answered. The patient agreed with the plan and demonstrated an understanding of the instructions.  A copy of instructions were sent to the patient via MyChart unless otherwise noted below.   The patient was advised to call back or seek an in-person evaluation if the symptoms worsen or if the condition fails to improve as anticipated.  Time:  I spent 10 minutes with the patient via telehealth technology discussing the above problems/concerns.    Leeanne Rio, PA-C

## 2022-04-21 ENCOUNTER — Ambulatory Visit (INDEPENDENT_AMBULATORY_CARE_PROVIDER_SITE_OTHER): Payer: Commercial Managed Care - PPO | Admitting: Licensed Clinical Social Worker

## 2022-04-21 ENCOUNTER — Ambulatory Visit (INDEPENDENT_AMBULATORY_CARE_PROVIDER_SITE_OTHER): Payer: Commercial Managed Care - PPO | Admitting: Advanced Practice Midwife

## 2022-04-21 ENCOUNTER — Other Ambulatory Visit (HOSPITAL_COMMUNITY)
Admission: RE | Admit: 2022-04-21 | Discharge: 2022-04-21 | Disposition: A | Discharge status: Home / Self Care | Payer: Commercial Managed Care - PPO | Source: Ambulatory Visit | Attending: Advanced Practice Midwife | Admitting: Advanced Practice Midwife

## 2022-04-21 VITALS — BP 126/77 | HR 82 | Wt 190.7 lb

## 2022-04-21 DIAGNOSIS — R8271 Bacteriuria: Secondary | ICD-10-CM

## 2022-04-21 DIAGNOSIS — Z3401 Encounter for supervision of normal first pregnancy, first trimester: Secondary | ICD-10-CM | POA: Diagnosis not present

## 2022-04-21 DIAGNOSIS — O1203 Gestational edema, third trimester: Secondary | ICD-10-CM

## 2022-04-21 DIAGNOSIS — Z3A36 36 weeks gestation of pregnancy: Secondary | ICD-10-CM | POA: Insufficient documentation

## 2022-04-21 DIAGNOSIS — O4443 Low lying placenta NOS or without hemorrhage, third trimester: Secondary | ICD-10-CM

## 2022-04-21 DIAGNOSIS — F4322 Adjustment disorder with anxiety: Secondary | ICD-10-CM | POA: Diagnosis not present

## 2022-04-21 NOTE — BH Specialist Note (Signed)
Integrated Behavioral Health Follow Up In-Person Visit  MRN: [id number] Name: Brittney Mays  Number of [facility] Clinician visits: 2 Session Start time:  10:30AM Session End time: 11:00AM Total time in minutes: 30 mins in person at [facility]   Types of Service: Individual psychotherapy  Interpretor:No. Interpretor Name and Language: none  Subjective: Brittney Mays is a 36 y.o. female accompanied by n/a Patient was referred by L Amedeo Gory for anxious mood. Patient reports the following symptoms/concerns: anxious mood Duration of problem: approx 4 months ; Severity of problem: mild  Objective: Mood: good and Affect: Appropriate Risk of harm to self or others: No plan to harm self or others  Life Context: Family and Social: Lives with spouse  School/Work: employed  Self-Care: quality time with spouse  Life Changes: new pregnancy  Patient and/or Family's Strengths/Protective Factors: Concrete supports in place (healthy food, safe environments, etc.)  Goals Addressed: Patient will:  Reduce symptoms of: anxiety   Increase knowledge and/or ability of: coping skills   Demonstrate ability to: Increase healthy adjustment to current life circumstances  Progress towards Goals: Ongoing  Interventions: Interventions utilized:  Supportive Counseling Standardized Assessments completed: Not Needed  Patient and/or Family Response: Ms. Stlouis reports improvement in mood however expressed concerns regarding nightmares.     Assessment: Patient currently experiencing adjustment disorder with anxious mood.   Patient may benefit from integrated behavioral health.  Plan: Follow up with behavioral health clinician on : as needed  Behavioral recommendations:   prioritizing rest, stress reducing activity, continue to self advocate and communicate needs   Referral(s): [place] (In Clinic) "From scale of 1-10, how likely are  you to follow plan?":    Lynnea Ferrier, LCSW

## 2022-04-21 NOTE — Progress Notes (Signed)
   PRENATAL VISIT NOTE  Subjective:  Brittney Mays is a 36 y.o. G1P0000 at 61w2dbeing seen today for ongoing prenatal care.  She is currently monitored for the following issues for this low-risk pregnancy and has Anxiety state; Paresthesias; Myofascial pain; Family history of nonmelanoma skin cancer; Family history of genetic disease; Genetic testing; Mild scoliosis; Environmental allergies; Acid reflux; Infertility, female; AMA (advanced maternal age) multigravida 359+ Encounter for supervision of normal first pregnancy in first trimester; Placenta previa antepartum; and GBS bacteriuria on their problem list.  Patient reports  edema in hands and feet .  Contractions: Not present. Vag. Bleeding: None.  Movement: Present. Denies leaking of fluid.   The following portions of the patient's history were reviewed and updated as appropriate: allergies, current medications, past family history, past medical history, past social history, past surgical history and problem list.   Objective:   Vitals:   04/21/22 1131  BP: 126/77  Pulse: 82  Weight: 190 lb 11.2 oz (86.5 kg)    Fetal Status: Fetal Heart Rate (bpm): 145 Fundal Height: 36 cm Movement: Present     General:  Alert, oriented and cooperative. Patient is in no acute distress.  Skin: Skin is warm and dry. No rash noted.   Cardiovascular: Normal heart rate noted  Respiratory: Normal respiratory effort, no problems with respiration noted  Abdomen: Soft, gravid, appropriate for gestational age.  Pain/Pressure: Absent     Pelvic: Cervical exam deferred        Extremities: Normal range of motion.  Edema: Mild pitting, slight indentation  Mental Status: Normal mood and affect. Normal behavior. Normal judgment and thought content.   Assessment and Plan:  Pregnancy: G1P0000 at 330w2d. Encounter for supervision of normal first pregnancy in first trimester --Anticipatory guidance about next visits/weeks of pregnancy given.  --Questions  answered about s/sx of labor, where to go in the hospital, GBS treatment, etc. --Vertex by [place] 04/18/22  - Cervicovaginal ancillary only  2. [redacted] weeks gestation of pregnancy  - Cervicovaginal ancillary only  3. GBS bacteriuria --Tx during labor  4. Edema during pregnancy in third trimester --hands and feet swollen, BP grossly normal, no s/sx of PEC --Pt to take BP on home cuff, f/u if elevated  5. Low lying placenta nos or without hemorrhage, third trimester --Resolved on most recent [place]12/19/23 --Ok for vaginal delivery per MFM    Term labor symptoms and general obstetric precautions including but not limited to vaginal bleeding, contractions, leaking of fluid and fetal movement were reviewed in detail with the patient. Please refer to After Visit Summary for other counseling recommendations.   Return in about 1 week (around 04/28/2022) for As scheduled.  Future Appointments  Date Time Provider DeBarrington12/29/2023  8:55 AM DaLaury DeepCNM [facility]  05/05/2022 10:35 AM Leftwich-Kirby, LiKathie DikeCNM [facility] None  05/10/2022  8:35 AM DaLaury DeepCNM [facility]  05/17/2022  9:35 AM EmGavin PoundCNM [facility] None    LiFatima BlankCNM

## 2022-04-21 NOTE — Patient Instructions (Signed)
Labor Precautions Reasons to come to [facility]U at [facility]:  1.  Contractions are  5 minutes apart or less, each last 1 minute, these have been going on for 1-2 hours, and you cannot walk or talk during them 2.  You have a large gush of fluid, or a trickle of fluid that will not stop and you have to wear a pad 3.  You have bleeding that is bright red, heavier than spotting--like menstrual bleeding (spotting can be normal in early labor or after a check of your cervix) 4.  You do not feel the baby moving like he/she normally does  

## 2022-04-21 NOTE — Progress Notes (Signed)
Patient presents for ROB. Patient is GBS + in her urine. GBS TOC completed 11/16. Self Swab completed for GC/CH. Patient has a few follow up questions to discuss.

## 2022-04-22 ENCOUNTER — Encounter: Payer: Self-pay | Admitting: Advanced Practice Midwife

## 2022-04-25 LAB — CERVICOVAGINAL ANCILLARY ONLY
Chlamydia: NEGATIVE
Comment: NEGATIVE
Comment: NORMAL
Neisseria Gonorrhea: NEGATIVE

## 2022-04-28 ENCOUNTER — Encounter: Payer: Self-pay | Admitting: Obstetrics and Gynecology

## 2022-04-28 ENCOUNTER — Ambulatory Visit (INDEPENDENT_AMBULATORY_CARE_PROVIDER_SITE_OTHER): Payer: Commercial Managed Care - PPO | Admitting: Obstetrics and Gynecology

## 2022-04-28 VITALS — BP 121/71 | HR 83 | Wt 194.4 lb

## 2022-04-28 DIAGNOSIS — Z3A37 37 weeks gestation of pregnancy: Secondary | ICD-10-CM

## 2022-04-28 DIAGNOSIS — Z3403 Encounter for supervision of normal first pregnancy, third trimester: Secondary | ICD-10-CM

## 2022-04-28 DIAGNOSIS — O09513 Supervision of elderly primigravida, third trimester: Secondary | ICD-10-CM

## 2022-04-28 DIAGNOSIS — R8271 Bacteriuria: Secondary | ICD-10-CM

## 2022-04-28 DIAGNOSIS — O4443 Low lying placenta NOS or without hemorrhage, third trimester: Secondary | ICD-10-CM

## 2022-04-28 NOTE — Progress Notes (Signed)
Patient presents for [place] visit. No concerns at this time.

## 2022-04-28 NOTE — Progress Notes (Addendum)
   LOW-RISK PREGNANCY OFFICE VISIT Patient name: JAELIANNA SANON MRN [id number]  Date of birth: [birth year] Chief Complaint:   Routine Prenatal Visit  History of Present Illness:   ANTANETTE CONRY is a 36 y.o. G36P0000 female at [redacted]w[redacted]d with an Estimated Date of Delivery: 05/17/22 being seen today for ongoing management of a low-risk pregnancy.  Today she reports carpal tunnel symptoms. She has a printed copy of her birth plan to be reviewed.  Contractions: Not present. Vag. Bleeding: None.  Movement: Present. denies leaking of fluid. Review of Systems:   Pertinent items are noted in HPI Denies abnormal vaginal discharge w/ itching/odor/irritation, headaches, visual changes, shortness of breath, chest pain, abdominal pain, severe nausea/vomiting, or problems with urination or bowel movements unless otherwise stated above. Pertinent History Reviewed:  Reviewed past medical,surgical, social, obstetrical and family history.  Reviewed problem list, medications and allergies. Physical Assessment:   Vitals:   04/28/22 0905  BP: 121/71  Pulse: 83  Weight: 194 lb 6.4 oz (88.2 kg)  Body mass index is 30.45 kg/m.        Physical Examination:   General appearance: Well appearing, and in no distress  Mental status: Alert, oriented to person, place, and time  Skin: Warm & dry  Cardiovascular: Normal heart rate noted  Respiratory: Normal respiratory effort, no distress  Abdomen: Soft, gravid, nontender  Pelvic: Cervical exam deferred         Extremities: Edema: Mild pitting, slight indentation  Fetal Status: Fetal Heart Rate (bpm): 146   Movement: Present    No results found for this or any previous visit (from the past 24 hour(s)).  Assessment & Plan:  1) Low-risk pregnancy G1P0000 at [redacted]w[redacted]d with an Estimated Date of Delivery: 05/17/22   2) Encounter for supervision of normal first pregnancy in third trimester - Wearing wrist splint on RT wrist, but not really at bedtime. Advised its'  best to wear at night to prevent increased sx's at bedtime. - Discussed s/sx's labor - Reviewed birth plan -- no edits recommended - Pediatrician selected -- Dr. Corie Chiquito with [facility] in [facility]   3) GBS bacteriuria - Abx in labor -- ok for Ancef  4) Low lying placenta nos or without hemorrhage, third trimester - Stable per last U/S  5) Primigravida of advanced maternal age in third trimester   6) [redacted] weeks gestation of pregnancy    Meds: No orders of the defined types were placed in this encounter.  Labs/procedures today: none  Plan:  Continue routine obstetrical care   Reviewed: Term labor symptoms and general obstetric precautions including but not limited to vaginal bleeding, contractions, leaking of fluid and fetal movement were reviewed in detail with the patient.  All questions were answered. Has home bp cuff. Check bp weekly, let us know if >140/90.   Follow-up: Return in about 1 week (around 05/05/2022) for Return OB visit.  No orders of the defined types were placed in this encounter.  Raelyn Mora MSN, CNM 04/28/2022 9:43 AM

## 2022-04-30 DIAGNOSIS — O09513 Supervision of elderly primigravida, third trimester: Secondary | ICD-10-CM | POA: Insufficient documentation

## 2022-04-30 DIAGNOSIS — O4443 Low lying placenta NOS or without hemorrhage, third trimester: Secondary | ICD-10-CM | POA: Insufficient documentation

## 2022-05-01 NOTE — L&D Delivery Note (Addendum)
OB/GYN Faculty Practice Delivery Note  Brittney Mays is a 37 y.o. G1P0000 s/p VD at [redacted]w[redacted]d She was admitted for PROM  ROM: 21h 461mith clear fluid GBS Status: POS ancef   Maximum Maternal Temperature: 98.6 F  Labor Progress: Initial SVE: 1/50/-3. She then progressed to complete.   Delivery Date/Time: 05/20/22 2202 Delivery: Called to room and patient was complete and pushing. Head delivered LOA. Nuchal cord present x1. Shoulder and body delivered in usual fashion. Infant with spontaneous cry, placed on mother's abdomen, dried and stimulated. Cord clamped x 2 after 1-minute delay, and cut by FOB. Cord blood drawn. Placenta delivered spontaneously with gentle cord traction. Fundus firm with massage and Pitocin. Labia, perineum, vagina, and cervix inspected with 2nd degree perineal laceration, and left vaginal wall laceration, both repaired in usual fashion.  The laceration was noted to be bleeding briskly and bleeding stopped once the laceration was repaired.  Patient qualifies for postpartum hemorrhage because of her blood loss, however most of it was laceration related. Baby Weight: pending  Placenta: 3 vessel, intact. Sent to L&D Complications: None Lacerations: 2nd degree perineal laceration, and left vaginal wall laceration, both repaired in usual fashion.  EBL: 1100 mL Analgesia: None  Infant:  APGAR (1 MIN): 8   APGAR (5 MINS): 9 [place]DO OB Family Medicine Fellow, [facility], [facility]or [facility]CCo[facility] 05/20/2022, 10:58 PM

## 2022-05-05 ENCOUNTER — Encounter: Payer: Self-pay | Admitting: Advanced Practice Midwife

## 2022-05-05 ENCOUNTER — Ambulatory Visit (INDEPENDENT_AMBULATORY_CARE_PROVIDER_SITE_OTHER): Payer: Commercial Managed Care - PPO | Admitting: Advanced Practice Midwife

## 2022-05-05 VITALS — BP 119/70 | HR 87 | Wt 195.8 lb

## 2022-05-05 DIAGNOSIS — G5600 Carpal tunnel syndrome, unspecified upper limb: Secondary | ICD-10-CM

## 2022-05-05 DIAGNOSIS — Z3403 Encounter for supervision of normal first pregnancy, third trimester: Secondary | ICD-10-CM

## 2022-05-05 DIAGNOSIS — O26893 Other specified pregnancy related conditions, third trimester: Secondary | ICD-10-CM

## 2022-05-05 DIAGNOSIS — O26899 Other specified pregnancy related conditions, unspecified trimester: Secondary | ICD-10-CM

## 2022-05-05 DIAGNOSIS — Z3A38 38 weeks gestation of pregnancy: Secondary | ICD-10-CM

## 2022-05-05 NOTE — Progress Notes (Signed)
Patient presents for [place] visit. Pt reports having tingling in her fingers and also reports being short of breath more easily. No other concerns at this time.

## 2022-05-08 NOTE — Progress Notes (Signed)
   PRENATAL VISIT NOTE  Subjective:  Brittney Mays is a 37 y.o. G1P0000 at 49w5dbeing seen today for ongoing prenatal care.  She is currently monitored for the following issues for this low-risk pregnancy and has Anxiety state; Paresthesias; Myofascial pain; Family history of nonmelanoma skin cancer; Family history of genetic disease; Genetic testing; Mild scoliosis; Environmental allergies; Acid reflux; Infertility, female; AMA (advanced maternal age) multigravida 337+ Encounter for supervision of normal first pregnancy in first trimester; Placenta previa antepartum; GBS bacteriuria; Low lying placenta nos or without hemorrhage, third trimester; and Primigravida of advanced maternal age in third trimester on their problem list.  Patient reports  pain in right hand .  Contractions: Not present. Vag. Bleeding: None.  Movement: Present. Denies leaking of fluid.   The following portions of the patient's history were reviewed and updated as appropriate: allergies, current medications, past family history, past medical history, past social history, past surgical history and problem list.   Objective:   Vitals:   05/05/22 1046  BP: 119/70  Pulse: 87  Weight: 195 lb 12.8 oz (88.8 kg)    Fetal Status: Fetal Heart Rate (bpm): 142 Fundal Height: 38 cm Movement: Present  Presentation: Vertex  General:  Alert, oriented and cooperative. Patient is in no acute distress.  Skin: Skin is warm and dry. No rash noted.   Cardiovascular: Normal heart rate noted  Respiratory: Normal respiratory effort, no problems with respiration noted  Abdomen: Soft, gravid, appropriate for gestational age.  Pain/Pressure: Absent     Pelvic: Cervical exam deferred        Extremities: Normal range of motion.  Edema: Mild pitting, slight indentation  Mental Status: Normal mood and affect. Normal behavior. Normal judgment and thought content.   Assessment and Plan:  Pregnancy: G1P0000 at 337w5d. Encounter for  supervision of normal first pregnancy in third trimester --Anticipatory guidance about next visits/weeks of pregnancy given.    2. [redacted] weeks gestation of pregnancy   3. Carpal tunnel syndrome during pregnancy --Pt wearing brace, numbness/tingling in both hands but right had more severe, not relieved with brace, rest, ice  - AMB referral to orthopedics  Term labor symptoms and general obstetric precautions including but not limited to vaginal bleeding, contractions, leaking of fluid and fetal movement were reviewed in detail with the patient. Please refer to After Visit Summary for other counseling recommendations.   No follow-ups on file.  Future Appointments  Date Time Provider DeCalvary1/01/2023  8:35 AM DaLaury DeepCNM [facility] None  05/17/2022  9:35 AM EmGavin PoundCNM [facility] None    LiFatima BlankCNM

## 2022-05-09 ENCOUNTER — Encounter: Payer: Self-pay | Admitting: Sports Medicine

## 2022-05-09 ENCOUNTER — Ambulatory Visit (INDEPENDENT_AMBULATORY_CARE_PROVIDER_SITE_OTHER): Payer: Commercial Managed Care - PPO | Admitting: Sports Medicine

## 2022-05-09 DIAGNOSIS — M25531 Pain in right wrist: Secondary | ICD-10-CM

## 2022-05-09 DIAGNOSIS — G5603 Carpal tunnel syndrome, bilateral upper limbs: Secondary | ICD-10-CM | POA: Diagnosis not present

## 2022-05-09 NOTE — Progress Notes (Signed)
[redacted] weeks pregnant; bilateral carpal tunnel Wearing braces; helps some Right worse than left

## 2022-05-09 NOTE — Progress Notes (Signed)
Brittney Mays - 37 y.o. female MRN [id number]  Date of birth: [birth year]  Office Visit Note: Visit Date: 05/09/2022 PCP: Luetta Nutting, DO Referred by: Elvera Maria  Subjective: Chief Complaint  Patient presents with   Left Wrist - Pain   Right Wrist - Pain   HPI: Brittney Mays is a pleasant 37 y.o. female who presents today for bilateral wrist pain.  She is currently monitored for the following issues for this low-risk pregnancy and has Anxiety state; Paresthesias; Myofascial pain; Family history of nonmelanoma skin cancer; Family history of genetic disease; Genetic testing; Mild scoliosis; Environmental allergies; Acid reflux; Infertility, female; AMA (advanced maternal age) multigravida 81+; Encounter for supervision of normal first pregnancy in first trimester; Placenta previa antepartum; GBS bacteriuria; Low lying placenta nos or without hemorrhage, third trimester; and Primigravida of advanced maternal age in third trimester on their problem list.   Previously saw her OB/GYN on 05/05/2022 and thought to have carpal tunnel.  Respect her right is certainly worse than the left.  Having numbness and tingling extending into the thumb and sometimes the second and third digit.  She has been wearing bilateral braces as well as trying to rest them.  Not taking any medication for this.  No known injury.  Per patient, she will be [redacted]weeks gestation as of tomorrow.  Pertinent ROS were reviewed with the patient and found to be negative unless otherwise specified above in HPI.   Assessment & Plan: Visit Diagnoses:  1. Carpal tunnel syndrome, bilateral   2. Pain in right wrist    Plan: Discussed with Emiliya that she does in fact have carpal tunnel syndrome, her right is much more symptomatic than her left.  We discussed all treatment options such as soft tissue massage/release, cock-up wrist bracing, oral medications, injection therapy.  We discussed and she is aware that  her medication options are limited at this time due to her current pregnancy.  Recommend that she take Tylenol twice daily as needed as well as icing the volar wrist for 20 minutes 2-3 times a day.  She will remain in her cock-up wrist braces during the day as much as possible and nightly.  Discussed activity modification in attempt to reduce repeated flexion and extension of the wrist.  Did discuss the role for carpal tunnel injection, she is agreeable to hold off on this for now.  We discussed in most cases a few days to weeks after delivery most patient's symptoms do abate.  If she is still having symptoms 2 weeks after her delivery or if her pain is so bothersome anytime prior to her delivery she may return back and we will consider ultrasound-guided carpal tunnel injection.  Would like her to clear this with her OB/GYN as well, but there are no absolute contraindications for this modality.  Follow-up: Return if symptoms worsen or fail to improve.   Meds & Orders: No orders of the defined types were placed in this encounter.  No orders of the defined types were placed in this encounter.    Procedures: No procedures performed      Clinical History: No specialty comments available.  She reports that she has never smoked. She has never used smokeless tobacco.  Recent Labs    07/04/21 1427  HGBA1C 5.1    Objective:   Vital Signs: LMP 08/10/2021   Physical Exam  Gen: Well-appearing, in no acute distress; non-toxic CV: Regular Rate. Well-perfused. Warm.  Resp: Breathing unlabored on room air;  no wheezing. Psych: Fluid speech in conversation; appropriate affect; normal thought process Neuro: Sensation intact throughout. No gross coordination deficits.   Ortho Exam - Bilateral wrists: No bony TTP.  There is full range of motion in flexion extension of bilateral wrist.  There is more prominent volar soft tissue swelling of the hand and wrist on the right side without warmth or erythema.   Skin changes. +'s test of the carpal tunnel, right greater than left, positive Phalen's test. NVI.  Imaging: No results found.  Past Medical/Family/Surgical/Social History: Medications & Allergies reviewed per EMR, new medications updated. Patient Active Problem List   Diagnosis Date Noted   Low lying placenta nos or without hemorrhage, third trimester 04/30/2022   Primigravida of advanced maternal age in third trimester 04/30/2022   GBS bacteriuria 02/02/2022   Placenta previa antepartum 01/30/2022   AMA (advanced maternal age) multigravida 35+ 11/04/2021   Encounter for supervision of normal first pregnancy in first trimester 11/04/2021   Infertility, female 06/14/2021   Environmental allergies 02/10/2021   Acid reflux 02/10/2021   Genetic testing 07/12/2020   Family history of genetic disease 07/02/2020   Family history of nonmelanoma skin cancer    Paresthesias 04/15/2019   Myofascial pain 04/15/2019   Anxiety state 02/12/2018   Mild scoliosis 07/23/2015   Past Medical History:  Diagnosis Date   Chronic back pain    Family history of nonmelanoma skin cancer    Knee pain    Family History  Problem Relation Age of Onset   Hypertension Mother    Other Mother        acoustic neuroma   Cancer Sister        Gorlin syndrome   Leukemia Paternal Uncle 67   Depression Maternal Grandmother    Leukemia Maternal Grandmother 32   Other Maternal Grandmother        'forked ribs', ? gorlin syndrome   Diabetes Paternal Grandfather    Other Cousin        [name]   Down syndrome Cousin    Neuropathy Neg Hx    Past Surgical History:  Procedure Laterality Date   WISDOM TOOTH EXTRACTION     Social History   Occupational History   Not on file  Tobacco Use   Smoking status: Never   Smokeless tobacco: Never  Vaping Use   Vaping Use: Never used  Substance and Sexual Activity   Alcohol use: Not Currently    Alcohol/week: 2.0 standard drinks of alcohol    Types: 2  Standard drinks or equivalent per week   Drug use: Never   Sexual activity: Yes

## 2022-05-10 ENCOUNTER — Encounter: Payer: Self-pay | Admitting: Obstetrics and Gynecology

## 2022-05-10 ENCOUNTER — Telehealth: Payer: Self-pay | Admitting: Obstetrics and Gynecology

## 2022-05-10 ENCOUNTER — Ambulatory Visit (INDEPENDENT_AMBULATORY_CARE_PROVIDER_SITE_OTHER): Payer: Commercial Managed Care - PPO | Admitting: Obstetrics and Gynecology

## 2022-05-10 VITALS — BP 131/82 | HR 92 | Wt 200.6 lb

## 2022-05-10 DIAGNOSIS — O1203 Gestational edema, third trimester: Secondary | ICD-10-CM

## 2022-05-10 DIAGNOSIS — R8271 Bacteriuria: Secondary | ICD-10-CM

## 2022-05-10 DIAGNOSIS — Z3403 Encounter for supervision of normal first pregnancy, third trimester: Secondary | ICD-10-CM

## 2022-05-10 DIAGNOSIS — Z3A39 39 weeks gestation of pregnancy: Secondary | ICD-10-CM

## 2022-05-10 DIAGNOSIS — R030 Elevated blood-pressure reading, without diagnosis of hypertension: Secondary | ICD-10-CM

## 2022-05-10 LAB — CBC
Hematocrit: 32 % — ABNORMAL LOW (ref 34.0–46.6)
Hemoglobin: 10.9 g/dL — ABNORMAL LOW (ref 11.1–15.9)
MCH: 32.8 pg (ref 26.6–33.0)
MCHC: 34.1 g/dL (ref 31.5–35.7)
MCV: 96 fL (ref 79–97)
Platelets: 130 10*3/uL — ABNORMAL LOW (ref 150–450)
RBC: 3.32 x10E6/uL — ABNORMAL LOW (ref 3.77–5.28)
RDW: 12.1 % (ref 11.7–15.4)
WBC: 6.8 10*3/uL (ref 3.4–10.8)

## 2022-05-10 LAB — COMPREHENSIVE METABOLIC PANEL
ALT: 14 IU/L (ref 0–32)
AST: 20 IU/L (ref 0–40)
Albumin/Globulin Ratio: 1.5 (ref 1.2–2.2)
Albumin: 3.4 g/dL — ABNORMAL LOW (ref 3.9–4.9)
Alkaline Phosphatase: 174 IU/L — ABNORMAL HIGH (ref 44–121)
BUN/Creatinine Ratio: 8 — ABNORMAL LOW (ref 9–23)
BUN: 8 mg/dL (ref 6–20)
Bilirubin Total: 0.3 mg/dL (ref 0.0–1.2)
CO2: 22 mmol/L (ref 20–29)
Calcium: 8.6 mg/dL — ABNORMAL LOW (ref 8.7–10.2)
Chloride: 105 mmol/L (ref 96–106)
Creatinine, Ser: 0.95 mg/dL (ref 0.57–1.00)
Globulin, Total: 2.2 g/dL (ref 1.5–4.5)
Glucose: 95 mg/dL (ref 70–99)
Potassium: 3.9 mmol/L (ref 3.5–5.2)
Sodium: 139 mmol/L (ref 134–144)
Total Protein: 5.6 g/dL — ABNORMAL LOW (ref 6.0–8.5)
eGFR: 80 mL/min/{1.73_m2} (ref >59)

## 2022-05-10 LAB — PROTEIN / CREATININE RATIO, URINE
Creatinine, Urine: 20.9 mg/dL
Protein, Ur: 4.1 mg/dL
Protein/Creat Ratio: 196 mg/g creat (ref 0–200)

## 2022-05-10 NOTE — Progress Notes (Signed)
LOW-RISK PREGNANCY OFFICE VISIT Patient name: Brittney Mays MRN [id number]  Date of birth: 23-Dec-1985 Chief Complaint:   Routine Prenatal Visit  History of Present Illness:   Brittney Mays is a 37 y.o. G75P0000 female at 87w0dwith an Estimated Date of Delivery: 05/17/22 being seen today for ongoing management of a low-risk pregnancy.  Today she reports carpal tunnel symptoms and increased swelling, elevated BPs at home on Sunday after checking her weight . Her reported BPs were: 160/89, 143/78, 137/91, and 128/81. She was concerned about the weight gain and the BP, but when the BPs went down, she went to bed. She did npt want to have to go the hospital at midnight.  Contractions: Not present. Vag. Bleeding: None.  Movement: Present. denies leaking of fluid. Review of Systems:   Pertinent items are noted in HPI Denies abnormal vaginal discharge w/ itching/odor/irritation, headaches, visual changes, shortness of breath, chest pain, abdominal pain, severe nausea/vomiting, or problems with urination or bowel movements unless otherwise stated above. Pertinent History Reviewed:  Reviewed past medical,surgical, social, obstetrical and family history.  Reviewed problem list, medications and allergies. Physical Assessment:   Vitals:   05/10/22 0825 05/10/22 0845  BP: 137/81 131/82  Pulse: 100 92  Weight: 200 lb 9.6 oz (91 kg)   Body mass index is 31.42 kg/m.        Physical Examination:   General appearance: Well appearing, and in no distress  Mental status: Alert, oriented to person, place, and time  Skin: Warm & dry  Cardiovascular: Normal heart rate noted  Respiratory: Normal respiratory effort, no distress  Abdomen: Soft, gravid, nontender  Pelvic: Cervical exam performed  Dilation: Closed Effacement (%): Thick Station: -3 (well-applied)  Extremities: Edema: Trace  Fetal Status: Fetal Heart Rate (bpm): 138 Fundal Height: 44 cm Movement: Present Presentation: Vertex  No  results found for this or any previous visit (from the past 24 hour(s)).  Assessment & Plan:  1) Low-risk pregnancy G1P0000 at 358w0dith an Estimated Date of Delivery: 05/17/22   2) Encounter for supervision of normal first pregnancy in third trimester - Discussed possible IOL needed if dx'd with gHTN - Methods of IOL and expectations reviewed with patient  3) Elevated blood pressure reading in office without diagnosis of hypertension - Advised to go to [facility] for PEC w/u - Advised if d/c'd home to have BP recheck in the office on Friday 05/12/2022 - Dr. EcDione Ploverotified of recommendation. Dr. EcDione Plovertates that d/t no true dx of gHTN, he would recommend having patient return to office for stat labs instead of PEC w/u in [facility]U - Front desk attempting to contact patient since she has left the office at the time of communication with Dr. EcDione Plover- CBC - Comp Met (CMET) - Protein / creatinine ratio, urine  4) Edema during pregnancy in third trimester - CBC - Comp Met (CMET) - Protein / creatinine ratio, urine  5) GBS bacteriuria - Discussed the need for Ancef therapy while in labor  6) [redacted] weeks gestation of pregnancy     Meds: No orders of the defined types were placed in this encounter.  Labs/procedures today: pending - CBC, CMP, P/C Ratio  Plan:  Continue routine obstetrical care   Reviewed: Term labor symptoms and general obstetric precautions including but not limited to vaginal bleeding, contractions, leaking of fluid and fetal movement were reviewed in detail with the patient.  All questions were answered. Has home bp cuff.  Check bp weekly, let  us know if >140/90.   Follow-up: Return in about 2 days (around 05/12/2022) for Blood Pressure check.  No orders of the defined types were placed in this encounter.  Laury Deep MSN, CNM 05/10/2022 10:16 AM

## 2022-05-10 NOTE — Progress Notes (Signed)
Patient presents for ROB. Patient complains of having increased swelling with in the last couple days. Denies headaches and visual changes.

## 2022-05-12 ENCOUNTER — Ambulatory Visit (INDEPENDENT_AMBULATORY_CARE_PROVIDER_SITE_OTHER): Payer: Commercial Managed Care - PPO

## 2022-05-12 VITALS — BP 113/74 | HR 82 | Ht 67.0 in | Wt 201.6 lb

## 2022-05-12 DIAGNOSIS — R8271 Bacteriuria: Secondary | ICD-10-CM

## 2022-05-12 DIAGNOSIS — Z3A39 39 weeks gestation of pregnancy: Secondary | ICD-10-CM

## 2022-05-12 DIAGNOSIS — O09513 Supervision of elderly primigravida, third trimester: Secondary | ICD-10-CM

## 2022-05-12 DIAGNOSIS — O4400 Placenta previa specified as without hemorrhage, unspecified trimester: Secondary | ICD-10-CM

## 2022-05-12 NOTE — Progress Notes (Signed)
Patient presents for BP check. Pt reports having higher blood pressure readings this week and more swelling in her extremities. Pt also reports gaining more weight than she feels is normal. Pt denies headaches and visual disturbances.  BP in office today was 113/74. Pt informed to monitor her BP closely over the weekend and to report to [facility] if she has consistent high readings or any visual disturbances and(or) headaches. Pt given precautions on preeclampsia. Pt verbalized understanding and had no questions or concerns at this time.

## 2022-05-12 NOTE — Patient Instructions (Signed)
Preeclampsia and Eclampsia Preeclampsia is a serious condition that may develop during pregnancy. This condition involves high blood pressure during pregnancy and causes symptoms such as headaches, vision changes, and increased swelling in the legs, hands, and face. Preeclampsia occurs after 20 weeks of pregnancy. Eclampsia is a seizure that happens from worsening preeclampsia. Diagnosing and managing preeclampsia early is important. If not treated early, it can cause serious problems for mother and baby. There is no cure for this condition. However, during pregnancy, delivering the baby may be the best treatment for preeclampsia or eclampsia. For most women, symptoms of preeclampsia and eclampsia go away after giving birth. In rare cases, a woman may develop preeclampsia or eclampsia after giving birth. This usually occurs within 48 hours after childbirth but may occur up to 6 weeks after giving birth. What are the causes? The cause of this condition is not known. What increases the risk? The following factors make you more likely to develop preeclampsia: Being pregnant for the first time or being pregnant with multiples. Having had preeclampsia or a condition called hemolysis, elevated liver enzymes, and low platelet count (HELLP)syndrome during a past pregnancy. Having a family history of preeclampsia. Being older than age 35. Being obese. Becoming pregnant through fertility treatments. Conditions that reduce blood flow or oxygen to your placenta and baby may also increase your risk. These include: High blood pressure before, during, or immediately following pregnancy. Kidney disease. Diabetes. Blood clotting disorders. Autoimmune diseases, such as lupus. Sleep apnea. What are the signs or symptoms? Common symptoms of this condition include: A severe, throbbing headache that does not go away. Vision problems, such as blurred or double vision and light sensitivity. Pain in the stomach,  especially the right upper region. Pain in the shoulder. Other symptoms that may develop as the condition gets worse include: Sudden weight gain because of fluid buildup in the body. This causes swelling of the face, hands, legs, and feet. Severe nausea and vomiting. Urinating less than usual. Shortness of breath. Seizures. How is this diagnosed? Your health care provider will ask you about symptoms and check for signs of preeclampsia during your prenatal visits. You will also have routine tests, including: Checking your blood pressure. Urine tests to check for protein. Blood tests to assess your organ function. Monitoring your baby's heart rate. Ultrasounds to check fetal growth. How is this treated? You and your health care provider will determine the treatment that is best for you. Treatment may include: Frequent prenatal visits to check for preeclampsia. Medicine to lower your blood pressure. Medicine to prevent seizures. Low-dose aspirin during your pregnancy. Staying in the hospital, in severe cases. You will be given medicines to control your blood pressure and the amount of fluids in your body. Delivering your baby. Work with your health care provider to manage any chronic health conditions, such as diabetes or kidney problems. Also, work with your health care provider to manage weight gain during pregnancy. Follow these instructions at home: Eating and drinking Drink enough fluid to keep your urine pale yellow. Avoid caffeine. Caffeine may increase blood pressure and heart rate and lead to dehydration. Reduce the amount of salt that you eat. Lifestyle Do not use any products that contain nicotine or tobacco. These products include cigarettes, chewing tobacco, and vaping devices, such as e-cigarettes. If you need help quitting, ask your health care provider. Do not use alcohol or drugs. Avoid stress as much as possible. Rest and get plenty of sleep. General  instructions  Take   over-the-counter and prescription medicines only as told by your health care provider. When lying down, lie on your left side. This keeps pressure off your major blood vessels. When sitting or lying down, raise (elevate) your feet. Try putting pillows underneath your lower legs. Exercise regularly. Ask your health care provider what kinds of exercise are best for you. Check your blood pressure as often as recommended by your health care provider. Keep all prenatal and follow-up visits. This is important. Contact a health care provider if: You have symptoms that may need treatment or closer monitoring. These include: Headaches. Stomach pain or nausea and vomiting. Shoulder pain. Vision problems, such as spots in front of your eyes or blurry vision. Sudden weight gain or increased swelling in your face, hands, legs, and feet. Increased anxiety or feeling of impending doom. Signs or symptoms of labor. Get help right away if: You have any of the following symptoms: A seizure. Shortness of breath or trouble breathing. Trouble speaking or slurred speech. Fainting. Chest pain. These symptoms may represent a serious problem that is an emergency. Do not wait to see if the symptoms will go away. Get medical help right away. Call your local emergency services (911 in the [place] Do not drive yourself to the hospital. Summary Preeclampsia is a serious condition that may develop during pregnancy. Diagnosing and treating preeclampsia early is very important. Keep all prenatal and follow-up visits. This is important. Get help right away if you have a seizure, shortness of breath or trouble breathing, trouble speaking or slurred speech, chest pain, or fainting. This information is not intended to replace advice given to you by your health care provider. Make sure you discuss any questions you have with your health care provider. Document Revised: 01/08/2020 Document Reviewed:  01/08/2020 Elsevier Patient Education  2023 [employer]  

## 2022-05-14 NOTE — Telephone Encounter (Signed)
TC to notify of [facility] lab results -- normal no dx of PEC at this time. Advised to check BP and report any BPs >140s/90s and go to [facility]U for BPs >160s/110. Keep scheduled BP check appt on 05/12/2022. Patient verbalized an understanding of the plan of care and agrees.   Laury Deep, CNM

## 2022-05-17 ENCOUNTER — Ambulatory Visit (INDEPENDENT_AMBULATORY_CARE_PROVIDER_SITE_OTHER): Payer: Commercial Managed Care - PPO

## 2022-05-17 VITALS — BP 122/79 | HR 99 | Wt 202.4 lb

## 2022-05-17 DIAGNOSIS — Z3A40 40 weeks gestation of pregnancy: Secondary | ICD-10-CM

## 2022-05-17 DIAGNOSIS — Z3403 Encounter for supervision of normal first pregnancy, third trimester: Secondary | ICD-10-CM | POA: Diagnosis not present

## 2022-05-17 NOTE — Progress Notes (Signed)
Patient presents for [facility] visit. No concerns at this time.

## 2022-05-17 NOTE — Progress Notes (Signed)
   LOW-RISK PREGNANCY OFFICE VISIT  Patient name: Brittney Mays MRN [id number]  Date of birth: 1986/03/28 Chief Complaint:   Routine Prenatal Visit  Subjective:   Brittney Mays is a 37 y.o. G33P0000 female at 98w0dwith an Estimated Date of Delivery: 05/17/22 being seen today for ongoing management of a low-risk pregnancy aeb has Anxiety state; Paresthesias; Myofascial pain; Family history of nonmelanoma skin cancer; Family history of genetic disease; Genetic testing; Mild scoliosis; Environmental allergies; Acid reflux; Infertility, female; AMA (advanced maternal age) multigravida 311+ Encounter for supervision of normal first pregnancy in first trimester; Placenta previa antepartum; GBS bacteriuria; Low lying placenta nos or without hemorrhage, third trimester; and Primigravida of advanced maternal age in third trimester on their problem list.  Patient presents today, with SO, with no complaints.  Patient endorses fetal movement. Patient denies abdominal cramping or contractions.  Patient denies vaginal concerns including abnormal discharge, leaking of fluid, and bleeding. She does report increased discharge. No issues with urination, constipation, or diarrhea.    Contractions: Not present. Vag. Bleeding: None.  Movement: Present.  Reviewed past medical,surgical, social, obstetrical and family history as well as problem list, medications and allergies.  Objective   Vitals:   05/17/22 0940  BP: 122/79  Pulse: 99  Weight: 202 lb 6.4 oz (91.8 kg)  Body mass index is 31.7 kg/m.  Total Weight Gain:41 lb 6.4 oz (18.8 kg)         Physical Examination:   General appearance: Well appearing, and in no distress  Mental status: Alert, oriented to person, place, and time  Skin: Warm & dry  Cardiovascular: Normal heart rate noted  Respiratory: Normal respiratory effort, no distress  Abdomen: Soft, gravid, nontender, LGA with Fundal Height: 44 cm  Pelvic: Cervical exam deferred            Extremities: Edema: Mild pitting, slight indentation  Fetal Status: Fetal Heart Rate (bpm): 142  Movement: Present   No results found for this or any previous visit (from the past 24 hour(s)).  Assessment & Plan:  Low-risk pregnancy of a 37y.o., G1P0000 at 426w0dith an Estimated Date of Delivery: 05/17/22   1. Encounter for supervision of normal first pregnancy in third trimester -Anticipatory guidance for upcoming appts. -Patient to schedule next appt in 1 weeks for an in-person visit. -NST performed and reactive.   2. [redacted] weeks gestation of pregnancy -Doing well overall. -Extensive discussion regarding IOL including review of methods, hospital protocol, and antenatal testing. -Patient and SO agreeable. -Admission orders placed. -Plan to schedule for next available.  -Given information, in AVS, regarding IOL and outpatient foley bulb placement.       Meds: No orders of the defined types were placed in this encounter.  Labs/procedures today:  Lab Orders  No laboratory test(s) ordered today     Reviewed: Term labor symptoms and general obstetric precautions including but not limited to vaginal bleeding, contractions, leaking of fluid and fetal movement were reviewed in detail with the patient.  All questions were answered.  Follow-up: No follow-ups on file.  No orders of the defined types were placed in this encounter.  JeMaryann ConnersSN, CNM 05/17/2022

## 2022-05-17 NOTE — Patient Instructions (Addendum)

## 2022-05-18 ENCOUNTER — Telehealth (HOSPITAL_COMMUNITY): Payer: Self-pay | Admitting: *Deleted

## 2022-05-18 ENCOUNTER — Encounter (HOSPITAL_COMMUNITY): Payer: Self-pay

## 2022-05-18 NOTE — Telephone Encounter (Signed)
Patient called and informed that induction slot opened up on Jan 24th at 0600.  Reviewed hospital process.  Patient agreeable to this date. Induction dates changed from 1/28 to 1/24.   Maryann Conners MSN, CNM Advanced Practice Provider, [facility]

## 2022-05-18 NOTE — Telephone Encounter (Signed)
Preadmission screen  

## 2022-05-19 ENCOUNTER — Encounter (HOSPITAL_COMMUNITY): Payer: Self-pay | Admitting: *Deleted

## 2022-05-19 ENCOUNTER — Telehealth (HOSPITAL_COMMUNITY): Payer: Self-pay | Admitting: *Deleted

## 2022-05-19 NOTE — Telephone Encounter (Signed)
Preadmission screen  

## 2022-05-20 ENCOUNTER — Other Ambulatory Visit: Payer: Self-pay

## 2022-05-20 ENCOUNTER — Encounter (HOSPITAL_COMMUNITY): Payer: Self-pay | Admitting: Obstetrics and Gynecology

## 2022-05-20 ENCOUNTER — Inpatient Hospital Stay (HOSPITAL_COMMUNITY)
Admission: AD | Admit: 2022-05-20 | Discharge: 2022-05-22 | DRG: 806 | Disposition: A | Discharge status: Home / Self Care | Payer: Commercial Managed Care - PPO | Attending: Obstetrics and Gynecology | Admitting: Obstetrics and Gynecology

## 2022-05-20 DIAGNOSIS — O99344 Other mental disorders complicating childbirth: Secondary | ICD-10-CM | POA: Diagnosis present

## 2022-05-20 DIAGNOSIS — Z3A40 40 weeks gestation of pregnancy: Secondary | ICD-10-CM | POA: Diagnosis not present

## 2022-05-20 DIAGNOSIS — Z3403 Encounter for supervision of normal first pregnancy, third trimester: Secondary | ICD-10-CM

## 2022-05-20 DIAGNOSIS — O99824 Streptococcus B carrier state complicating childbirth: Secondary | ICD-10-CM | POA: Diagnosis present

## 2022-05-20 DIAGNOSIS — O9902 Anemia complicating childbirth: Secondary | ICD-10-CM | POA: Diagnosis present

## 2022-05-20 DIAGNOSIS — O4202 Full-term premature rupture of membranes, onset of labor within 24 hours of rupture: Secondary | ICD-10-CM | POA: Diagnosis not present

## 2022-05-20 DIAGNOSIS — O480 Post-term pregnancy: Secondary | ICD-10-CM | POA: Diagnosis present

## 2022-05-20 DIAGNOSIS — D62 Acute posthemorrhagic anemia: Secondary | ICD-10-CM | POA: Diagnosis not present

## 2022-05-20 DIAGNOSIS — F411 Generalized anxiety disorder: Secondary | ICD-10-CM | POA: Diagnosis present

## 2022-05-20 DIAGNOSIS — Z3401 Encounter for supervision of normal first pregnancy, first trimester: Secondary | ICD-10-CM

## 2022-05-20 DIAGNOSIS — Z7982 Long term (current) use of aspirin: Secondary | ICD-10-CM | POA: Diagnosis not present

## 2022-05-20 DIAGNOSIS — O4292 Full-term premature rupture of membranes, unspecified as to length of time between rupture and onset of labor: Principal | ICD-10-CM | POA: Diagnosis present

## 2022-05-20 DIAGNOSIS — O26893 Other specified pregnancy related conditions, third trimester: Secondary | ICD-10-CM | POA: Diagnosis present

## 2022-05-20 DIAGNOSIS — O99820 Streptococcus B carrier state complicating pregnancy: Secondary | ICD-10-CM

## 2022-05-20 DIAGNOSIS — O4400 Placenta previa specified as without hemorrhage, unspecified trimester: Secondary | ICD-10-CM

## 2022-05-20 DIAGNOSIS — Z880 Allergy status to penicillin: Secondary | ICD-10-CM

## 2022-05-20 DIAGNOSIS — R8271 Bacteriuria: Secondary | ICD-10-CM | POA: Diagnosis present

## 2022-05-20 DIAGNOSIS — O4403 Placenta previa specified as without hemorrhage, third trimester: Secondary | ICD-10-CM | POA: Diagnosis not present

## 2022-05-20 DIAGNOSIS — O09529 Supervision of elderly multigravida, unspecified trimester: Secondary | ICD-10-CM

## 2022-05-20 DIAGNOSIS — O09523 Supervision of elderly multigravida, third trimester: Secondary | ICD-10-CM | POA: Diagnosis not present

## 2022-05-20 LAB — CBC
HCT: 34.3 % — ABNORMAL LOW (ref 36.0–46.0)
Hemoglobin: 11.1 g/dL — ABNORMAL LOW (ref 12.0–15.0)
MCH: 32 pg (ref 26.0–34.0)
MCHC: 32.4 g/dL (ref 30.0–36.0)
MCV: 98.8 fL (ref 80.0–100.0)
Platelets: 146 10*3/uL — ABNORMAL LOW (ref 150–400)
RBC: 3.47 MIL/uL — ABNORMAL LOW (ref 3.87–5.11)
RDW: 12.8 % (ref 11.5–15.5)
WBC: 8.2 10*3/uL (ref 4.0–10.5)
nRBC: 0 % (ref 0.0–0.2)

## 2022-05-20 LAB — TYPE AND SCREEN
ABO/RH(D): A POS
Antibody Screen: NEGATIVE

## 2022-05-20 LAB — RPR: RPR Ser Ql: NONREACTIVE

## 2022-05-20 LAB — POCT FERN TEST: POCT Fern Test: POSITIVE

## 2022-05-20 MED ORDER — SOD CITRATE-CITRIC ACID 500-334 MG/5ML PO SOLN: 30.0000 mL | ORAL | Status: DC | PRN

## 2022-05-20 MED ORDER — TERBUTALINE SULFATE 1 MG/ML IJ SOLN: 0.2500 mg | Freq: Once | INTRAMUSCULAR | Status: DC | PRN

## 2022-05-20 MED ORDER — ACETAMINOPHEN 325 MG PO TABS: 650.0000 mg | ORAL_TABLET | ORAL | Status: DC | PRN

## 2022-05-20 MED ORDER — LIDOCAINE HCL (PF) 1 % IJ SOLN
30.0000 mL | INTRAMUSCULAR | Status: AC | PRN
  Administered 2022-05-20: 30 mL via SUBCUTANEOUS
  Filled 2022-05-20: qty 30

## 2022-05-20 MED ORDER — MISOPROSTOL 50MCG HALF TABLET: 50.0000 ug | ORAL_TABLET | Freq: Once | ORAL | Status: DC

## 2022-05-20 MED ORDER — OXYTOCIN BOLUS FROM INFUSION
333.0000 mL | Freq: Once | INTRAVENOUS | Status: AC
  Administered 2022-05-20: 333 mL via INTRAVENOUS

## 2022-05-20 MED ORDER — LIDOCAINE HCL (PF) 1 % IJ SOLN: 30.0000 mL | INTRAMUSCULAR | Status: DC | PRN

## 2022-05-20 MED ORDER — LACTATED RINGERS IV SOLN
500.0000 mL | INTRAVENOUS | Status: DC | PRN
  Administered 2022-05-20: 500 mL via INTRAVENOUS
  Administered 2022-05-21: 1000 mL via INTRAVENOUS

## 2022-05-20 MED ORDER — ONDANSETRON HCL 4 MG/2ML IJ SOLN: 4.0000 mg | Freq: Four times a day (QID) | INTRAMUSCULAR | Status: DC | PRN

## 2022-05-20 MED ORDER — CEFAZOLIN SODIUM-DEXTROSE 1-4 GM/50ML-% IV SOLN
1.0000 g | Freq: Three times a day (TID) | INTRAVENOUS | Status: DC
  Administered 2022-05-20 (×2): 1 g via INTRAVENOUS
  Filled 2022-05-20 (×3): qty 50

## 2022-05-20 MED ORDER — OXYTOCIN BOLUS FROM INFUSION
333.0000 mL | Freq: Once | INTRAVENOUS | Status: DC
  Administered 2022-05-20: 333 mL via INTRAVENOUS

## 2022-05-20 MED ORDER — LACTATED RINGERS IV SOLN: INTRAVENOUS | Status: DC

## 2022-05-20 MED ORDER — LACTATED RINGERS IV SOLN: 500.0000 mL | INTRAVENOUS | Status: DC | PRN

## 2022-05-20 MED ORDER — MISOPROSTOL 50MCG HALF TABLET
50.0000 ug | ORAL_TABLET | Freq: Once | ORAL | Status: AC
  Administered 2022-05-20: 50 ug via ORAL
  Filled 2022-05-20: qty 1

## 2022-05-20 MED ORDER — FENTANYL CITRATE (PF) 100 MCG/2ML IJ SOLN: 50.0000 ug | INTRAMUSCULAR | Status: DC | PRN

## 2022-05-20 MED ORDER — FENTANYL CITRATE (PF) 100 MCG/2ML IJ SOLN
50.0000 ug | INTRAMUSCULAR | Status: DC | PRN
  Administered 2022-05-20: 100 ug via INTRAVENOUS
  Filled 2022-05-20: qty 2

## 2022-05-20 MED ORDER — OXYCODONE-ACETAMINOPHEN 5-325 MG PO TABS: 1.0000 | ORAL_TABLET | ORAL | Status: DC | PRN

## 2022-05-20 MED ORDER — CEFAZOLIN SODIUM-DEXTROSE 2-4 GM/100ML-% IV SOLN
2.0000 g | Freq: Once | INTRAVENOUS | Status: AC
  Administered 2022-05-20: 2 g via INTRAVENOUS
  Filled 2022-05-20: qty 100

## 2022-05-20 MED ORDER — OXYTOCIN-SODIUM CHLORIDE 30-0.9 UT/500ML-% IV SOLN
2.5000 [IU]/h | INTRAVENOUS | Status: DC
  Filled 2022-05-20: qty 500

## 2022-05-20 MED ORDER — OXYCODONE-ACETAMINOPHEN 5-325 MG PO TABS: 2.0000 | ORAL_TABLET | ORAL | Status: DC | PRN

## 2022-05-20 MED ORDER — OXYTOCIN-SODIUM CHLORIDE 30-0.9 UT/500ML-% IV SOLN
2.5000 [IU]/h | INTRAVENOUS | Status: DC
  Administered 2022-05-20: 2.5 [IU]/h via INTRAVENOUS

## 2022-05-20 MED ORDER — MISOPROSTOL 25 MCG QUARTER TABLET
25.0000 ug | ORAL_TABLET | Freq: Once | ORAL | Status: AC
  Administered 2022-05-20: 25 ug via VAGINAL
  Filled 2022-05-20: qty 1

## 2022-05-20 NOTE — H&P (Signed)
OBSTETRIC ADMISSION HISTORY AND PHYSICAL  Brittney Mays is a 37 y.o. female G1P0000 with IUP at 73w3dby LMP presenting for SROM. She reports +FMs, No LOF, no VB, no blurry vision, headaches or peripheral edema, and RUQ pain.  She plans on breast feeding. She request condoms for birth control. She received her prenatal care at  [facility] --->  Estimated Date of Delivery: 05/17/22  Sono:    '@35'$  w 6 d, CWD, normal anatomy, cephalic presentation, 33532g, 95% EFW   Prenatal History/Complications:  Patient Active Problem List   Diagnosis Date Noted   Post-dates pregnancy 05/20/2022   Normal labor 05/20/2022   Low lying placenta nos or without hemorrhage, third trimester 04/30/2022   Primigravida of advanced maternal age in third trimester 04/30/2022   GBS bacteriuria 02/02/2022   Placenta previa antepartum 01/30/2022   AMA (advanced maternal age) multigravida 35+ 11/04/2021   Encounter for supervision of normal first pregnancy in first trimester 11/04/2021   Infertility, female 06/14/2021   Environmental allergies 02/10/2021   Acid reflux 02/10/2021   Genetic testing 07/12/2020   Family history of genetic disease 07/02/2020   Family history of nonmelanoma skin cancer    Paresthesias 04/15/2019   Myofascial pain 04/15/2019   Anxiety state 02/12/2018   Mild scoliosis 07/23/2015    Nursing Staff Provider  Office Location  English Dating  L[facility]  [facility] Traditional '[ ]'$  Centering '[ ]'$  Mom-Baby Dyad    Language  English Anatomy [place] 20wk: Placenta previa, anterior intramural myoma, EFW 86%  Flu Vaccine   Received at work GChief Financial OfficerScreen  NIPS:   NML female AFP:   screen neg Horizon: neg  TDaP Vaccine   02/27/2022 Hgb A1C or  GTT Early  Third trimester 2 hour wnl  COVID Vaccine    LAB RESULTS   Rhogam  N/A Blood Type A/RH(D) POSITIVE/-- (07/17 1148)   Baby Feeding Plan Breast Antibody NO ANTIBODIES DETECTED (07/17 1148)  Contraception Condoms Rubella 2.90  (07/17 1148)  Circumcision Yes  RPR Non Reactive (10/30 0920)   Pediatrician   HBsAg NON-REACTIVE (07/17 1148)   Support Person RHerbie BaltimoreHCVAb NR  Prenatal Classes  HIV Non Reactive (10/30 0920)     BTL Consent  GBS   (For PCN allergy, check sensitivities)   VBAC Consent  Pap 11/04/21       DME Rx '[ ]'$  BP cuff '[ ]'$  Weight Scale Waterbirth  '[ ]'$  Class '[ ]'$  Consent '[ ]'$  CNM visit  PHQ9 & GAD7 [ x ] new OB '[x]'$  28 weeks  [ X ] 36 weeks Induction  '[ ]'$  Orders Entered '[ ]'$ Foley Y/N    Past Medical History: Past Medical History:  Diagnosis Date   Chronic back pain    Family history of nonmelanoma skin cancer    Knee pain     Past Surgical History: Past Surgical History:  Procedure Laterality Date   WISDOM TOOTH EXTRACTION      Obstetrical History: OB History     Gravida  1   Para  0   Term  0   Preterm  0   AB  0   Living  0      SAB  0   IAB  0   Ectopic  0   Multiple  0   Live Births  0           Social History Social History   Socioeconomic  History   Marital status: Married    Spouse name: Not on file   Number of children: 0   Years of education: Not on file   Highest education level: Bachelor's degree (e.g., BA, AB, BS)  Occupational History   Not on file  Tobacco Use   Smoking status: Never   Smokeless tobacco: Never  Vaping Use   Vaping Use: Never used  Substance and Sexual Activity   Alcohol use: Not Currently    Alcohol/week: 2.0 standard drinks of alcohol    Types: 2 Standard drinks or equivalent per week   Drug use: Never   Sexual activity: Yes  Other Topics Concern   Not on file  Social History Narrative   Lives at home with husband   Right handed   Caffeine: 1 cup/day   Social Determinants of Health   Financial Resource Strain: Not on file  Food Insecurity: No Food Insecurity (05/20/2022)   Hunger Vital Sign    Worried About Running Out of Food in the Last Year: Never true    Ran Out of Food in the Last Year: Never true   Transportation Needs: No Transportation Needs (05/20/2022)   PRAPARE - Hydrologist (Medical): No    Lack of Transportation (Non-Medical): No  Physical Activity: Not on file  Stress: Not on file  Social Connections: Not on file    Family History: Family History  Problem Relation Age of Onset   Hypertension Mother    Other Mother        acoustic neuroma   Cancer Sister        Gorlin syndrome   Leukemia Paternal Uncle 40   Depression Maternal Grandmother    Leukemia Maternal Grandmother 66   Other Maternal Grandmother        'forked ribs', ? gorlin syndrome   Diabetes Paternal Grandfather    Other Cousin        [name]   Down syndrome Cousin    Neuropathy Neg Hx     Allergies: Allergies  Allergen Reactions   Amoxicillin Hives    Medications Prior to Admission  Medication Sig Dispense Refill Last Dose   ASHWAGANDHA PO Take by mouth.   Past Month   aspirin EC 81 MG tablet Take 1 tablet (81 mg total) by mouth daily. Swallow whole. 30 tablet 12 05/19/2022 at 0800   calcium carbonate (TUMS - DOSED IN MG ELEMENTAL CALCIUM) 500 MG chewable tablet Chew 1 tablet by mouth daily.   Past Month   cetirizine (ZYRTEC) 10 MG tablet Take 10 mg by mouth daily.   Past Week   omeprazole (PRILOSEC) 20 MG capsule Take 20 mg by mouth daily.   05/19/2022   Prenatal Vit-Fe Fumarate-FA (MULTIVITAMIN-PRENATAL) 27-0.8 MG TABS tablet Take 1 tablet by mouth daily at 12 noon.   05/19/2022   Elastic Bandages & Supports (COMFORT FIT MATERNITY SUPP MED) MISC Wear daily when ambulating 1 each 0      Review of Systems   All systems reviewed and negative except as stated in HPI  Blood pressure 127/87, pulse 80, temperature 98 F (36.7 C), temperature source Oral, resp. rate 16, height '5\' 7"'$  (1.702 m), weight 93.2 kg, last menstrual period 08/10/2021, SpO2 100 %. General appearance: alert, cooperative, appears stated age, and mild distress Lungs: clear to auscultation  bilaterally Heart: regular rate and rhythm Abdomen: soft, non-tender; bowel sounds normal Pelvic: no lesions Extremities: Homans sign is negative, no sign of DVT  Presentation: cephalic Fetal monitoringBaseline: 150 bpm, Variability: Good {> 6 bpm), Accelerations: Reactive, and Decelerations: Absent Uterine activityFrequency: irritability     Prenatal labs: ABO, Rh: --/--/A POS (01/20 0216) Antibody: NEG (01/20 0216) Rubella: 2.90 (07/17 1148) RPR: Non Reactive (10/30 0920)  HBsAg: NON-REACTIVE (07/17 1148)  HIV: Non Reactive (10/30 0920)  GBS:    1 hr Glucola nml Genetic screening  nml Anatomy US placental previa, resolved  Prenatal Transfer Tool  Maternal Diabetes: No Genetic Screening: Normal Maternal Ultrasounds/Referrals: Normal and Other: Placenta previa Fetal Ultrasounds or other Referrals:  None Maternal Substance Abuse:  No Significant Maternal Medications:  None Significant Maternal Lab Results: GBS positive Number of Prenatal Visits:greater than 3 verified prenatal visits Other Comments:  None  Results for orders placed or performed during the hospital encounter of 05/20/22 (from the past 24 hour(s))  Fern Test   Collection Time: 05/20/22  1:35 AM  Result Value Ref Range   POCT Fern Test Positive = ruptured amniotic membanes   Type and screen   Collection Time: 05/20/22  2:16 AM  Result Value Ref Range   ABO/RH(D) A POS    Antibody Screen NEG    Sample Expiration      05/23/2022,2359 Performed at [facility], [address], [place], [place] [address]   CBC   Collection Time: 05/20/22  2:21 AM  Result Value Ref Range   WBC 8.2 4.0 - 10.5 K/uL   RBC 3.47 (L) 3.87 - 5.11 MIL/uL   Hemoglobin 11.1 (L) 12.0 - 15.0 g/dL   HCT 34.3 (L) 36.0 - 46.0 %   MCV 98.8 80.0 - 100.0 fL   MCH 32.0 26.0 - 34.0 pg   MCHC 32.4 30.0 - 36.0 g/dL   RDW 12.8 11.5 - 15.5 %   Platelets 146 (L) 150 - 400 K/uL   nRBC 0.0 0.0 - 0.2 %    Patient Active Problem List    Diagnosis Date Noted   Post-dates pregnancy 05/20/2022   Normal labor 05/20/2022   Low lying placenta nos or without hemorrhage, third trimester 04/30/2022   Primigravida of advanced maternal age in third trimester 04/30/2022   GBS bacteriuria 02/02/2022   Placenta previa antepartum 01/30/2022   AMA (advanced maternal age) multigravida 35+ 11/04/2021   Encounter for supervision of normal first pregnancy in first trimester 11/04/2021   Infertility, female 06/14/2021   Environmental allergies 02/10/2021   Acid reflux 02/10/2021   Genetic testing 07/12/2020   Family history of genetic disease 07/02/2020   Family history of nonmelanoma skin cancer    Paresthesias 04/15/2019   Myofascial pain 04/15/2019   Anxiety state 02/12/2018   Mild scoliosis 07/23/2015    Assessment/Plan:  JEMIMAH CRESSY is a 37 y.o. G1P0000 at 81w3dhere for SROM  #Labor:expectant management, anticipate SVD #Pain: IV pain meds, epidural upon request #FWB: CAT 1 #ID:  GBS pos- Ancef #MOF: Breast #MOC:condoms #Circ:  yes  JShelda Pal DO  05/20/2022, 5:19 AM

## 2022-05-20 NOTE — Progress Notes (Signed)
Brittney Mays is a 37 y.o. G1P0000 at [redacted]w[redacted]d admitted for PROM  Subjective: Patient had ROM around MN. She states that prior to ROM she wasn't really having any contractions. Since ROM she has noticed some more contractions. She rates then 3/10 on the pain scale.   Objective: BP 137/89 (BP Location: Right Arm)   Pulse 89   Temp 98.3 F (36.8 C) (Oral)   Resp 19   Ht '5\' 7"'$  (1.702 m)   Wt 93.2 kg   LMP 08/10/2021   SpO2 100%   BMI 32.19 kg/m  No intake/output data recorded. No intake/output data recorded.  FHT:  FHR: 130 bpm, variability: moderate,  accelerations:  Present,  decelerations:  Absent UC:   not on toco currently, approx every 5-10+ mins  SVE:   Dilation: 1 Effacement (%): 50 Station: -3 Exam by:: HNorman Herrlich CNM  Labs: Lab Results  Component Value Date   WBC 8.2 05/20/2022   HGB 11.1 (L) 05/20/2022   HCT 34.3 (L) 05/20/2022   MCV 98.8 05/20/2022   PLT 146 (L) 05/20/2022    Assessment / Plan: Patient with PROM and not in labor at this time, approx 10 hours post ROM DW patient that we could continue with expectant management, try nipple stimulation, place a foley balloon or start cytotec. Discussed these options and answered questions. Patient would like to try nipple stim at this time. Also advised patient that with nipple stimulatin I would still recommend keeping baby on the monitor as it is still an intervention. She is agreeable.  Labor:  not in labor  Preeclampsia:   NA Fetal Wellbeing:   so far on IA FHR has been stable. Will put on the monitor while we do nipple stim.  Pain Control:  Labor support without medications I/D:  n/a Anticipated MOD:  NSVD  Patient will start nipple stimulation with hand pump. Recommended 5-15 mins q hour.    HMarcille BuffyDNP, CNM  05/20/22  10:57 AM

## 2022-05-20 NOTE — Discharge Summary (Signed)
Postpartum Discharge Summary    Patient Name: Brittney Mays DOB: Oct 10, 1985 MRN: [id number]  Date of admission: 05/20/2022 Delivery date:05/20/2022  Delivering provider: Shelda Pal  Date of discharge: 05/22/2022  Admitting diagnosis: Post-dates pregnancy [O48.0] Normal labor [O80, Z37.9] Intrauterine pregnancy: [redacted]w[redacted]d    Secondary diagnosis:  Principal Problem:   Vaginal delivery Active Problems:   Anxiety state   AMA (advanced maternal age) multigravida 334+  Encounter for supervision of normal first pregnancy in first trimester   GBS bacteriuria   Post-dates pregnancy   Postpartum hemorrhage   Acute blood loss anemia  Additional problems: none    Discharge diagnosis: Term Pregnancy Delivered                                              Post partum procedures: IV Venofer '500mg'$  X 1 dose Augmentation: misoprostol, oxytocin Complications: [id number] Hospital course: Induction of Labor With Vaginal Delivery   37y.o. yo G1P1001 at 451w3das admitted to the hospital 05/20/2022 for induction of labor.  Indication for induction: PROM.  Patient had an labor course complicated by post partum hemorrhage, due to a perineal laceration. Membrane Rupture Time/Date: 12:15 AM ,05/20/2022   Delivery Method:Vaginal, Spontaneous  Episiotomy:  no Lacerations:  2nd degree;Vaginal  Details of delivery can be found in separate delivery note.  Patient had a postpartum course complicated by acute blood loss anemia, for which she received 1 dose of IV venofer. Patient is discharged home 05/22/22.  Newborn Data: Birth date:05/20/2022  Birth time:10:02 PM  Gender:Female  Living status:Living  Apgars:8 ,9  Weight:4120 g   Magnesium Sulfate received: No BMZ received: No Rhophylac:N/A MMR:N/A T-DaP:Given prenatally Flu: Yes Transfusion:No  Physical exam  Vitals:   05/21/22 1509 05/21/22 2023 05/22/22 0547 05/22/22 0943  BP: 132/78 106/64 110/79 106/70  Pulse: (!) 109  (!) 102 86 99  Resp: '18 18 18 18  '$ Temp: 97.8 F (36.6 C) 98.8 F (37.1 C) 98.3 F (36.8 C) 98 F (36.7 C)  TempSrc: Oral Oral Oral Oral  SpO2: 99%   100%  Weight:      Height:       General: alert, cooperative, and no distress Lochia: appropriate Uterine Fundus: firm Incision: N/A DVT Evaluation: No evidence of DVT seen on physical exam. Labs: Lab Results  Component Value Date   WBC 18.3 (H) 05/21/2022   HGB 8.4 (L) 05/21/2022   HCT 24.8 (L) 05/21/2022   MCV 96.1 05/21/2022   PLT 130 (L) 05/21/2022      Latest Ref Rng & Units 05/10/2022   11:21 AM  CMP  Glucose 70 - 99 mg/dL 95   BUN 6 - 20 mg/dL 8   Creatinine 0.57 - 1.00 mg/dL 0.95   Sodium 134 - 144 mmol/L 139   Potassium 3.5 - 5.2 mmol/L 3.9   Chloride 96 - 106 mmol/L 105   CO2 20 - 29 mmol/L 22   Calcium 8.7 - 10.2 mg/dL 8.6   Total Protein 6.0 - 8.5 g/dL 5.6   Total Bilirubin 0.0 - 1.2 mg/dL 0.3   Alkaline Phos 44 - 121 IU/L 174   AST 0 - 40 IU/L 20   ALT 0 - 32 IU/L 14    Edinburgh Score:    05/21/2022   10:48 AM  Edinburgh Postnatal Depression Scale Screening Tool  I have been  able to laugh and see the funny side of things. 0  I have looked forward with enjoyment to things. 0  I have blamed myself unnecessarily when things went wrong. 2  I have been anxious or worried for no good reason. 1  I have felt scared or panicky for no good reason. 0  Things have been getting on top of me. 1  I have been so unhappy that I have had difficulty sleeping. 0  I have felt sad or miserable. 1  I have been so unhappy that I have been crying. 1  The thought of harming myself has occurred to me. 0  Edinburgh Postnatal Depression Scale Total 6     After visit meds:  Allergies as of 05/22/2022       Reactions   Amoxicillin Hives        Medication List     STOP taking these medications    ASHWAGANDHA PO   aspirin EC 81 MG tablet   calcium carbonate 500 MG chewable tablet Commonly known as: TUMS - dosed  in mg elemental calcium   Comfort Fit Maternity Supp Med Misc   omeprazole 20 MG capsule Commonly known as: PRILOSEC       TAKE these medications    cetirizine 10 MG tablet Commonly known as: ZYRTEC Take 10 mg by mouth daily.   ferrous sulfate 325 (65 FE) MG tablet Take 1 tablet (325 mg total) by mouth every other day.   multivitamin-prenatal 27-0.8 MG Tabs tablet Take 1 tablet by mouth daily at 12 noon.   polyethylene glycol powder 17 GM/SCOOP powder Commonly known as: GLYCOLAX/MIRALAX Take 17 g by mouth daily as needed for mild constipation. Dilute in 8oz of orange juice         Discharge home in stable condition Infant Feeding: Breast Infant Disposition:home with mother Discharge instruction: per After Visit Summary and Postpartum booklet. Activity: Advance as tolerated. Pelvic rest for 6 weeks.  Diet: routine diet Future Appointments: Future Appointments  Date Time Provider [place]  07/10/2022  9:15 AM Leftwich-Kirby, Kathie Dike, CNM [facility] None   Follow up Visit: Message sent to [facility]  Please schedule this patient for a In person postpartum visit in 6 weeks with the following provider: Any provider. Additional Postpartum F/U: N/A   Low risk pregnancy complicated by:  AMA, anxiety Delivery mode:  Vaginal, Spontaneous  Anticipated Birth Control:  Condoms   Liliane Channel MD MPH OB Fellow, [place] for [place] 05/22/2022

## 2022-05-20 NOTE — MAU Note (Addendum)
Pt says SROM  at [place]- clear then pink Some UC's  PNC- [facility]am[facility]  VE  at 39 weeks - closed Denies HSV GBS- positive  Is sch for an Induction 05-24-2022

## 2022-05-20 NOTE — Progress Notes (Signed)
Brittney Mays is a 37 y.o. G1P0000 at 20w3dadmitted for PROM  Subjective: Patient has tried nipple stimulation. She does not feel like this lead to any change in her contraction pattern. She is interested in seeing about her options for something else to try at this time.   Objective: BP 116/80 (BP Location: Left Arm)   Pulse 86   Temp 98.1 F (36.7 C) (Oral)   Resp 17   Ht '5\' 7"'$  (1.702 m)   Wt 93.2 kg   LMP 08/10/2021   SpO2 100%   BMI 32.19 kg/m  No intake/output data recorded. No intake/output data recorded.  FHT:  FHR: 135 bpm, variability: moderate,  accelerations:  Present,  decelerations:  Absent UC:   irregular, every 3-7 minutes SVE:   Dilation: 1 Effacement (%): 50 Station: -3 Exam by:: HNorman Herrlich CNM  Labs: Lab Results  Component Value Date   WBC 8.2 05/20/2022   HGB 11.1 (L) 05/20/2022   HCT 34.3 (L) 05/20/2022   MCV 98.8 05/20/2022   PLT 146 (L) 05/20/2022    Assessment / Plan: PROM X 14 hours and no labor has started as of this time.   Labor:  Not in labor  Preeclampsia:   NA Fetal Wellbeing:  Category I Pain Control:  Labor support without medications I/D:  n/a Anticipated MOD:  NSVD  DW patient options that include cytotec or FB insertion. I did tell her I thought it would be a challenging FB insertion based on her exam from earlier today, but that it was an option that we could try. I also discussed starting with cytotec. I answered her questions. She would like to start with cytotec at this time and then maybe once more dilated we could try FB placement. Will start with initial dose of cytotec of 565m PO/2520mVaginal      HeaMarcille BuffyP, CNM  05/20/22  2:17 PM

## 2022-05-21 LAB — CBC
HCT: 24.8 % — ABNORMAL LOW (ref 36.0–46.0)
Hemoglobin: 8.4 g/dL — ABNORMAL LOW (ref 12.0–15.0)
MCH: 32.6 pg (ref 26.0–34.0)
MCHC: 33.9 g/dL (ref 30.0–36.0)
MCV: 96.1 fL (ref 80.0–100.0)
Platelets: 130 10*3/uL — ABNORMAL LOW (ref 150–400)
RBC: 2.58 MIL/uL — ABNORMAL LOW (ref 3.87–5.11)
RDW: 12.8 % (ref 11.5–15.5)
WBC: 18.3 10*3/uL — ABNORMAL HIGH (ref 4.0–10.5)
nRBC: 0 % (ref 0.0–0.2)

## 2022-05-21 MED ORDER — BENZOCAINE-MENTHOL 20-0.5 % EX AERO
1.0000 | INHALATION_SPRAY | CUTANEOUS | Status: DC | PRN
  Administered 2022-05-21: 1 via TOPICAL
  Filled 2022-05-21: qty 56

## 2022-05-21 MED ORDER — DIPHENHYDRAMINE HCL 25 MG PO CAPS: 25.0000 mg | ORAL_CAPSULE | Freq: Four times a day (QID) | ORAL | Status: DC | PRN

## 2022-05-21 MED ORDER — SODIUM CHLORIDE 0.9% FLUSH: 3.0000 mL | INTRAVENOUS | Status: DC | PRN

## 2022-05-21 MED ORDER — ONDANSETRON HCL 4 MG PO TABS: 4.0000 mg | ORAL_TABLET | ORAL | Status: DC | PRN

## 2022-05-21 MED ORDER — COCONUT OIL OIL: 1.0000 | TOPICAL_OIL | Status: DC | PRN

## 2022-05-21 MED ORDER — SODIUM CHLORIDE 0.9 % IV SOLN
500.0000 mg | Freq: Once | INTRAVENOUS | Status: AC
  Administered 2022-05-21: 500 mg via INTRAVENOUS
  Filled 2022-05-21: qty 500

## 2022-05-21 MED ORDER — PRENATAL MULTIVITAMIN CH
1.0000 | ORAL_TABLET | Freq: Every day | ORAL | Status: DC
  Administered 2022-05-21 – 2022-05-22 (×2): 1 via ORAL
  Filled 2022-05-21 (×2): qty 1

## 2022-05-21 MED ORDER — ONDANSETRON HCL 4 MG/2ML IJ SOLN: 4.0000 mg | INTRAMUSCULAR | Status: DC | PRN

## 2022-05-21 MED ORDER — AMMONIA AROMATIC IN INHA
RESPIRATORY_TRACT | Status: AC
  Filled 2022-05-21: qty 10

## 2022-05-21 MED ORDER — WITCH HAZEL-GLYCERIN EX PADS: 1.0000 | MEDICATED_PAD | CUTANEOUS | Status: DC | PRN

## 2022-05-21 MED ORDER — ACETAMINOPHEN 325 MG PO TABS
650.0000 mg | ORAL_TABLET | ORAL | Status: DC | PRN
  Administered 2022-05-21 – 2022-05-22 (×4): 650 mg via ORAL
  Filled 2022-05-21 (×4): qty 2

## 2022-05-21 MED ORDER — DIBUCAINE (PERIANAL) 1 % EX OINT: 1.0000 | TOPICAL_OINTMENT | CUTANEOUS | Status: DC | PRN

## 2022-05-21 MED ORDER — IBUPROFEN 600 MG PO TABS
600.0000 mg | ORAL_TABLET | Freq: Four times a day (QID) | ORAL | Status: DC
  Administered 2022-05-21 – 2022-05-22 (×7): 600 mg via ORAL
  Filled 2022-05-21 (×7): qty 1

## 2022-05-21 MED ORDER — SODIUM CHLORIDE 0.9 % IV SOLN: 250.0000 mL | INTRAVENOUS | Status: DC | PRN

## 2022-05-21 MED ORDER — SODIUM CHLORIDE 0.9% FLUSH
3.0000 mL | Freq: Two times a day (BID) | INTRAVENOUS | Status: DC
  Administered 2022-05-21 – 2022-05-22 (×2): 3 mL via INTRAVENOUS

## 2022-05-21 MED ORDER — SIMETHICONE 80 MG PO CHEW: 80.0000 mg | CHEWABLE_TABLET | ORAL | Status: DC | PRN

## 2022-05-21 MED ORDER — SENNOSIDES-DOCUSATE SODIUM 8.6-50 MG PO TABS
2.0000 | ORAL_TABLET | ORAL | Status: DC
  Administered 2022-05-21 – 2022-05-22 (×2): 2 via ORAL
  Filled 2022-05-21 (×3): qty 2

## 2022-05-21 MED ORDER — ZOLPIDEM TARTRATE 5 MG PO TABS: 5.0000 mg | ORAL_TABLET | Freq: Every evening | ORAL | Status: DC | PRN

## 2022-05-21 NOTE — Lactation Note (Addendum)
This note was copied from a baby's chart. Lactation Consultation Note  Patient Name: Brittney Mays GYBWL'S Date: 05/21/2022 Age : 36 hours old,  Reason for consult: Initial assessment;1st time breastfeeding;Primapara;Term;Breastfeeding assistance Mom called for LC assist.  As LC entered the room baby latched shallow with a #24 NS .  Per mom the RN provided the NS last night.  LC assessed breast tissue and recommended trying to latch without the NS. Areolo slight edema, hand expressing and reverse pressure made the areola more compressible.  Baby opened wide and with breast compressions suckled for 4 mins with a few swallows and off.  LC offered to change his wet diaper , and attempted to relatch on the right breast / football and baby didn't act hungry, STS with mom.  While LC was changing the baby , large clear to mucous spit, bulb x 2.  LC reviewed burping , use of the bulb syringe and to call on the nurses light if the baby was struggling with spitting.  LC discuss with parents the 24 hours goals for breast feeding to feed with cues and by 3 hours STS, due to the baby being spitty , burp before feedings , in between and after to move the gas down.  Per mom was given a hand pump in labor.  LC washed the hand pump, and at the next consult the flange needs to be checked . Mom fell asleep , dad holding baby wrapped in blankets.  Latch score 7.    Maternal Data - per mom several breast  changes with pregnancy  Has patient been taught Hand Expression?: Yes Does the patient have breastfeeding experience prior to this delivery?: No  Feeding Mother's Current Feeding Choice: Breast Milk  LATCH Score Latch: Repeated attempts needed to sustain latch, nipple held in mouth throughout feeding, stimulation needed to elicit sucking reflex.  Audible Swallowing: A few with stimulation  Type of Nipple: Everted at rest and after stimulation (reverse pressure due areola edema)  Comfort  (Breast/Nipple): Soft / non-tender  Hold (Positioning): Assistance needed to correctly position infant at breast and maintain latch.  LATCH Score: 7   Lactation Tools Discussed/Used Tools: Pump;Other (comment) (per mom was usin g the hand  pump in labor) Breast pump type: Manual Pump Education: Milk Storage  Interventions Interventions: Breast feeding basics reviewed;Assisted with latch;Skin to skin;Breast massage;Hand express;Reverse pressure;Breast compression;Adjust position;Support pillows;Position options;Hand pump  Discharge Pump: DEBP;Personal  Consult Status Consult Status: Follow-up Date: 05/21/22 Follow-up type: In-patient    [place] 05/21/2022, 9:37 AM

## 2022-05-21 NOTE — Progress Notes (Signed)
Post Partum Day 1 Subjective: no complaints, up ad lib, and tolerating PO  Objective: Blood pressure 123/81, pulse (!) 109, temperature 98 F (36.7 C), temperature source Oral, resp. rate 16, height '5\' 7"'$  (1.702 m), weight 93.2 kg, last menstrual period 08/10/2021, SpO2 99 %, unknown if currently breastfeeding.  Physical Exam:  General: alert, cooperative, and no distress Lochia: appropriate Uterine Fundus: firm DVT Evaluation: No evidence of DVT seen on physical exam.  Recent Labs    05/20/22 0221 05/21/22 0442  HGB 11.1* 8.4*  HCT 34.3* 24.8*    Assessment/Plan: Breastfeeding and Contraception condoms Anemia PP, add Venofer for today Circumcision consented  LOS: 1 day   Emeterio Reeve, MD 05/21/2022, 9:18 AM

## 2022-05-21 NOTE — Progress Notes (Signed)
Circumcision Consent  Discussed with mom at bedside about circumcision.   Circumcision is a surgery that removes the skin that covers the tip of the penis, called the "foreskin." Circumcision is usually done when a boy is between 41 and 36 days old, sometimes up to 17-47 weeks old.  The most common reasons boys are circumcised include for cultural/religious beliefs or for parental preference (potentially easier to clean, so baby looks like daddy, etc).  There may be some medical benefits for circumcision:   Circumcised boys seem to have slightly lower rates of: ? Urinary tract infections (per the American Academy of Pediatrics an uncircumcised boy has a 1/100 chance of developing a UTI in the first year of life, a circumcised boy at a 05/998 chance of developing a UTI in the first year of life- a 10% reduction) ? Penis cancer (typically rare- an uncircumcised female has a 1 in 100,000 chance of developing cancer of the penis) ? Sexually transmitted infection (in endemic areas, including HIV, HPV and Herpes- circumcision does NOT protect against gonorrhea, chlamydia, trachomatis, or syphilis) ? Phimosis: a condition where that makes retraction of the foreskin over the glans impossible (0.4 per 1000 boys per year or 0.6% of boys are affected by their 15th birthday)  Boys and men who are not circumcised can reduce these extra risks by: ? Cleaning their penis well ? Using condoms during sex  What are the risks of circumcision?  As with any surgical procedure, there are risks and complications. In circumcision, complications are rare and usually minor, the most common being: ? Bleeding- risk is reduced by holding each clamp for 30 seconds prior to a cut being made, and by holding pressure after the procedure is done ? Infection- the penis is cleaned prior to the procedure, and the procedure is done under sterile technique ? Damage to the urethra or amputation of the penis  How is circumcision done  in baby boys?  The baby will be placed on a special table and the legs restrained for their safety. Numbing medication is injected into the penis, and the skin is cleansed with betadine to decrease the risk of infection.   What to expect:  The penis will look red and raw for 5-7 days as it heals. We expect scabbing around where the cut was made, as well as clear-pink fluid and some swelling of the penis right after the procedure. If your baby's circumcision starts to bleed or develops pus, please contact your pediatrician immediately.  All questions were answered and mother consented.  Emeterio Reeve Obstetrics FellowPatient ID: Brittney Mays, female   DOB: 12/17/1985, 37 y.o.   MRN: [id number]

## 2022-05-21 NOTE — Progress Notes (Signed)
CSW received consult for hx of Anxiety. CSW met with MOB to offer support and complete assessment. When CSW entered room, MOB was observed sitting in hospital bed holding infant "Alec." FOB was present. CSW introduced self and requested privacy. MOB provided verbal consent to complete consult with FOB present. CSW explained reason for consult. MOB presented as calm, was agreeable to consult and remained engaged throughout encounter.   CSW how MOB has felt emotionally since delivery. MOB shares she is feeling tired, sharing that she had a difficult birth which started slow and then progressed "too hard, too fast." MOB shared she was unable to have an epidural and the experience was difficult for both herself and FOB. CSW provided active listening and validated MOB's experience.   CSW inquired about MOB's mental health history. MOB states she was diagnosed with anxiety about 5 years ago. MOB is not current on psychotropic medication and does not attend therapy. Per chart review, MOB met with [facility] therapist at [facility] on two occasions during pregnancy. MOB declined additional outpatient mental health resources at this time. MOB identified FOB, her mother, and coworkers as supports. MOB denied current SI/HI.  CSW provided education regarding the baby blues period vs. perinatal mood disorders, discussed treatment and gave resources for mental health follow up if concerns arise.  CSW recommends self-evaluation during the postpartum time period using the New Mom Checklist from Postpartum Progress and encouraged MOB to contact a medical professional if symptoms are noted at any time.    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   MOB reports she has all needed items for infant, including a car seat and bassinet. MOB has chosen [facility] in [place] for infant's follow up care. MOB inquired about a nurse home visiting program. [place] provided information about [facility] [place]. MOB requested a referral, which CSW placed. MOB denied additional resource needs at this time.  CSW identifies no further need for intervention and no barriers to discharge at this time.  Signed,  Berniece Salines, MSW, [place], [place] 05/21/2022 3:36 PM

## 2022-05-22 DIAGNOSIS — D62 Acute posthemorrhagic anemia: Secondary | ICD-10-CM | POA: Insufficient documentation

## 2022-05-22 MED ORDER — POLYETHYLENE GLYCOL 3350 17 GM/SCOOP PO POWD: 17.0000 g | Freq: Every day | ORAL | 0 refills | Status: DC | PRN

## 2022-05-22 MED ORDER — FERROUS SULFATE 325 (65 FE) MG PO TABS: 325.0000 mg | ORAL_TABLET | ORAL | 0 refills | Status: DC

## 2022-05-22 NOTE — Lactation Note (Signed)
This note was copied from a baby's chart. Lactation Consultation Note  Patient Name: Brittney Mays [id number] Date: 05/22/2022 Reason for consult: Follow-up assessment;1st time breastfeeding Age:37 hours  P1, Baby was circumcised today and has been sleepy. Woke baby for feeding.  Mother hand expressed drops prior to latching.  Baby latched after one attempt and sustained latch for more than 25 min. with intermittent swallows. Discussed cluster feeding, frequency, pumping if baby does not latch, calling for assistance or making an outpatient appt. If needed.  Reviewed engorgement care and monitoring voids/stools.   Maternal Data Has patient been taught Hand Expression?: Yes Does the patient have breastfeeding experience prior to this delivery?: No  Feeding Mother's Current Feeding Choice: Breast Milk  LATCH Score Latch: Grasps breast easily, tongue down, lips flanged, rhythmical sucking.  Audible Swallowing: A few with stimulation  Type of Nipple: Everted at rest and after stimulation  Comfort (Breast/Nipple): Soft / non-tender  Hold (Positioning): Assistance needed to correctly position infant at breast and maintain latch.  LATCH Score: 8   Lactation Tools Discussed/Used  DEBP  Interventions Interventions: Breast feeding basics reviewed;Assisted with latch;Skin to skin;Hand express;Education  Discharge Discharge Education: Engorgement and breast care;Warning signs for feeding baby;Outpatient recommendation Pump: Personal;DEBP  Consult Status Consult Status: Complete Date: 05/23/22    Vivianne Master [facility] 05/22/2022, 2:12 PM

## 2022-05-24 ENCOUNTER — Inpatient Hospital Stay (HOSPITAL_COMMUNITY)
Admission: RE | Admit: 2022-05-24 | Payer: Commercial Managed Care - PPO | Source: Home / Self Care | Admitting: Obstetrics and Gynecology

## 2022-05-24 ENCOUNTER — Inpatient Hospital Stay (HOSPITAL_COMMUNITY): Payer: Commercial Managed Care - PPO

## 2022-05-25 ENCOUNTER — Ambulatory Visit (INDEPENDENT_AMBULATORY_CARE_PROVIDER_SITE_OTHER): Payer: Commercial Managed Care - PPO | Admitting: Medical-Surgical

## 2022-05-25 ENCOUNTER — Encounter: Payer: Self-pay | Admitting: Medical-Surgical

## 2022-05-25 VITALS — BP 112/69 | HR 96 | Resp 20 | Ht 67.0 in | Wt 187.3 lb

## 2022-05-25 DIAGNOSIS — H6690 Otitis media, unspecified, unspecified ear: Secondary | ICD-10-CM | POA: Diagnosis not present

## 2022-05-25 DIAGNOSIS — I809 Phlebitis and thrombophlebitis of unspecified site: Secondary | ICD-10-CM | POA: Diagnosis not present

## 2022-05-25 MED ORDER — CEFDINIR 300 MG PO CAPS: 300.0000 mg | ORAL_CAPSULE | Freq: Two times a day (BID) | ORAL | 0 refills | Status: DC

## 2022-05-25 NOTE — Progress Notes (Signed)
Established Patient Office Visit  Subjective   Patient ID: Brittney Mays, female   DOB: 1985/08/31 Age: 37 y.o. MRN: [id number]   Chief Complaint  Patient presents with   Ear Pain    HPI Pleasant 37 year old female presenting today for evaluation of left ear pain.  Notes that approximately a week ago, she developed a feeling of her ear being clogged up with something on the left side.  Over the last several days, this has progressed to left ear pain along with left-sided throat pain.  She is 5 days postpartum and has not had this evaluated since she had labor and delivery impending.  Denies fever, chills, unusual sinus congestion, headache, and GI symptoms.  Feels that she has had drainage from the left ear but no bleeding.  Of note, over the last week and a half, she has developed redness, tenderness, and swelling at the site of her previous IV.   Objective:    Vitals:   05/25/22 1548  BP: 112/69  Pulse: 96  Resp: 20  Height: '5\' 7"'$  (1.702 m)  Weight: 187 lb 4.8 oz (85 kg)  SpO2: 99%  BMI (Calculated): 29.33    Physical Exam Vitals and nursing note reviewed.  Constitutional:      General: She is not in acute distress.    Appearance: Normal appearance. She is not ill-appearing.  HENT:     Head: Normocephalic and atraumatic.     Right Ear: Tympanic membrane, ear canal and external ear normal. There is no impacted cerumen.     Left Ear: No drainage or tenderness.     Mouth/Throat:     Mouth: Mucous membranes are moist.     Pharynx: No posterior oropharyngeal erythema.     Tonsils: No tonsillar exudate or tonsillar abscesses. 1+ on the right. 1+ on the left.  Cardiovascular:     Rate and Rhythm: Normal rate and regular rhythm.     Pulses: Normal pulses.     Heart sounds: Normal heart sounds.  Pulmonary:     Effort: Pulmonary effort is normal. No respiratory distress.     Breath sounds: Normal breath sounds. No wheezing, rhonchi or rales.  Skin:    General: Skin is  warm and dry.       Neurological:     Mental Status: She is alert and oriented to person, place, and time.  Psychiatric:        Mood and Affect: Mood normal.        Behavior: Behavior normal.        Thought Content: Thought content normal.        Judgment: Judgment normal.   No results found for this or any previous visit (from the past 24 hour(s)).     The ASCVD Risk score (Arnett DK, et al., 2019) failed to calculate for the following reasons:   The 2019 ASCVD risk score is only valid for ages 34 to 55   Assessment & Plan:   1. Phlebitis Exam findings consistent with phlebitis.  Recommend warm compresses and the use of Tylenol/ibuprofen for discomfort.  Treating with Omnicef as below which should also cover for this issue.  2. Acute otitis media, unspecified otitis media type Had difficulty visualizing the entire TM in the left ear due to excessive cerumen and a very small ear canal.  What was visible was noted to be erythematous.  We will treat with Omnicef 300 mg twice daily x 7 days.  No evidence of otitis  externa.  Tylenol/ibuprofen for discomfort as needed.  Return if symptoms worsen or fail to improve.  ___________________________________________ Clearnce Sorrel, DNP, APRN, FNP-BC [facility] [place]

## 2022-05-27 ENCOUNTER — Telehealth (HOSPITAL_COMMUNITY): Payer: Self-pay

## 2022-05-27 NOTE — Telephone Encounter (Signed)
Patient reports that she is doing pretty good. "I think I'm healing pretty well. I had some stitching is there anything I should be doing to help that heal?" RN reviewed peri care and normal healing process of the perineum. Patient also reports that she is being treated for phlebitis at old IV site and an ear infection. RN reviewed keeping a close eye on phlebitis and if not improving to contact her doctor. Patient declines any other questions/concerns about her health and healing.  Patient reports that baby is doing well. Patient reports that she has had some breastfeeding challenges and that she saw a lactation consultant last Thursday and plans to follow up in a few weeks. RN reviewed breastfeeding support group with patient. Baby sleeps in a bassinet. RN reviewed ABC's of safe sleep with patient. Patient declines any questions or concerns about baby.  EPDS score is 5.  Sharyn Lull [facility] 05/27/22,1740

## 2022-05-28 ENCOUNTER — Inpatient Hospital Stay (HOSPITAL_COMMUNITY): Payer: Commercial Managed Care - PPO

## 2022-06-06 ENCOUNTER — Encounter: Payer: Self-pay | Admitting: Sports Medicine

## 2022-06-06 ENCOUNTER — Ambulatory Visit (INDEPENDENT_AMBULATORY_CARE_PROVIDER_SITE_OTHER): Payer: Commercial Managed Care - PPO | Admitting: Sports Medicine

## 2022-06-06 ENCOUNTER — Ambulatory Visit: Payer: Self-pay

## 2022-06-06 DIAGNOSIS — G5602 Carpal tunnel syndrome, left upper limb: Secondary | ICD-10-CM

## 2022-06-06 DIAGNOSIS — G5601 Carpal tunnel syndrome, right upper limb: Secondary | ICD-10-CM

## 2022-06-06 DIAGNOSIS — M25531 Pain in right wrist: Secondary | ICD-10-CM | POA: Diagnosis not present

## 2022-06-06 DIAGNOSIS — G5603 Carpal tunnel syndrome, bilateral upper limbs: Secondary | ICD-10-CM

## 2022-06-06 MED ORDER — BETAMETHASONE SOD PHOS & ACET 6 (3-3) MG/ML IJ SUSP
6.0000 mg | INTRAMUSCULAR | Status: AC | PRN
  Administered 2022-06-06: 6 mg via INTRA_ARTICULAR

## 2022-06-06 MED ORDER — LIDOCAINE HCL 1 % IJ SOLN
3.0000 mL | INTRAMUSCULAR | Status: AC | PRN
  Administered 2022-06-06: 3 mL

## 2022-06-06 NOTE — Progress Notes (Signed)
R hand dom   It didn't get better but it is a weird feeling.  Index finger along with parts of middle finger tingling mainly on R hand.   L hand only bothers her sometimes at night and when holding the baby.  She has used braces they do help some at night.

## 2022-06-06 NOTE — Progress Notes (Signed)
Brittney Mays - 37 y.o. female MRN [id number]  Date of birth: 04/19/1986  Office Visit Note: Visit Date: 06/06/2022 PCP: Luetta Nutting, DO Referred by: Luetta Nutting, DO  Subjective: Chief Complaint  Patient presents with   Right Hand - Pain   Left Hand - Pain   HPI: Brittney Mays is a pleasant 37 y.o. female who presents today for follow-up of bilateral CTS, R > L.  She had this in the 3rd trimester of her pregnancy. She recently had a delivery on 05/20/22.  Initially her pain was improved, analysis and holding the baby was moderate.  She continues with numbness and tingling into the thumb and digits 2 and 3.  Continue using the brace at nighttime and at times during the day, but this is difficult to wear.  She is taking ibuprofen occasionally for this.  Pertinent ROS were reviewed with the patient and found to be negative unless otherwise specified above in HPI.   Assessment & Plan: Visit Diagnoses:  1. Carpal tunnel syndrome, bilateral   2. Pain in right wrist    Plan: Discussed with Brittney Mays all treatment options for her carpal tunnel, her right is much more symptomatic than her left.  She will continue cock-up wrist bracing at nighttime, may use her smaller ones during the day as able.  Discussed activity modification alternating holding baby in each arm prevent wrist flexion and narrowing of the carpal tunnel inlet.  Given that her pain is still quite severe, through shared decision making elected to proceed with ultrasound-guided carpal tunnel injection with hydrodissection of the median nerve, patient tolerated well.  She will continue with ibuprofen 2-3 times a day as needed.  I would like to see how she does in about 1 month and have her follow-up if she has not received good benefit from the injection and these treatment modalities.  Future considerations would be a trial of gabapentin and/or oral prednisone, although will hold on this for now given that she is  breast-feeding.  If she desires injection into the left carpal tunnel, she may schedule this at any time.  Follow-up: in 1 month if not improved; otherwise PRN   Meds & Orders: No orders of the defined types were placed in this encounter.   Orders Placed This Encounter  Procedures   US Guided Needle Placement - No Linked Charges     Procedures: Hand/UE Inj: R carpal tunnel for carpal tunnel syndrome on 06/06/2022 5:01 PM Indications: pain and therapeutic Details: 25 G needle, ultrasound-guided volar approach Medications: 3 mL lidocaine 1 %; 6 mg betamethasone acetate-betamethasone sodium phosphate 6 (3-3) MG/ML Outcome: tolerated well, no immediate complications  Procedure: US-guided Carpal Tunnel Injection, Right Wrist After informed verbal consent and discussion on R/B/I, a timeout was performed, patient was seated on exam table. Area overlying the patient's right carpal tunnel prepped with Betadine and alcohol swabs then utilizing ultrasound guidance via an in-plane approach, the patient's carpal tunnel was injected with a mixture of 3:4:1 lidocaine:D5W:betamethasone with hydrodissection of the median nerve from the flexor retinaculum in 360 degree fashion. Visualization of the needle was achieved with a transverse, in-plane approach. Patient tolerated procedure well without immediate complications.  Procedure, treatment alternatives, risks and benefits explained, specific risks discussed. Consent was given by the patient. Immediately prior to procedure a time out was called to verify the correct patient, procedure, equipment, support staff and site/side marked as required. Patient was prepped and draped in the usual sterile fashion.  Clinical History: No specialty comments available.  She reports that she has never smoked. She has never used smokeless tobacco.  Recent Labs    07/04/21 1427  HGBA1C 5.1    Objective:   Vital Signs: LMP 08/10/2021   Physical Exam   Gen: Well-appearing, in no acute distress; non-toxic CV:  Well-perfused. Warm.  Resp: Breathing unlabored on room air; no wheezing. Psych: Fluid speech in conversation; appropriate affect; normal thought process Neuro: Sensation intact throughout. No gross coordination deficits.   Ortho Exam - Bilateral wrists: No bony TTP.  There is full range of motion in flexion and extension of bilateral wrists.  The right proximal forearm does have evidence of resolving thrombophlebitis. + Significant Tinel's at the carpal tunnel on the right, left very mild, positive Phalen's test bilaterally. NVI.  Imaging: No results found.  Past Medical/Family/Surgical/Social History: Medications & Allergies reviewed per EMR, new medications updated. Patient Active Problem List   Diagnosis Date Noted   Acute blood loss anemia 05/22/2022   Post-dates pregnancy 05/20/2022   Vaginal delivery 05/20/2022   Postpartum hemorrhage 05/20/2022   Low lying placenta nos or without hemorrhage, third trimester 04/30/2022   Primigravida of advanced maternal age in third trimester 04/30/2022   GBS bacteriuria 02/02/2022   Placenta previa antepartum 01/30/2022   AMA (advanced maternal age) multigravida 35+ 11/04/2021   Encounter for supervision of normal first pregnancy in first trimester 11/04/2021   Infertility, female 06/14/2021   Environmental allergies 02/10/2021   Acid reflux 02/10/2021   Genetic testing 07/12/2020   Family history of genetic disease 07/02/2020   Family history of nonmelanoma skin cancer    Paresthesias 04/15/2019   Myofascial pain 04/15/2019   Anxiety state 02/12/2018   Mild scoliosis 07/23/2015   Past Medical History:  Diagnosis Date   Chronic back pain    Family history of nonmelanoma skin cancer    Knee pain    Family History  Problem Relation Age of Onset   Hypertension Mother    Other Mother        acoustic neuroma   Cancer Sister        Gorlin syndrome   Leukemia Paternal  Uncle 25   Depression Maternal Grandmother    Leukemia Maternal Grandmother 62   Other Maternal Grandmother        'forked ribs', ? gorlin syndrome   Diabetes Paternal Grandfather    Other Cousin        [name]   Down syndrome Cousin    Neuropathy Neg Hx    Past Surgical History:  Procedure Laterality Date   WISDOM TOOTH EXTRACTION     Social History   Occupational History   Not on file  Tobacco Use   Smoking status: Never   Smokeless tobacco: Never  Vaping Use   Vaping Use: Never used  Substance and Sexual Activity   Alcohol use: Not Currently    Alcohol/week: 2.0 standard drinks of alcohol    Types: 2 Standard drinks or equivalent per week   Drug use: Never   Sexual activity: Yes

## 2022-07-04 ENCOUNTER — Encounter: Payer: Self-pay | Admitting: Family Medicine

## 2022-07-05 ENCOUNTER — Ambulatory Visit (INDEPENDENT_AMBULATORY_CARE_PROVIDER_SITE_OTHER): Payer: Commercial Managed Care - PPO | Admitting: Family Medicine

## 2022-07-05 ENCOUNTER — Encounter: Payer: Self-pay | Admitting: Family Medicine

## 2022-07-05 VITALS — BP 103/67 | HR 67 | Ht 67.0 in | Wt 173.0 lb

## 2022-07-05 DIAGNOSIS — L509 Urticaria, unspecified: Secondary | ICD-10-CM | POA: Diagnosis not present

## 2022-07-05 NOTE — Assessment & Plan Note (Signed)
Unclear etiology.  Checking cmp, cbc and thyroid function.  Can increase cetirizine to bid dosing for now.  If labs are normal and symptoms persist will plan to refer to allergist.

## 2022-07-05 NOTE — Progress Notes (Signed)
Brittney Mays - 37 y.o. female MRN [id number]  Date of birth: [birth year]  Subjective Chief Complaint  Patient presents with   Rash    HPI Brittney Mays is a 37 y.o. female here today with complaint of rash.   She has noticed this on three separate occasions over the past few weeks.  She is 6 weeks post partum. She does have history of seasonal allergies and is taking cetirizine.  She denies changes to diet, new soaps, detergents or skin products.  No difficulty breathing or swallowing.  Symptoms initially started on the face, most recently on neck and upper shoulders.   ROS:  A comprehensive ROS was completed and negative except as noted per HPI  Allergies  Allergen Reactions   Amoxicillin Hives    Past Medical History:  Diagnosis Date   Chronic back pain    Family history of nonmelanoma skin cancer    Knee pain     Past Surgical History:  Procedure Laterality Date   WISDOM TOOTH EXTRACTION      Social History   Socioeconomic History   Marital status: Married    Spouse name: Not on file   Number of children: 0   Years of education: Not on file   Highest education level: Bachelor's degree (e.g., BA, AB, BS)  Occupational History   Not on file  Tobacco Use   Smoking status: Never   Smokeless tobacco: Never  Vaping Use   Vaping Use: Never used  Substance and Sexual Activity   Alcohol use: Not Currently    Alcohol/week: 2.0 standard drinks of alcohol    Types: 2 Standard drinks or equivalent per week   Drug use: Never   Sexual activity: Yes  Other Topics Concern   Not on file  Social History Narrative   Lives at home with husband   Right handed   Caffeine: 1 cup/day   Social Determinants of Health   Financial Resource Strain: Not on file  Food Insecurity: No Food Insecurity (05/20/2022)   Hunger Vital Sign    Worried About Running Out of Food in the Last Year: Never true    Ran Out of Food in the Last Year: Never true  Transportation Needs: No  Transportation Needs (05/20/2022)   PRAPARE - Hydrologist (Medical): No    Lack of Transportation (Non-Medical): No  Physical Activity: Not on file  Stress: Not on file  Social Connections: Not on file    Family History  Problem Relation Age of Onset   Hypertension Mother    Other Mother        acoustic neuroma   Cancer Sister        Gorlin syndrome   Leukemia Paternal Uncle 34   Depression Maternal Grandmother    Leukemia Maternal Grandmother 70   Other Maternal Grandmother        'forked ribs', ? gorlin syndrome   Diabetes Paternal Grandfather    Other Cousin        [name]   Down syndrome Cousin    Neuropathy Neg Hx     Health Maintenance  Topic Date Due   COVID-19 Vaccine (5 - 2023-24 season) 12/31/2022 (Originally 12/30/2021)   PAP SMEAR-Modifier  11/04/2024   DTaP/Tdap/Td (3 - Td or Tdap) 02/28/2032   INFLUENZA VACCINE  Completed   Hepatitis C Screening  Completed   HIV Screening  Completed   HPV VACCINES  Aged Out     -----------------------------------------------------------------------------------------------------------------------------------------------------------------------------------------------------------------  Physical Exam BP 103/67 (BP Location: Left Arm, Patient Position: Sitting, Cuff Size: Normal)   Pulse 67   Ht '5\' 7"'$  (1.702 m)   Wt 173 lb (78.5 kg)   LMP 08/10/2021   SpO2 98%   Breastfeeding Yes   BMI 27.10 kg/m   Physical Exam Constitutional:      Appearance: Normal appearance.  HENT:     Head: Normocephalic and atraumatic.  Eyes:     General: No scleral icterus. Cardiovascular:     Rate and Rhythm: Normal rate and regular rhythm.  Pulmonary:     Effort: Pulmonary effort is normal.     Breath sounds: Normal breath sounds.  Musculoskeletal:     Cervical back: Neck supple.  Neurological:     General: No focal deficit present.     Mental Status: She is alert.  Psychiatric:        Mood and  Affect: Mood normal.        Behavior: Behavior normal.     ------------------------------------------------------------------------------------------------------------------------------------------------------------------------------------------------------------------- Assessment and Plan  Urticaria Unclear etiology.  Checking cmp, cbc and thyroid function.  Can increase cetirizine to bid dosing for now.  If labs are normal and symptoms persist will plan to refer to allergist.    No orders of the defined types were placed in this encounter.   No follow-ups on file.    This visit occurred during the SARS-CoV-2 public health emergency.  Safety protocols were in place, including screening questions prior to the visit, additional usage of staff PPE, and extensive cleaning of exam room while observing appropriate contact time as indicated for disinfecting solutions.

## 2022-07-05 NOTE — Patient Instructions (Signed)
Hives Hives (urticaria) are itchy, red, swollen areas of skin. They can show up on any part of the body. They often fade within 24 hours (acute hives). If you get new hives after the old ones fade and the cycle goes on for many days or weeks, it is called chronic hives. Hives do not spread from person to person (are not contagious). Hives can happen when your body reacts to something you are allergic to (allergen) or to something that irritates your skin. When you are exposed to something that triggers hives, your body releases a chemical called histamine. This causes redness, itching, and swelling. Hives can show up right after you are exposed to a trigger or hours later. What are the causes? Hives may be caused by: Food allergies. Insect bites or stings. Allergies to pollen or pets. Spending time in sunlight, heat, or cold (exposure). Exercise. Stress. You can also get hives from other conditions and treatments. These include: Viruses, such as the common cold. Bacterial infections, such as urinary tract infections and strep throat. Certain medicines. Contact with latex or chemicals. Allergy shots. Blood transfusions. In some cases, the cause of hives is not known (idiopathic hives). What increases the risk? You are more likely to get hives if: You are female. You have food allergies. Hives are more common if you are allergic to citrus fruits, milk, eggs, peanuts, tree nuts, or shellfish. You are allergic to: Medicines. Latex. Insects. Animals. Pollen. What are the signs or symptoms? Common symptoms of hives include raised, itchy, red or white bumps or patches on your skin. These areas may: Become large and swollen (welts). Quickly change shape and location. This may happen more than once. Be separate hives or connect over a large area of skin. Sting or become painful. Turn white when pressed in the center (blanch). In severe cases, your hands, feet, and face may also become  swollen. This may happen if hives form deeper in your skin. How is this diagnosed? Hives may be diagnosed based on your symptoms, medical history, and a physical exam. You may have skin, pee (urine), or blood tests done. These can help find out what is causing your hives and rule out other health issues. You may also have a biopsy done. This is when a small piece of skin is removed for testing. How is this treated? Treatment for hives depends on the cause and on how severe your symptoms are. You may be told to use cool, wet cloths (cool compresses) or to take cool showers to relieve itching. Treatment may also include: Medicines to help: Relieve itching (antihistamines). Reduce swelling (corticosteroids). Treat infection (antibiotics). An injectable medicine called omalizumab. You may need this if you have chronic idiopathic hives and still have symptoms even after you are treated with antihistamines. In severe cases, you may need to use a device filled with medicine that gives an emergency shot of epinephrine (auto-injector pen) to prevent a very bad allergic reaction (anaphylactic reaction). Follow these instructions at home: Medicines Take and apply over-the-counter and prescription medicines only as told by your health care provider. If you were prescribed antibiotics, take them as told by your provider. Do not stop using the antibiotic even if you start to feel better. Skin care Apply cool compresses to the affected areas. Do not scratch or rub your skin. General instructions Do not take hot showers or baths. This can make itching worse. Do not wear tight-fitting clothing. Use sunscreen. Wear protective clothing when you are outside. Avoid  anything that causes your hives. Keep a journal to help track what causes your hives. Write down: What medicines you take. What you eat and drink. What products you use on your skin. Keep all follow-up visits. Your provider will track how well  treatment is working. Contact a health care provider if: Your symptoms do not get better with medicine. Your joints are painful or swollen. You have a fever. You have pain in your abdomen. Get help right away if: Your tongue, lips, or eyelids swell. Your chest or throat feels tight. You have trouble breathing or swallowing. These symptoms may be an emergency. Use the auto-injector pen right away. Then call 911. Do not wait to see if the symptoms will go away. Do not drive yourself to the hospital. This information is not intended to replace advice given to you by your health care provider. Make sure you discuss any questions you have with your health care provider. Document Revised: 01/12/2022 Document Reviewed: 01/03/2022 Elsevier Patient Education  [place].

## 2022-07-06 LAB — THYROID PEROXIDASE ANTIBODY: Thyroperoxidase Ab SerPl-aCnc: <1 IU/mL (ref <9)

## 2022-07-06 LAB — CBC WITH DIFFERENTIAL/PLATELET
Absolute Monocytes: 482 cells/uL (ref 200–950)
Basophils Absolute: 33 cells/uL (ref 0–200)
Basophils Relative: 0.5 %
Eosinophils Absolute: 79 cells/uL (ref 15–500)
Eosinophils Relative: 1.2 %
HCT: 39.9 % (ref 35.0–45.0)
Hemoglobin: 13.3 g/dL (ref 11.7–15.5)
Lymphs Abs: 1954 cells/uL (ref 850–3900)
MCH: 31.7 pg (ref 27.0–33.0)
MCHC: 33.3 g/dL (ref 32.0–36.0)
MCV: 95.2 fL (ref 80.0–100.0)
MPV: 10.5 fL (ref 7.5–12.5)
Monocytes Relative: 7.3 %
Neutro Abs: 4052 cells/uL (ref 1500–7800)
Neutrophils Relative %: 61.4 %
Platelets: 244 10*3/uL (ref 140–400)
RBC: 4.19 10*6/uL (ref 3.80–5.10)
RDW: 12.3 % (ref 11.0–15.0)
Total Lymphocyte: 29.6 %
WBC: 6.6 10*3/uL (ref 3.8–10.8)

## 2022-07-06 LAB — COMPLETE METABOLIC PANEL WITH GFR
AG Ratio: 1.8 (calc) (ref 1.0–2.5)
ALT: 54 U/L — ABNORMAL HIGH (ref 6–29)
AST: 39 U/L — ABNORMAL HIGH (ref 10–30)
Albumin: 4.5 g/dL (ref 3.6–5.1)
Alkaline phosphatase (APISO): 79 U/L (ref 31–125)
BUN/Creatinine Ratio: 11 (calc) (ref 6–22)
BUN: 11 mg/dL (ref 7–25)
CO2: 31 mmol/L (ref 20–32)
Calcium: 9.7 mg/dL (ref 8.6–10.2)
Chloride: 105 mmol/L (ref 98–110)
Creat: 1.01 mg/dL — ABNORMAL HIGH (ref 0.50–0.97)
Globulin: 2.5 g/dL (calc) (ref 1.9–3.7)
Glucose, Bld: 84 mg/dL (ref 65–99)
Potassium: 4.5 mmol/L (ref 3.5–5.3)
Sodium: 143 mmol/L (ref 135–146)
Total Bilirubin: 0.6 mg/dL (ref 0.2–1.2)
Total Protein: 7 g/dL (ref 6.1–8.1)
eGFR: 74 mL/min/{1.73_m2} (ref >=60)

## 2022-07-06 LAB — TSH: TSH: 1.98 mIU/L

## 2022-07-06 LAB — T4, FREE: Free T4: 1 ng/dL (ref 0.8–1.8)

## 2022-07-07 ENCOUNTER — Other Ambulatory Visit: Payer: Self-pay | Admitting: Family Medicine

## 2022-07-07 DIAGNOSIS — R7989 Other specified abnormal findings of blood chemistry: Secondary | ICD-10-CM

## 2022-07-10 ENCOUNTER — Encounter: Payer: Self-pay | Admitting: Advanced Practice Midwife

## 2022-07-10 ENCOUNTER — Ambulatory Visit (INDEPENDENT_AMBULATORY_CARE_PROVIDER_SITE_OTHER): Payer: Commercial Managed Care - PPO | Admitting: Advanced Practice Midwife

## 2022-07-10 ENCOUNTER — Other Ambulatory Visit (HOSPITAL_COMMUNITY)
Admission: RE | Admit: 2022-07-10 | Discharge: 2022-07-10 | Disposition: A | Discharge status: Home / Self Care | Payer: Commercial Managed Care - PPO | Source: Ambulatory Visit | Attending: Advanced Practice Midwife | Admitting: Advanced Practice Midwife

## 2022-07-10 VITALS — BP 106/67 | HR 70 | Ht 67.0 in | Wt 170.6 lb

## 2022-07-10 DIAGNOSIS — N898 Other specified noninflammatory disorders of vagina: Secondary | ICD-10-CM | POA: Insufficient documentation

## 2022-07-10 DIAGNOSIS — O99345 Other mental disorders complicating the puerperium: Secondary | ICD-10-CM

## 2022-07-10 DIAGNOSIS — O9089 Other complications of the puerperium, not elsewhere classified: Secondary | ICD-10-CM

## 2022-07-10 DIAGNOSIS — R102 Pelvic and perineal pain: Secondary | ICD-10-CM | POA: Diagnosis not present

## 2022-07-10 DIAGNOSIS — O9270 Unspecified disorders of lactation: Secondary | ICD-10-CM | POA: Diagnosis not present

## 2022-07-10 DIAGNOSIS — F418 Other specified anxiety disorders: Secondary | ICD-10-CM

## 2022-07-10 NOTE — Progress Notes (Signed)
[facility] Partum Visit Note  Brittney Mays is a 37 y.o. [id number] female who presents for a postpartum visit. She is 7 weeks postpartum following a normal spontaneous vaginal delivery.  I have fully reviewed the prenatal and intrapartum course. The delivery was at 40.3 gestational weeks.  Anesthesia: none. Postpartum course has been going well. Baby is doing well. Baby is feeding by both breast and bottle - Similac Advance. Bleeding no bleeding. Bowel function is normal. Bladder function is normal. Patient is not sexually active. Contraception method is condoms. Postpartum depression screening: negative. Pt reports having yellow discharge. Self swab done by pt.   The pregnancy intention screening data noted above was reviewed. Potential methods of contraception were discussed. The patient elected to proceed with No data recorded.   Edinburgh Postnatal Depression Scale - 07/10/22 0936       Edinburgh Postnatal Depression Scale:  In the Past 7 Days   I have been able to laugh and see the funny side of things. 0    I have looked forward with enjoyment to things. 0    I have blamed myself unnecessarily when things went wrong. 2    I have been anxious or worried for no good reason. 0    I have felt scared or panicky for no good reason. 0    Things have been getting on top of me. 1    I have been so unhappy that I have had difficulty sleeping. 0    I have felt sad or miserable. 0    I have been so unhappy that I have been crying. 0    The thought of harming myself has occurred to me. 0    Edinburgh Postnatal Depression Scale Total 3             There are no preventive care reminders to display for this patient.  The following portions of the patient's history were reviewed and updated as appropriate: allergies, current medications, past family history, past medical history, past social history, past surgical history, and problem list.  Review of Systems Pertinent items noted in HPI  and remainder of comprehensive ROS otherwise negative.  Objective:  BP 106/67   Pulse 70   Ht '5\' 7"'$  (1.702 m)   Wt 170 lb 9.6 oz (77.4 kg)   LMP 08/10/2021   Breastfeeding Yes   BMI 26.72 kg/m    VS reviewed, nursing note reviewed,  Constitutional: well developed, well nourished, no distress HEENT: normocephalic CV: normal rate Pulm/chest wall: normal effort Breast:  Left areola with 2-3 enlarged Montgomery glands with mild localized erythema, no generalized edema or erythema Abdomen: soft Neuro: alert and oriented x 3 Skin: warm, dry Psych: affect normal  Assessment:   1. Postpartum pain --pelvic and low abdominal pain, especially when coughing, laughing, sneezing.   - AMB referral to rehabilitation  2. Pelvic pain in female  - AMB referral to rehabilitation  3. Lactation problem --Breastfeeding with some difficulties, pt with blister on left breast now healed per pt, but mostly pumping with still painful latch.   --Discussed tongue tie and reasons to consider revision procedure. Pt to f/u with lactation.   4. 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 She reports having to turn off the news and gets overwhelmed by stories of bad things happening to kids/babies. --Validated patient's feelings, especially with her job, offered counseling with [facility] in our office, pt to  notify us if these feelings persist or worsen   5. Postpartum examination following vaginal delivery --Overall doing well, bonding well with baby, good support at home.   Plan:   Essential components of care per ACOG recommendations:  1.  Mood and well being: Patient with negative depression screening today. Reviewed local resources for support.  - Patient tobacco use? No.   - hx of drug use? No.    2. Infant care and feeding:  -Patient currently breastmilk feeding? Yes. Reviewed importance of draining breast regularly to support  lactation.  -Social determinants of health (SDOH) reviewed in EPIC. No concerns       3. Sexuality, contraception and birth spacing - Patient does not want a pregnancy in the next year.   - Reviewed reproductive life planning. Reviewed contraceptive methods based on pt preferences and effectiveness.  Patient desired Female Condom today.     4. Sleep and fatigue -Encouraged family/partner/community support of 4 hrs of uninterrupted sleep to help with mood and fatigue  5. Physical Recovery  - Discussed patients delivery and complications. She describes her labor as mixed. - Patient had a Vaginal, no problems at delivery. Patient had a 2nd degree laceration. Perineal healing reviewed. Patient expressed understanding - Patient has urinary incontinence? No. - Patient is safe to resume physical and sexual activity  6.  Health Maintenance - HM due items addressed Yes - Last pap smear  Diagnosis  Date Value Ref Range Status  11/04/2021   Final   - Negative for intraepithelial lesion or malignancy (NILM)   Pap smear not done at today's visit.  -Breast Cancer screening indicated? No.   7. Chronic Disease/Pregnancy Condition follow up: None  - PCP follow up  Fatima Blank, [place] [facility] [place]

## 2022-07-11 LAB — CERVICOVAGINAL ANCILLARY ONLY
Bacterial Vaginitis (gardnerella): NEGATIVE
Candida Glabrata: NEGATIVE
Candida Vaginitis: NEGATIVE
Chlamydia: NEGATIVE
Comment: NEGATIVE
Comment: NEGATIVE
Comment: NEGATIVE
Comment: NEGATIVE
Comment: NEGATIVE
Comment: NORMAL
Neisseria Gonorrhea: NEGATIVE
Trichomonas: NEGATIVE

## 2022-07-18 ENCOUNTER — Encounter: Payer: Self-pay | Admitting: Family Medicine

## 2022-07-18 ENCOUNTER — Other Ambulatory Visit: Payer: Self-pay | Admitting: Family Medicine

## 2022-07-18 DIAGNOSIS — L509 Urticaria, unspecified: Secondary | ICD-10-CM

## 2022-07-25 ENCOUNTER — Encounter: Payer: Self-pay | Admitting: Advanced Practice Midwife

## 2022-08-21 ENCOUNTER — Encounter: Payer: Self-pay | Admitting: Family Medicine

## 2022-09-08 ENCOUNTER — Ambulatory Visit (INDEPENDENT_AMBULATORY_CARE_PROVIDER_SITE_OTHER): Payer: Commercial Managed Care - PPO | Admitting: Family Medicine

## 2022-09-08 ENCOUNTER — Encounter: Payer: Self-pay | Admitting: Family Medicine

## 2022-09-08 DIAGNOSIS — H02846 Edema of left eye, unspecified eyelid: Secondary | ICD-10-CM

## 2022-09-08 DIAGNOSIS — Z3A00 Weeks of gestation of pregnancy not specified: Secondary | ICD-10-CM

## 2022-09-08 DIAGNOSIS — L509 Urticaria, unspecified: Secondary | ICD-10-CM | POA: Diagnosis not present

## 2022-09-08 DIAGNOSIS — H02849 Edema of unspecified eye, unspecified eyelid: Secondary | ICD-10-CM | POA: Insufficient documentation

## 2022-09-08 NOTE — Assessment & Plan Note (Signed)
Improving on its on at this point.  Continue warm compress.

## 2022-09-08 NOTE — Assessment & Plan Note (Signed)
She has had prolonged lochia alba.  Encouraged Gyn f/u if not resolving.

## 2022-09-08 NOTE — Progress Notes (Signed)
Brittney Mays - 37 y.o. female MRN [id number]  Date of birth: [birth year]  Subjective Chief Complaint  Patient presents with   Cyst    HPI Brittney Mays is a 37 y.o.  female here today with complaint of swelling of the eyelid.  Noticed swelling and pain of the L eyelid a few days ago.  This is actually better today and seems to be resolving. She is using warm compresses which have been effective previously.    She did see allergist about urticaria.  Allergy testing was normal.  She does continue on cetirizine.  Has not had hive for about 3 weeks.   Still having lochia alba related to her last pregnancy. She is about 3.23m months post partum.  It has decreased but is still present.  Denies itching, burning or odor.     ROS:  A comprehensive ROS was completed and negative except as noted per HPI  Allergies  Allergen Reactions   Amoxicillin Hives   Cephalosporins Hives    Past Medical History:  Diagnosis Date   Chronic back pain    Family history of nonmelanoma skin cancer    Knee pain     Past Surgical History:  Procedure Laterality Date   WISDOM TOOTH EXTRACTION      Social History   Socioeconomic History   Marital status: Married    Spouse name: Not on file   Number of children: 0   Years of education: Not on file   Highest education level: Bachelor's degree (e.g., BA, AB, BS)  Occupational History   Not on file  Tobacco Use   Smoking status: Never   Smokeless tobacco: Never  Vaping Use   Vaping Use: Never used  Substance and Sexual Activity   Alcohol use: Not Currently    Alcohol/week: 2.0 standard drinks of alcohol    Types: 2 Standard drinks or equivalent per week   Drug use: Never   Sexual activity: Yes  Other Topics Concern   Not on file  Social History Narrative   Lives at home with husband   Right handed   Caffeine: 1 cup/day   Social Determinants of Health   Financial Resource Strain: Low Risk  (09/04/2022)   Overall Financial Resource  Strain (CARDIA)    Difficulty of Paying Living Expenses: Not hard at all  Food Insecurity: No Food Insecurity (09/04/2022)   Hunger Vital Sign    Worried About Running Out of Food in the Last Year: Never true    Ran Out of Food in the Last Year: Never true  Transportation Needs: No Transportation Needs (09/04/2022)   PRAPARE - Administrator, Civil Service (Medical): No    Lack of Transportation (Non-Medical): No  Physical Activity: Insufficiently Active (09/04/2022)   Exercise Vital Sign    Days of Exercise per Week: 1 day    Minutes of Exercise per Session: 20 min  Stress: No Stress Concern Present (09/04/2022)   [name]    Feeling of Stress : Only a little  Social Connections: Socially Isolated (09/04/2022)   Social Connection and Isolation Panel [NHANES]    Frequency of Communication with Friends and Family: Once a week    Frequency of Social Gatherings with Friends and Family: Once a week    Attends Religious Services: Never    Database administrator or Organizations: No    Attends Banker Meetings: Not on file  Marital Status: Married    Family History  Problem Relation Age of Onset   Hypertension Mother    Other Mother        acoustic neuroma   Cancer Sister        Gorlin syndrome   Leukemia Paternal Uncle 20   Depression Maternal Grandmother    Leukemia Maternal Grandmother 6   Other Maternal Grandmother        'forked ribs', ? gorlin syndrome   Diabetes Paternal Grandfather    Other Cousin        [name]   Down syndrome Cousin    Neuropathy Neg Hx     Health Maintenance  Topic Date Due   COVID-19 Vaccine (5 - 2023-24 season) 12/31/2022 (Originally 12/30/2021)   INFLUENZA VACCINE  11/30/2022   PAP SMEAR-Modifier  11/04/2024   DTaP/Tdap/Td (3 - Td or Tdap) 02/28/2032   Hepatitis C Screening  Completed   HIV Screening  Completed   HPV VACCINES  Aged Out      ----------------------------------------------------------------------------------------------------------------------------------------------------------------------------------------------------------------- Physical Exam BP (!) 96/59 (BP Location: Left Arm, Patient Position: Sitting, Cuff Size: Normal)   Pulse 80   Ht 5\' 7"  (1.702 m)   Wt 168 lb (76.2 kg)   SpO2 99%   BMI 26.31 kg/m   Physical Exam Constitutional:      Appearance: Normal appearance.  HENT:     Head: Normocephalic and atraumatic.  Eyes:     General: No scleral icterus. Musculoskeletal:     Cervical back: Neck supple.  Skin:    Comments: Slight redness without significant swelling above the L eyelid.  No significant tenderness to palpation.    Neurological:     General: No focal deficit present.     Mental Status: She is alert.  Psychiatric:        Mood and Affect: Mood normal.        Behavior: Behavior normal.     ------------------------------------------------------------------------------------------------------------------------------------------------------------------------------------------------------------------- Assessment and Plan  Abnormal lochia She has had prolonged lochia alba.  Encouraged Gyn f/u if not resolving.   Urticaria Seen by allergist.  Continues on cetirizine.  No hives for nearly 3 weeks.   Swelling of eyelid Improving on its on at this point.  Continue warm compress.    No orders of the defined types were placed in this encounter.   No follow-ups on file.    This visit occurred during the SARS-CoV-2 public health emergency.  Safety protocols were in place, including screening questions prior to the visit, additional usage of staff PPE, and extensive cleaning of exam room while observing appropriate contact time as indicated for disinfecting solutions.

## 2022-09-08 NOTE — Patient Instructions (Signed)
Continue warm compresses.  Let me know if not resolving.

## 2022-09-08 NOTE — Assessment & Plan Note (Signed)
Seen by allergist.  Continues on cetirizine.  No hives for nearly 3 weeks.

## 2022-09-11 NOTE — Therapy (Unsigned)
OUTPATIENT PHYSICAL THERAPY FEMALE PELVIC EVALUATION   Patient Name: Brittney Mays MRN: [id number] DOB:1985-09-08, 37 y.o., female Today's Date: 09/12/2022  END OF SESSION:  PT End of Session - 09/12/22 0835     Visit Number 1    Date for PT Re-Evaluation 12/05/22    Authorization Type [employer]    PT Start Time 0830    PT Stop Time 0915    PT Time Calculation (min) 45 min    Activity Tolerance Patient tolerated treatment well    Behavior During Therapy [facility] for tasks assessed/performed             Past Medical History:  Diagnosis Date   Chronic back pain    Family history of nonmelanoma skin cancer    Knee pain    Past Surgical History:  Procedure Laterality Date   WISDOM TOOTH EXTRACTION     Patient Active Problem List   Diagnosis Date Noted   Abnormal lochia 09/08/2022   Swelling of eyelid 09/08/2022   Urticaria 07/05/2022   Acute blood loss anemia 05/22/2022   Primigravida of advanced maternal age in third trimester 04/30/2022   Infertility, female 06/14/2021   Environmental allergies 02/10/2021   Acid reflux 02/10/2021   Genetic testing 07/12/2020   Family history of genetic disease 07/02/2020   Family history of nonmelanoma skin cancer    Paresthesias 04/15/2019   Myofascial pain 04/15/2019   Anxiety state 02/12/2018   Mild scoliosis 07/23/2015    PCP: Everrett Coombe, DO  REFERRING PROVIDER: Hurshel Party, CNM    REFERRING DIAG:  [phone] (ICD-10-CM) - Postpartum pain  R10.2 (ICD-10-CM) - Pelvic pain in female    THERAPY DIAG:  Muscle weakness (generalized)  Pelvic pain  Rationale for Evaluation and Treatment: Rehabilitation  ONSET DATE: 05/20/22  SUBJECTIVE:                                                                                                                                                                                           SUBJECTIVE STATEMENT: Had a vaginal birth in May 20, 2022. Pain in lower abdomen  when cough and sneeze.   Fluid intake: Yes: water    PAIN:  Are you having pain? Yes NPRS scale: 4/10 Pain location:  lower abdomen   Pain type: dull Pain description: intermittent   Aggravating factors: sneeze, cough Relieving factors: not doing the activity  PRECAUTIONS: None  WEIGHT BEARING RESTRICTIONS: No  FALLS:  Has patient fallen in last 6 months? No  LIVING ENVIRONMENT: Lives with: lives with their family  OCCUPATION: sitting, some days on feet all day  PLOF: Independent  PATIENT  GOALS: reduce pain in lower abdomen and intercourse, reduce leakage  PERTINENT HISTORY:  Vaginal delivery 05/20/22 Sexual abuse: No  BOWEL MOVEMENT: No issues  URINATION: Pain with urination: No Fully empty bladder: Yes:   Stream: Strong Urgency: No Frequency: drinking a lot of water to keep milk supply up; breast feeding Leakage: Coughing, Sneezing, and vomiting Pads: No, patient still having discharge so wears a pad  INTERCOURSE: Pain with intercourse: Initial Penetration, During Penetration, and Pain Interrupts Intercourse Ability to have vaginal penetration:  Yes: skin feel stiff Climax: yes Marinoff Scale: 2/3  PREGNANCY: Vaginal deliveries 1 Tearing Yes: second degree, 1 in perineum and 1 in vaginal wall C-section deliveries 0 Currently pregnant No  PROLAPSE: None   OBJECTIVE:   DIAGNOSTIC FINDINGS:  none   COGNITION: Overall cognitive status: Within functional limits for tasks assessed     SENSATION: Light touch: Appears intact Proprioception: Appears intact   POSTURE: No Significant postural limitations  PELVIC ALIGNMENT:  LUMBARAROM/PROM:  A/PROM A/PROM  eval  Flexion Tightness in the lumbar  Extension Decreased by 25%  Right lateral flexion full  Left lateral flexion full  Right rotation Decreased by 25%  Left rotation Decreased by 25%   (Blank rows = not tested)  LOWER EXTREMITY ROM: bilateral hip ROM is full   LOWER EXTREMITY  MMT:  MMT Right eval Left eval  Hip extension 5/5 5/5  Hip abduction 4/5 4/5  Hip adduction 5/5 4/5   PALPATION:   General  decreased opening of the lower rib cage, tension in lower abdominal                External Perineal Exam firmness along the levator ani and perineal body                             Internal Pelvic Floor redness at the posterior introitus and left side of labia where possible tears, tightness along the sides of the introitus and posterior vaginal canal  Patient confirms identification and approves PT to assess internal pelvic floor and treatment Yes  PELVIC MMT:   MMT eval  Vaginal 2/5 anterior and posterior; 1/5 laterally  Diastasis Recti 2 finger width above umbilicus, 1 finger below umbilicus          TONE: increased  PROLAPSE: none  TODAY'S TREATMENT:                                                                                                                              DATE: 09/12/22  EVAL  See below   PATIENT EDUCATION:  Education details: educated patient  on how to massage the perineal body and sides of the introitus Person educated: Patient Education method: Explanation, Demonstration, Tactile cues, and Verbal cues Education comprehension: verbalized understanding, returned demonstration, verbal cues required, tactile cues required, and needs further education  HOME EXERCISE PROGRAM: See above  ASSESSMENT:  CLINICAL IMPRESSION:  Patient is a 37 y.o. female who was seen today for physical therapy evaluation and treatment for pelvic pain. She had a vaginal birth on 05/20/22. She had a second degree tear in the perineum and internally. Patient reports Marinoff score 2/3 with intercourse due to pain. She has lower abdominal pain intermittently at level 4/10 with coughing and sneezing. She has a diastasis recti abdomen the umbilicus  2 finger width and below 1 finger width with minimal tension. Vaginal strength is 2/5 anterior and posterior  and 1/5 laterally. She has redness along the left inner labia minora and along the posterior fourchette. Patient leaks urine with coughing, vomiting  and sneezing. Patient will benefit from skilled therapy to improve tissue mobility, reduce pain and reduce urinary leakage.   OBJECTIVE IMPAIRMENTS: decreased coordination, decreased strength, increased fascial restrictions, increased muscle spasms, and pain.   ACTIVITY LIMITATIONS: continence and coughing, sneezing  PARTICIPATION LIMITATIONS: interpersonal relationship and community activity  PERSONAL FACTORS: Time since onset of injury/illness/exacerbation and 1 comorbidity: vaginal birth 05/20/22  are also affecting patient's functional outcome.   REHAB POTENTIAL: Excellent  CLINICAL DECISION MAKING: Stable/uncomplicated  EVALUATION COMPLEXITY: Low   GOALS: Goals reviewed with patient? Yes  SHORT TERM GOALS: Target date: 10/09/22  Patient independent with initial HEP for pelvic floor  and core strength.  Baseline: Goal status: INITIAL  2.  Patient independent with perineal massage to improve tissue mobility.  Baseline:  Goal status: INITIAL  3.  Patient reports her lower abdominal pain decreased </= 25% due to improved lower abdominal strength.  Baseline:  Goal status: INITIAL   LONG TERM GOALS: Target date: 12/05/22  Patient independent with advanced HEP for core, and pelvic floor to reduce pain and improve urinary  leakage.  Baseline:  Goal status: INITIAL  2.  Patient is able to cough and sneeze with minimal to no lower abdominal pain due to improved lower abdominal contraction and improved tension in diastasis recti.  Baseline:  Goal status: INITIAL  3.  Patient reports she has no urinary leakage with coughing and sneezing due to improved circular contraction of the pelvic floor and strength >/= 3/5.  Baseline:  Goal status: INITIAL  4.  Marinoff score with penile penetration vaginally </= 1/3 due to time improved  mobility of the vaginal tissue.  Baseline:  Goal status: INITIAL   PLAN:  PT FREQUENCY: 1x/week  PT DURATION: 12 weeks  PLANNED INTERVENTIONS: Therapeutic exercises, Therapeutic activity, Neuromuscular re-education, Patient/Family education, Joint mobilization, Dry Needling, Electrical stimulation, Spinal mobilization, scar mobilization, Ultrasound, Biofeedback, and Manual therapy  PLAN FOR NEXT SESSION: work on diastasis by working on back to the abdomen, work on lower rib cage mobility, lower abdominal contraction, see how the manual work to the perineum going.    Eulis Foster, PT 09/12/22 10:00 AM

## 2022-09-12 ENCOUNTER — Other Ambulatory Visit: Payer: Self-pay

## 2022-09-12 ENCOUNTER — Encounter: Payer: Self-pay | Admitting: Physical Therapy

## 2022-09-12 ENCOUNTER — Encounter: Payer: Commercial Managed Care - PPO | Attending: Advanced Practice Midwife | Admitting: Physical Therapy

## 2022-09-12 DIAGNOSIS — R102 Pelvic and perineal pain: Secondary | ICD-10-CM | POA: Diagnosis present

## 2022-09-12 DIAGNOSIS — M6281 Muscle weakness (generalized): Secondary | ICD-10-CM | POA: Diagnosis present

## 2022-09-19 ENCOUNTER — Encounter: Payer: Commercial Managed Care - PPO | Admitting: Physical Therapy

## 2022-09-19 ENCOUNTER — Encounter: Payer: Self-pay | Admitting: Physical Therapy

## 2022-09-19 DIAGNOSIS — M6281 Muscle weakness (generalized): Secondary | ICD-10-CM | POA: Diagnosis not present

## 2022-09-19 DIAGNOSIS — R102 Pelvic and perineal pain: Secondary | ICD-10-CM

## 2022-09-19 NOTE — Therapy (Signed)
OUTPATIENT PHYSICAL THERAPY FEMALE PELVIC TREATMENT   Patient Name: Brittney Mays MRN: [id number] DOB:1986/04/20, 37 y.o., female Today's Date: 09/19/2022  END OF SESSION:  PT End of Session - 09/19/22 0825     Visit Number 2    Date for PT Re-Evaluation 12/05/22    Authorization Type [employer]    PT Start Time 0825    PT Stop Time 0915    PT Time Calculation (min) 50 min    Activity Tolerance Patient tolerated treatment well    Behavior During Therapy [facility] for tasks assessed/performed             Past Medical History:  Diagnosis Date   Chronic back pain    Family history of nonmelanoma skin cancer    Knee pain    Past Surgical History:  Procedure Laterality Date   WISDOM TOOTH EXTRACTION     Patient Active Problem List   Diagnosis Date Noted   Abnormal lochia 09/08/2022   Swelling of eyelid 09/08/2022   Urticaria 07/05/2022   Acute blood loss anemia 05/22/2022   Primigravida of advanced maternal age in third trimester 04/30/2022   Infertility, female 06/14/2021   Environmental allergies 02/10/2021   Acid reflux 02/10/2021   Genetic testing 07/12/2020   Family history of genetic disease 07/02/2020   Family history of nonmelanoma skin cancer    Paresthesias 04/15/2019   Myofascial pain 04/15/2019   Anxiety state 02/12/2018   Mild scoliosis 07/23/2015    PCP: Everrett Coombe, DO  REFERRING PROVIDER: Hurshel Party, CNM    REFERRING DIAG:  [phone] (ICD-10-CM) - Postpartum pain  R10.2 (ICD-10-CM) - Pelvic pain in female    THERAPY DIAG:  Muscle weakness (generalized)  Pelvic pain  Rationale for Evaluation and Treatment: Rehabilitation  ONSET DATE: 05/20/22  SUBJECTIVE:                                                                                                                                                                                           SUBJECTIVE STATEMENT: The little bleeding I had from treatment did not happen again. I  see a new OBGYN on Thursday.   PAIN:  Are you having pain? Yes NPRS scale: 4/10 Pain location:  lower abdomen   Pain type: dull Pain description: intermittent   Aggravating factors: sneeze, cough Relieving factors: not doing the activity  PRECAUTIONS: None  WEIGHT BEARING RESTRICTIONS: No  FALLS:  Has patient fallen in last 6 months? No  LIVING ENVIRONMENT: Lives with: lives with their family  OCCUPATION: sitting, some days on feet all day  PLOF: Independent  PATIENT GOALS: reduce pain in lower  abdomen and intercourse, reduce leakage  PERTINENT HISTORY:  Vaginal delivery 05/20/22 Sexual abuse: No  BOWEL MOVEMENT: No issues  URINATION: Pain with urination: No Fully empty bladder: Yes:   Stream: Strong Urgency: No Frequency: drinking a lot of water to keep milk supply up; breast feeding Leakage: Coughing, Sneezing, and vomiting Pads: No, patient still having discharge so wears a pad  INTERCOURSE: Pain with intercourse: Initial Penetration, During Penetration, and Pain Interrupts Intercourse Ability to have vaginal penetration:  Yes: skin feel stiff Climax: yes Marinoff Scale: 2/3  PREGNANCY: Vaginal deliveries 1 Tearing Yes: second degree, 1 in perineum and 1 in vaginal wall C-section deliveries 0 Currently pregnant No  PROLAPSE: None   OBJECTIVE:   DIAGNOSTIC FINDINGS:  none   COGNITION: Overall cognitive status: Within functional limits for tasks assessed     SENSATION: Light touch: Appears intact Proprioception: Appears intact   POSTURE: No Significant postural limitations  PELVIC ALIGNMENT:  LUMBARAROM/PROM:  A/PROM A/PROM  eval  Flexion Tightness in the lumbar  Extension Decreased by 25%  Right lateral flexion full  Left lateral flexion full  Right rotation Decreased by 25%  Left rotation Decreased by 25%   (Blank rows = not tested)  LOWER EXTREMITY ROM: bilateral hip ROM is full   LOWER EXTREMITY MMT:  MMT Right eval  Left eval  Hip extension 5/5 5/5  Hip abduction 4/5 4/5  Hip adduction 5/5 4/5   PALPATION:   General  decreased opening of the lower rib cage, tension in lower abdominal                External Perineal Exam firmness along the levator ani and perineal body                             Internal Pelvic Floor redness at the posterior introitus and left side of labia where possible tears, tightness along the sides of the introitus and posterior vaginal canal  Patient confirms identification and approves PT to assess internal pelvic floor and treatment Yes  PELVIC MMT:   MMT eval 09/19/22  Vaginal 2/5 anterior and posterior; 1/5 laterally   Diastasis Recti 2 finger width above umbilicus, 1 finger below umbilicus 1 finger width above and below umilicus          TONE: increased  PROLAPSE: none  TODAY'S TREATMENT:     09/19/22 Manual: Soft tissue mobilization: Manual work to the thoracic and lumbar paraspinals Manual work to the intercostals Manual work to the quadratus and lateral trunk Myofascial release: Using the suction cup to the thoracic and lumbar area and sides of the trunk Quadruped pulling the skin from the back to the abdomen Quadruped with pulling the lower abdomen upward at the suprapubic area with patient moving forward and backward and diagonals Quadruped releasing around the lower abdomen Spinal mobilization: PA and rotational mobilization to T10-L5 Grade 3 Posterior rib cage mobilization  Exercises: Stretches/mobility: Sitting crossed legged with lateral trunk stretch Strengthening: Transverse abdominus with ball squeeze 15 x Diaphragmatic breathing to expand rib cage 15 x Supine press ball into thigh for abdominal contraction 5 x 2 for 5 sec Bridge with ball squeeze 10 x 3 Sit to stand with abdominal and pelvic floor contraction and using tactile cues   PATIENT EDUCATION: 09/19/22 Education details: Access Code: [email or web address] Person educated:  Patient Education method: Explanation, Demonstration, Tactile cues, Verbal cues, and Handouts Education comprehension: verbalized understanding, returned demonstration, verbal cues required,  tactile cues required, and needs further education  HOME EXERCISE PROGRAM: 09/19/22 Access Code: [email or web address] URL: [email or web address] Date: 09/19/2022 Prepared by: Eulis Foster  Exercises - Cat Cow  - 1 x daily - 7 x weekly - 1 sets - 10 reps - Seated Lateral Trunk Stretch on Swiss Ball  - 1 x daily - 7 x weekly - 1 sets - 10 reps - Supine Diaphragmatic Breathing  - 1 x daily - 7 x weekly - 1 sets - 10 reps - Hooklying Transversus Abdominis Palpation  - 1 x daily - 7 x weekly - 1 sets - 10 reps - Sit to Stand with Pelvic Floor Contraction  - 1 x daily - 7 x weekly - 1 sets - 10 reps   ASSESSMENT:  CLINICAL IMPRESSION: Patient is a 36 y.o. female who was seen today for physical therapy  treatment for pelvic pain. Patient is able to contract her lower abdominals in supine but has more difficulty with it in standing. She is able to perform diaphragmatic breathing with good expansion of her lower rib cage. Her diastasis rect decreased to 1 finger width.  Patient will benefit from skilled therapy to improve tissue mobility, reduce pain and reduce urinary leakage.   OBJECTIVE IMPAIRMENTS: decreased coordination, decreased strength, increased fascial restrictions, increased muscle spasms, and pain.   ACTIVITY LIMITATIONS: continence and coughing, sneezing  PARTICIPATION LIMITATIONS: interpersonal relationship and community activity  PERSONAL FACTORS: Time since onset of injury/illness/exacerbation and 1 comorbidity: vaginal birth 05/20/22  are also affecting patient's functional outcome.   REHAB POTENTIAL: Excellent  CLINICAL DECISION MAKING: Stable/uncomplicated  EVALUATION COMPLEXITY: Low   GOALS: Goals reviewed with patient? Yes  SHORT TERM GOALS: Target date: 10/09/22  Patient  independent with initial HEP for pelvic floor  and core strength.  Baseline: Goal status: INITIAL  2.  Patient independent with perineal massage to improve tissue mobility.  Baseline:  Goal status: Met 09/19/22  3.  Patient reports her lower abdominal pain decreased </= 25% due to improved lower abdominal strength.  Baseline:  Goal status: INITIAL   LONG TERM GOALS: Target date: 12/05/22  Patient independent with advanced HEP for core, and pelvic floor to reduce pain and improve urinary  leakage.  Baseline:  Goal status: INITIAL  2.  Patient is able to cough and sneeze with minimal to no lower abdominal pain due to improved lower abdominal contraction and improved tension in diastasis recti.  Baseline:  Goal status: INITIAL  3.  Patient reports she has no urinary leakage with coughing and sneezing due to improved circular contraction of the pelvic floor and strength >/= 3/5.  Baseline:  Goal status: INITIAL  4.  Marinoff score with penile penetration vaginally </= 1/3 due to time improved mobility of the vaginal tissue.  Baseline:  Goal status: INITIAL   PLAN:  PT FREQUENCY: 1x/week  PT DURATION: 12 weeks  PLANNED INTERVENTIONS: Therapeutic exercises, Therapeutic activity, Neuromuscular re-education, Patient/Family education, Joint mobilization, Dry Needling, Electrical stimulation, Spinal mobilization, scar mobilization, Ultrasound, Biofeedback, and Manual therapy  PLAN FOR NEXT SESSION:  work on lower rib cage mobility, lower abdominal contraction, core strength, manual work to the pelvic floor for circular contraction. see how it went with the obgyn  Eulis Foster, PT 09/19/22 9:29 AM

## 2022-09-21 ENCOUNTER — Encounter: Payer: Self-pay | Admitting: Family Medicine

## 2022-09-21 ENCOUNTER — Ambulatory Visit (INDEPENDENT_AMBULATORY_CARE_PROVIDER_SITE_OTHER): Payer: Commercial Managed Care - PPO | Admitting: Family Medicine

## 2022-09-21 VITALS — BP 95/61 | HR 100 | Temp 98.7°F | Ht 67.0 in | Wt 166.0 lb

## 2022-09-21 DIAGNOSIS — J020 Streptococcal pharyngitis: Secondary | ICD-10-CM | POA: Diagnosis not present

## 2022-09-21 DIAGNOSIS — J029 Acute pharyngitis, unspecified: Secondary | ICD-10-CM | POA: Diagnosis not present

## 2022-09-21 LAB — POCT RAPID STREP A (OFFICE): Rapid Strep A Screen: POSITIVE — AB

## 2022-09-21 MED ORDER — AZITHROMYCIN 250 MG PO TABS: ORAL_TABLET | ORAL | 0 refills | Status: AC

## 2022-09-21 NOTE — Patient Instructions (Signed)

## 2022-09-21 NOTE — Assessment & Plan Note (Signed)
POC rapid strep is positive.  PCN and cephalosporin allergy noted.  Treating with Azithromycin, z-pack dosing. Encouraged to stay well hydrated.

## 2022-09-21 NOTE — Progress Notes (Signed)
Brittney Mays - 37 y.o. female MRN [id number]  Date of birth: 12/30/1985  Subjective Chief Complaint  Patient presents with   Sore Throat    HPI Brittney Mays is a 37 y.o. female here today with complaint of sore throat, tonsil swelling and white spots on the her tonsils.  Symptoms started about 2 days ago.  She has had a low grade fever as well. Denies body aches, cough, headache or sinus pain.   ROS:  A comprehensive ROS was completed and negative except as noted per HPI  Allergies  Allergen Reactions   Amoxicillin Hives   Cephalosporins Hives    Past Medical History:  Diagnosis Date   Chronic back pain    Family history of nonmelanoma skin cancer    Knee pain     Past Surgical History:  Procedure Laterality Date   WISDOM TOOTH EXTRACTION      Social History   Socioeconomic History   Marital status: Married    Spouse name: Not on file   Number of children: 0   Years of education: Not on file   Highest education level: Bachelor's degree (e.g., BA, AB, BS)  Occupational History   Not on file  Tobacco Use   Smoking status: Never   Smokeless tobacco: Never  Vaping Use   Vaping Use: Never used  Substance and Sexual Activity   Alcohol use: Not Currently    Alcohol/week: 2.0 standard drinks of alcohol    Types: 2 Standard drinks or equivalent per week   Drug use: Never   Sexual activity: Yes  Other Topics Concern   Not on file  Social History Narrative   Lives at home with husband   Right handed   Caffeine: 1 cup/day   Social Determinants of Health   Financial Resource Strain: Low Risk  (09/04/2022)   Overall Financial Resource Strain (CARDIA)    Difficulty of Paying Living Expenses: Not hard at all  Food Insecurity: No Food Insecurity (09/04/2022)   Hunger Vital Sign    Worried About Running Out of Food in the Last Year: Never true    Ran Out of Food in the Last Year: Never true  Transportation Needs: No Transportation Needs (09/04/2022)   PRAPARE  - Administrator, Civil Service (Medical): No    Lack of Transportation (Non-Medical): No  Physical Activity: Insufficiently Active (09/04/2022)   Exercise Vital Sign    Days of Exercise per Week: 1 day    Minutes of Exercise per Session: 20 min  Stress: No Stress Concern Present (09/04/2022)   [name]    Feeling of Stress : Only a little  Social Connections: Socially Isolated (09/04/2022)   Social Connection and Isolation Panel [NHANES]    Frequency of Communication with Friends and Family: Once a week    Frequency of Social Gatherings with Friends and Family: Once a week    Attends Religious Services: Never    Database administrator or Organizations: No    Attends Engineer, structural: Not on file    Marital Status: Married    Family History  Problem Relation Age of Onset   Hypertension Mother    Other Mother        acoustic neuroma   Cancer Sister        Gorlin syndrome   Leukemia Paternal Uncle 20   Depression Maternal Grandmother    Leukemia Maternal Grandmother 50  Other Maternal Grandmother        'forked ribs', ? gorlin syndrome   Diabetes Paternal Grandfather    Other Cousin        [name]   Down syndrome Cousin    Neuropathy Neg Hx     Health Maintenance  Topic Date Due   COVID-19 Vaccine (5 - 2023-24 season) 12/31/2022 (Originally 12/30/2021)   INFLUENZA VACCINE  11/30/2022   PAP SMEAR-Modifier  11/04/2024   DTaP/Tdap/Td (3 - Td or Tdap) 02/28/2032   Hepatitis C Screening  Completed   HIV Screening  Completed   HPV VACCINES  Aged Out     ----------------------------------------------------------------------------------------------------------------------------------------------------------------------------------------------------------------- Physical Exam BP 95/61 (BP Location: Right Arm, Patient Position: Sitting, Cuff Size: Normal)   Pulse 100   Temp 98.7 F  (37.1 C) (Oral)   Ht 5\' 7"  (1.702 m)   Wt 166 lb (75.3 kg)   SpO2 97%   BMI 26.00 kg/m   Physical Exam Constitutional:      Appearance: She is well-developed.  HENT:     Head: Normocephalic and atraumatic.     Mouth/Throat:     Mouth: Mucous membranes are moist.     Tonsils: Tonsillar exudate present. 2+ on the right. 2+ on the left.  Musculoskeletal:     Cervical back: Neck supple.  Lymphadenopathy:     Cervical: Cervical adenopathy present.  Neurological:     Mental Status: She is alert.     ------------------------------------------------------------------------------------------------------------------------------------------------------------------------------------------------------------------- Assessment and Plan  Strep pharyngitis POC rapid strep is positive.  PCN and cephalosporin allergy noted.  Treating with Azithromycin, z-pack dosing. Encouraged to stay well hydrated.    Meds ordered this encounter  Medications   azithromycin (ZITHROMAX) 250 MG tablet    Sig: Take 2 tablets on day 1, then 1 tablet daily on days 2 through 5    Dispense:  6 tablet    Refill:  0    No follow-ups on file.    This visit occurred during the SARS-CoV-2 public health emergency.  Safety protocols were in place, including screening questions prior to the visit, additional usage of staff PPE, and extensive cleaning of exam room while observing appropriate contact time as indicated for disinfecting solutions.

## 2022-09-26 ENCOUNTER — Telehealth: Payer: Self-pay | Admitting: Physical Therapy

## 2022-09-26 ENCOUNTER — Encounter: Payer: Commercial Managed Care - PPO | Admitting: Physical Therapy

## 2022-09-26 ENCOUNTER — Encounter: Payer: Self-pay | Admitting: Physical Therapy

## 2022-09-26 DIAGNOSIS — R102 Pelvic and perineal pain: Secondary | ICD-10-CM

## 2022-09-26 DIAGNOSIS — M6281 Muscle weakness (generalized): Secondary | ICD-10-CM

## 2022-09-26 NOTE — Therapy (Signed)
OUTPATIENT PHYSICAL THERAPY FEMALE PELVIC TREATMENT   Patient Name: Brittney Mays MRN: [id number] DOB:[birth year], 37 y.o., female Today's Date: 09/26/2022  END OF SESSION:  PT End of Session - 09/26/22 0836     Visit Number 3    Date for PT Re-Evaluation 12/05/22    Authorization Type U[employer]    Authorization - Visit Number 3    Authorization - Number of Visits 30    PT Start Time 0830    PT Stop Time 0920    PT Time Calculation (min) 50 min    Activity Tolerance Patient tolerated treatment well    Behavior During Therapy [facility] for tasks assessed/performed             Past Medical History:  Diagnosis Date   Chronic back pain    Family history of nonmelanoma skin cancer    Knee pain    Past Surgical History:  Procedure Laterality Date   WISDOM TOOTH EXTRACTION     Patient Active Problem List   Diagnosis Date Noted   Strep pharyngitis 09/21/2022   Abnormal lochia 09/08/2022   Swelling of eyelid 09/08/2022   Urticaria 07/05/2022   Acute blood loss anemia 05/22/2022   Primigravida of advanced maternal age in third trimester 04/30/2022   Infertility, female 06/14/2021   Environmental allergies 02/10/2021   Acid reflux 02/10/2021   Genetic testing 07/12/2020   Family history of genetic disease 07/02/2020   Family history of nonmelanoma skin cancer    Paresthesias 04/15/2019   Myofascial pain 04/15/2019   Anxiety state 02/12/2018   Mild scoliosis 07/23/2015    PCP: Everrett Coombe, DO  REFERRING PROVIDER: Hurshel Party, CNM    REFERRING DIAG:  [phone] (ICD-10-CM) - Postpartum pain  R10.2 (ICD-10-CM) - Pelvic pain in female    THERAPY DIAG:  Muscle weakness (generalized)  Pelvic pain  Rationale for Evaluation and Treatment: Rehabilitation  ONSET DATE: 05/20/22  SUBJECTIVE:                                                                                                                                                                                            SUBJECTIVE STATEMENT: Did not see the OBGYN due to strep. I was sore after treatment in the back and abdomen. I have not noticed pain in the lower abdomen.     PAIN:  Are you having pain? Yes NPRS scale: 0/10 Pain location:  lower abdomen   Pain type: dull Pain description: intermittent   Aggravating factors: sneeze, cough Relieving factors: not doing the activity  PRECAUTIONS: None  WEIGHT BEARING RESTRICTIONS: No  FALLS:  Has patient fallen  in last 6 months? No  LIVING ENVIRONMENT: Lives with: lives with their family  OCCUPATION: sitting, some days on feet all day  PLOF: Independent  PATIENT GOALS: reduce pain in lower abdomen and intercourse, reduce leakage  PERTINENT HISTORY:  Vaginal delivery 05/20/22 Sexual abuse: No  BOWEL MOVEMENT: No issues  URINATION: Pain with urination: No Fully empty bladder: Yes:   Stream: Strong Urgency: No Frequency: drinking a lot of water to keep milk supply up; breast feeding Leakage: Coughing, Sneezing, and vomiting Pads: No, patient still having discharge so wears a pad  INTERCOURSE: Pain with intercourse: Initial Penetration, During Penetration, and Pain Interrupts Intercourse Ability to have vaginal penetration:  Yes: skin feel stiff Climax: yes Marinoff Scale: 2/3  PREGNANCY: Vaginal deliveries 1 Tearing Yes: second degree, 1 in perineum and 1 in vaginal wall C-section deliveries 0 Currently pregnant No  PROLAPSE: None   OBJECTIVE:   DIAGNOSTIC FINDINGS:  none   COGNITION: Overall cognitive status: Within functional limits for tasks assessed     SENSATION: Light touch: Appears intact Proprioception: Appears intact   POSTURE: No Significant postural limitations  PELVIC ALIGNMENT:  LUMBARAROM/PROM:  A/PROM A/PROM  eval  Flexion Tightness in the lumbar  Extension Decreased by 25%  Right lateral flexion full  Left lateral flexion full  Right rotation Decreased by 25%  Left  rotation Decreased by 25%   (Blank rows = not tested)  LOWER EXTREMITY ROM: bilateral hip ROM is full   LOWER EXTREMITY MMT:  MMT Right eval Left eval  Hip extension 5/5 5/5  Hip abduction 4/5 4/5  Hip adduction 5/5 4/5   PALPATION:   General  decreased opening of the lower rib cage, tension in lower abdominal                External Perineal Exam firmness along the levator ani and perineal body                             Internal Pelvic Floor redness at the posterior introitus and left side of labia where possible tears, tightness along the sides of the introitus and posterior vaginal canal  Patient confirms identification and approves PT to assess internal pelvic floor and treatment Yes  PELVIC MMT:   MMT eval 09/19/22 09/26/22  Vaginal 2/5 anterior and posterior; 1/5 laterally  3/5 with weak hug of therapist finger  Diastasis Recti 2 finger width above umbilicus, 1 finger below umbilicus 1 finger width above and below umilicus           TONE: increased  PROLAPSE: none  TODAY'S TREATMENT:    09/26/22 Manual: Myofascial release: Release of the urogenital diaphragm Release of the anococcygeal ligament in quadruped with trunk movements Internal pelvic floor techniques: No emotional/communication barriers or cognitive limitation. Patient is motivated to learn. Patient understands and agrees with treatment goals and plan. PT explains patient will be examined in standing, sitting, and lying down to see how their muscles and joints work. When they are ready, they will be asked to remove their underwear so PT can examine their perineum. The patient is also given the option of providing their own chaperone as one is not provided in our facility. The patient also has the right and is explained the right to defer or refuse any part of the evaluation or treatment including the internal exam. With the patient's consent, PT will use one gloved finger to gently assess the muscles of the  pelvic floor, seeing how well it contracts and relaxes and if there is muscle symmetry. After, the patient will get dressed and PT and patient will discuss exam findings and plan of care. PT and patient discuss plan of care, schedule, attendance policy and HEP activities. Going through the vagina working on the posterior wall with hip movements Manual work along the [employer] externally Externally working on the perineal body, ischiocavernosus, bulbocavernosus  Exercises: Strengthening: Supine march with core engagement 10 x each side Supine alternate shoulder extension with red band and core engagement 20 x Supine shoulder horizontal abduction 20 x with core engagement   09/19/22 Manual: Soft tissue mobilization: Manual work to the thoracic and lumbar paraspinals Manual work to the intercostals Manual work to the quadratus and lateral trunk Myofascial release: Using the suction cup to the thoracic and lumbar area and sides of the trunk Quadruped pulling the skin from the back to the abdomen Quadruped with pulling the lower abdomen upward at the suprapubic area with patient moving forward and backward and diagonals Quadruped releasing around the lower abdomen Spinal mobilization: PA and rotational mobilization to T10-L5 Grade 3 Posterior rib cage mobilization  Exercises: Stretches/mobility: Sitting crossed legged with lateral trunk stretch Strengthening: Transverse abdominus with ball squeeze 15 x Diaphragmatic breathing to expand rib cage 15 x Supine press ball into thigh for abdominal contraction 5 x 2 for 5 sec Bridge with ball squeeze 10 x 3 Sit to stand with abdominal and pelvic floor contraction and using tactile cues   PATIENT EDUCATION: 09/26/22 Education details: Access Code: [email or web address] Person educated: Patient Education method: Explanation, Demonstration, Tactile cues, Verbal cues, and Handouts Education comprehension: verbalized understanding, returned demonstration,  verbal cues required, tactile cues required, and needs further education  HOME EXERCISE PROGRAM: 09/26/22 Access Code: [email or web address] URL: [email or web address] Date: 09/26/2022 Prepared by: Eulis Foster  Exercises -- Supine March with Posterior Pelvic Tilt  - 1 x daily - 3 x weekly - 2 sets - 10 reps - Dead Bug Alternating Arm Extension  - 1 x daily - 3 x weekly - 2 sets - 10 reps - Supine Shoulder Horizontal Abduction with Resistance  - 1 x daily - 3 x weekly - 2 sets - 10 reps   ASSESSMENT:  CLINICAL IMPRESSION: Patient is a 37 y.o. female who was seen today for physical therapy  treatment for pelvic pain. Pelvic floor strength increased to 3/5 with weak hug of therapist finger. She has tightness along the right posterior vaginal wall. She would have pinching of the right hip when moving it into hip adduction and flexion. Patient is able to contract her lower abdominals now. She has not had lower abdominal pain since last visit.   Patient will benefit from skilled therapy to improve tissue mobility, reduce pain and reduce urinary leakage.   OBJECTIVE IMPAIRMENTS: decreased coordination, decreased strength, increased fascial restrictions, increased muscle spasms, and pain.   ACTIVITY LIMITATIONS: continence and coughing, sneezing  PARTICIPATION LIMITATIONS: interpersonal relationship and community activity  PERSONAL FACTORS: Time since onset of injury/illness/exacerbation and 1 comorbidity: vaginal birth 05/20/22  are also affecting patient's functional outcome.   REHAB POTENTIAL: Excellent  CLINICAL DECISION MAKING: Stable/uncomplicated  EVALUATION COMPLEXITY: Low   GOALS: Goals reviewed with patient? Yes  SHORT TERM GOALS: Target date: 10/09/22  Patient independent with initial HEP for pelvic floor  and core strength.  Baseline: Goal status: INITIAL  2.  Patient independent with perineal massage to improve tissue mobility.  Baseline:  Goal status: Met  09/19/22  3.  Patient reports her lower abdominal pain decreased </= 25% due to improved lower abdominal strength.  Baseline:  Goal status: Met 09/26/22   LONG TERM GOALS: Target date: 12/05/22  Patient independent with advanced HEP for core, and pelvic floor to reduce pain and improve urinary  leakage.  Baseline:  Goal status: INITIAL  2.  Patient is able to cough and sneeze with minimal to no lower abdominal pain due to improved lower abdominal contraction and improved tension in diastasis recti.  Baseline:  Goal status: INITIAL  3.  Patient reports she has no urinary leakage with coughing and sneezing due to improved circular contraction of the pelvic floor and strength >/= 3/5.  Baseline:  Goal status: INITIAL  4.  Marinoff score with penile penetration vaginally </= 1/3 due to time improved mobility of the vaginal tissue.  Baseline:  Goal status: INITIAL   PLAN:  PT FREQUENCY: 1x/week  PT DURATION: 12 weeks  PLANNED INTERVENTIONS: Therapeutic exercises, Therapeutic activity, Neuromuscular re-education, Patient/Family education, Joint mobilization, Dry Needling, Electrical stimulation, Spinal mobilization, scar mobilization, Ultrasound, Biofeedback, and Manual therapy  PLAN FOR NEXT SESSION:  core strength, manual work to the pelvic floor for circular contraction. Hip release to reduce pinching of hip with flexion and adduction, see how it went with the obgyn  Eulis Foster, PT 09/26/22 9:30 AM

## 2022-09-26 NOTE — Telephone Encounter (Signed)
Patient unable to accept 6/13 offered by provider

## 2022-10-03 ENCOUNTER — Encounter: Payer: Self-pay | Admitting: Physical Therapy

## 2022-10-03 ENCOUNTER — Encounter: Payer: Commercial Managed Care - PPO | Attending: Advanced Practice Midwife | Admitting: Physical Therapy

## 2022-10-03 DIAGNOSIS — R102 Pelvic and perineal pain: Secondary | ICD-10-CM | POA: Diagnosis present

## 2022-10-03 DIAGNOSIS — M6281 Muscle weakness (generalized): Secondary | ICD-10-CM | POA: Diagnosis present

## 2022-10-03 NOTE — Patient Instructions (Signed)

## 2022-10-03 NOTE — Therapy (Signed)
OUTPATIENT PHYSICAL THERAPY FEMALE PELVIC TREATMENT   Patient Name: Brittney Mays MRN: [id number] DOB:Dec 04, 1985, 37 y.o., female Today's Date: 10/03/2022  END OF SESSION:  PT End of Session - 10/03/22 0841     Visit Number 4    Date for PT Re-Evaluation 12/05/22    Authorization Type UHC    Authorization - Visit Number 4    Authorization - Number of Visits 30    PT Start Time 0835    PT Stop Time 0920    PT Time Calculation (min) 45 min    Activity Tolerance Patient tolerated treatment well    Behavior During Therapy [facility] for tasks assessed/performed             Past Medical History:  Diagnosis Date   Chronic back pain    Family history of nonmelanoma skin cancer    Knee pain    Past Surgical History:  Procedure Laterality Date   WISDOM TOOTH EXTRACTION     Patient Active Problem List   Diagnosis Date Noted   Strep pharyngitis 09/21/2022   Abnormal lochia 09/08/2022   Swelling of eyelid 09/08/2022   Urticaria 07/05/2022   Acute blood loss anemia 05/22/2022   Primigravida of advanced maternal age in third trimester 04/30/2022   Infertility, female 06/14/2021   Environmental allergies 02/10/2021   Acid reflux 02/10/2021   Genetic testing 07/12/2020   Family history of genetic disease 07/02/2020   Family history of nonmelanoma skin cancer    Paresthesias 04/15/2019   Myofascial pain 04/15/2019   Anxiety state 02/12/2018   Mild scoliosis 07/23/2015    PCP: Everrett Coombe, DO  REFERRING PROVIDER: Hurshel Party, CNM    REFERRING DIAG:  [phone] (ICD-10-CM) - Postpartum pain  R10.2 (ICD-10-CM) - Pelvic pain in female    THERAPY DIAG:  Muscle weakness (generalized)  Pelvic pain  Rationale for Evaluation and Treatment: Rehabilitation  ONSET DATE: 05/20/22  SUBJECTIVE:                                                                                                                                                                                            SUBJECTIVE STATEMENT: I felt good after last visit. My coccyx gets sore. Sore around the SI joints. I sneezed and did not have pain. I do my exercises several days per week. I was able to hold the baby and stand up was a little easier. I still have some discharge. Went well with the OBGYN. My good bacteria in the vaginal area is still off.   PAIN:  Are you having pain? Yes NPRS scale: 0/10 Pain location:  lower  abdomen   Pain type: dull Pain description: intermittent   Aggravating factors: sneeze, cough Relieving factors: not doing the activity  PRECAUTIONS: None  WEIGHT BEARING RESTRICTIONS: No  FALLS:  Has patient fallen in last 6 months? No  LIVING ENVIRONMENT: Lives with: lives with their family  OCCUPATION: sitting, some days on feet all day  PLOF: Independent  PATIENT GOALS: reduce pain in lower abdomen and intercourse, reduce leakage  PERTINENT HISTORY:  Vaginal delivery 05/20/22 Sexual abuse: No  BOWEL MOVEMENT: No issues  URINATION: Pain with urination: No Fully empty bladder: Yes:   Stream: Strong Urgency: No Frequency: drinking a lot of water to keep milk supply up; breast feeding Leakage: Coughing, Sneezing, and vomiting Pads: No, patient still having discharge so wears a pad  INTERCOURSE: Pain with intercourse: Initial Penetration, During Penetration, and Pain Interrupts Intercourse Ability to have vaginal penetration:  Yes: skin feel stiff Climax: yes Marinoff Scale: 2/3  PREGNANCY: Vaginal deliveries 1 Tearing Yes: second degree, 1 in perineum and 1 in vaginal wall C-section deliveries 0 Currently pregnant No  PROLAPSE: None   OBJECTIVE:   DIAGNOSTIC FINDINGS:  none   COGNITION: Overall cognitive status: Within functional limits for tasks assessed     SENSATION: Light touch: Appears intact Proprioception: Appears intact   POSTURE: No Significant postural limitations  PELVIC  ALIGNMENT:  LUMBARAROM/PROM:  A/PROM A/PROM  eval  Flexion Tightness in the lumbar  Extension Decreased by 25%  Right lateral flexion full  Left lateral flexion full  Right rotation Decreased by 25%  Left rotation Decreased by 25%   (Blank rows = not tested)  LOWER EXTREMITY ROM: bilateral hip ROM is full   LOWER EXTREMITY MMT:  MMT Right eval Left eval  Hip extension 5/5 5/5  Hip abduction 4/5 4/5  Hip adduction 5/5 4/5   PALPATION:   General  decreased opening of the lower rib cage, tension in lower abdominal                External Perineal Exam firmness along the levator ani and perineal body                             Internal Pelvic Floor redness at the posterior introitus and left side of labia where possible tears, tightness along the sides of the introitus and posterior vaginal canal  Patient confirms identification and approves PT to assess internal pelvic floor and treatment Yes  PELVIC MMT:   MMT eval 09/19/22 09/26/22  Vaginal 2/5 anterior and posterior; 1/5 laterally  3/5 with weak hug of therapist finger  Diastasis Recti 2 finger width above umbilicus, 1 finger below umbilicus 1 finger width above and below umilicus           TONE: increased  PROLAPSE: none  TODAY'S TREATMENT:    10/03/22 Manual: Soft tissue mobilization: To assess for dry needling Manual work to the lumbar paraspinals, along the lateral trunk Myofascial release: Quadruped pulling the fascia from the back to the abdomen to release the tissue Quadruped pulling on the abdominal wall to release the abdominal tissue from the back wall Quadruped releasing the lower abdomen anteriorly Spinal mobilization: Gapping of the L1-L5 facets in sidely Posterior anterior movement of SI joint on left to mobilize Trigger Point Dry-Needling  Treatment instructions: Expect mild to moderate muscle soreness. S/S of pneumothorax if dry needled over a lung field, and to seek immediate medical  attention should they occur. Patient  verbalized understanding of these instructions and education.  Patient Consent Given: Yes Education handout provided: Yes Muscles treated: lumbar multifidi Electrical stimulation performed: No Parameters: N/A Treatment response/outcome: elongation of muscle and trigger point response  Exercises: Stretches/mobility: Quadruped with using suction cups to the lumbar as patient goes back and forth hinging at hips and cat cow movements  Prone hip extension with knee bent and assistance at end range to stretch anterior right hip  09/26/22 Manual: Myofascial release: Release of the urogenital diaphragm Release of the anococcygeal ligament in quadruped with trunk movements Internal pelvic floor techniques: No emotional/communication barriers or cognitive limitation. Patient is motivated to learn. Patient understands and agrees with treatment goals and plan. PT explains patient will be examined in standing, sitting, and lying down to see how their muscles and joints work. When they are ready, they will be asked to remove their underwear so PT can examine their perineum. The patient is also given the option of providing their own chaperone as one is not provided in our facility. The patient also has the right and is explained the right to defer or refuse any part of the evaluation or treatment including the internal exam. With the patient's consent, PT will use one gloved finger to gently assess the muscles of the pelvic floor, seeing how well it contracts and relaxes and if there is muscle symmetry. After, the patient will get dressed and PT and patient will discuss exam findings and plan of care. PT and patient discuss plan of care, schedule, attendance policy and HEP activities. Going through the vagina working on the posterior wall with hip movements Manual work along the [employer] externally Externally working on the perineal body, ischiocavernosus, bulbocavernosus   Exercises: Strengthening: Supine march with core engagement 10 x each side Supine alternate shoulder extension with red band and core engagement 20 x Supine shoulder horizontal abduction 20 x with core engagement   09/19/22 Manual: Soft tissue mobilization: Manual work to the thoracic and lumbar paraspinals Manual work to the intercostals Manual work to the quadratus and lateral trunk Myofascial release: Using the suction cup to the thoracic and lumbar area and sides of the trunk Quadruped pulling the skin from the back to the abdomen Quadruped with pulling the lower abdomen upward at the suprapubic area with patient moving forward and backward and diagonals Quadruped releasing around the lower abdomen Spinal mobilization: PA and rotational mobilization to T10-L5 Grade 3 Posterior rib cage mobilization  Exercises: Stretches/mobility: Sitting crossed legged with lateral trunk stretch Strengthening: Transverse abdominus with ball squeeze 15 x Diaphragmatic breathing to expand rib cage 15 x Supine press ball into thigh for abdominal contraction 5 x 2 for 5 sec Bridge with ball squeeze 10 x 3 Sit to stand with abdominal and pelvic floor contraction and using tactile cues   PATIENT EDUCATION: 10/03/22 Education details: Access Code: [email or web address], information on dry needling Person educated: Patient Education method: Explanation, Demonstration, Tactile cues, Verbal cues, and Handouts Education comprehension: verbalized understanding, returned demonstration, verbal cues required, tactile cues required, and needs further education  HOME EXERCISE PROGRAM: 09/26/22 Access Code: [email or web address] URL: [email or web address] Date: 09/26/2022 Prepared by: Eulis Foster  Exercises -- Supine March with Posterior Pelvic Tilt  - 1 x daily - 3 x weekly - 2 sets - 10 reps - Dead Bug Alternating Arm Extension  - 1 x daily - 3 x weekly - 2 sets - 10 reps - Supine Shoulder Horizontal Abduction  with Resistance  - 1 x daily - 3  x weekly - 2 sets - 10 reps   ASSESSMENT:  CLINICAL IMPRESSION: Patient is a 37 y.o. female who was seen today for physical therapy  treatment for pelvic pain.  No abdominal pain.  No urinary leakage since last visit. Patient did not have pinching in right hip with full flexion after manual work. She had increased in lumbar vertebrae mobility after manual work. Patient will benefit from skilled therapy to improve tissue mobility, reduce pain and reduce urinary leakage.   OBJECTIVE IMPAIRMENTS: decreased coordination, decreased strength, increased fascial restrictions, increased muscle spasms, and pain.   ACTIVITY LIMITATIONS: continence and coughing, sneezing  PARTICIPATION LIMITATIONS: interpersonal relationship and community activity  PERSONAL FACTORS: Time since onset of injury/illness/exacerbation and 1 comorbidity: vaginal birth 05/20/22  are also affecting patient's functional outcome.   REHAB POTENTIAL: Excellent  CLINICAL DECISION MAKING: Stable/uncomplicated  EVALUATION COMPLEXITY: Low   GOALS: Goals reviewed with patient? Yes  SHORT TERM GOALS: Target date: 10/09/22  Patient independent with initial HEP for pelvic floor  and core strength.  Baseline: Goal status: Met 10/03/22  2.  Patient independent with perineal massage to improve tissue mobility.  Baseline:  Goal status: Met 09/19/22  3.  Patient reports her lower abdominal pain decreased </= 25% due to improved lower abdominal strength.  Baseline:  Goal status: Met 09/26/22   LONG TERM GOALS: Target date: 12/05/22  Patient independent with advanced HEP for core, and pelvic floor to reduce pain and improve urinary  leakage.  Baseline:  Goal status: INITIAL  2.  Patient is able to cough and sneeze with minimal to no lower abdominal pain due to improved lower abdominal contraction and improved tension in diastasis recti.  Baseline:  Goal status: INITIAL  3.  Patient reports she  has no urinary leakage with coughing and sneezing due to improved circular contraction of the pelvic floor and strength >/= 3/5.  Baseline:  Goal status: INITIAL  4.  Marinoff score with penile penetration vaginally </= 1/3 due to time improved mobility of the vaginal tissue.  Baseline:  Goal status: INITIAL   PLAN:  PT FREQUENCY: 1x/week  PT DURATION: 12 weeks  PLANNED INTERVENTIONS: Therapeutic exercises, Therapeutic activity, Neuromuscular re-education, Patient/Family education, Joint mobilization, Dry Needling, Electrical stimulation, Spinal mobilization, scar mobilization, Ultrasound, Biofeedback, and Manual therapy  PLAN FOR NEXT SESSION:  core strength, manual work to the pelvic floor for circular contraction, check lumbar ROM  Eulis Foster, PT 10/03/22 9:31 AM

## 2022-10-26 ENCOUNTER — Encounter: Payer: Self-pay | Admitting: Family Medicine

## 2022-10-26 ENCOUNTER — Ambulatory Visit (INDEPENDENT_AMBULATORY_CARE_PROVIDER_SITE_OTHER): Payer: Commercial Managed Care - PPO | Admitting: Family Medicine

## 2022-10-26 VITALS — BP 105/70 | HR 92 | Temp 98.8°F | Ht 67.0 in | Wt 166.0 lb

## 2022-10-26 DIAGNOSIS — J029 Acute pharyngitis, unspecified: Secondary | ICD-10-CM | POA: Diagnosis not present

## 2022-10-26 DIAGNOSIS — W57XXXA Bitten or stung by nonvenomous insect and other nonvenomous arthropods, initial encounter: Secondary | ICD-10-CM | POA: Diagnosis not present

## 2022-10-26 DIAGNOSIS — R509 Fever, unspecified: Secondary | ICD-10-CM

## 2022-10-26 LAB — POCT RAPID STREP A (OFFICE): Rapid Strep A Screen: NEGATIVE

## 2022-10-26 MED ORDER — DOXYCYCLINE HYCLATE 100 MG PO TABS: 200.0000 mg | ORAL_TABLET | Freq: Once | ORAL | 0 refills | Status: AC

## 2022-10-26 NOTE — Patient Instructions (Signed)
Please avoid breast-feeding for 5 days after taking the doxycycline.

## 2022-10-26 NOTE — Progress Notes (Signed)
Acute Office Visit  Subjective:     Patient ID: Brittney Mays, female    DOB: 1985-12-06, 37 y.o.   MRN: [id number]  Chief Complaint  Patient presents with   Fever    HPI Patient is in today for fever.  She says on Monday approximately 3 days ago she broke out in hives but this has been an ongoing intermittent chronic issue since she gave birth in January and has been following with the allergist for this.  She takes Zyrtec daily and when the hives breakout on Monday she also took Benadryl and restarted her famotidine.  She also uses Flonase daily.  Then the following day on Tuesday morning she took her dog for a walk in the woods and later that afternoon at work started to have some muscle aches and chills about the time she got home she checked her temperature and she was running a fever of 101.2.  Then yesterday she started noticing a little slight headache and mild sore throat.  She did have strep throat about a month ago.  She does have a infant that is nursing and the infant had a fever about 5 days ago but only for 1 day and they thought maybe was from teething. On yesterday Wednesday she actually noticed a tick on her left hip and wonders if it could have been from her walk in the woods on Tuesday morning.  She was able to easily remove the tick.  Works in the [employer] office in [place].  ROS      Objective:    BP 105/70   Pulse 92   Temp 98.8 F (37.1 C)   Ht 5\' 7"  (1.702 m)   Wt 166 lb (75.3 kg)   SpO2 97%   Breastfeeding Yes   BMI 26.00 kg/m    Physical Exam Vitals and nursing note reviewed.  Constitutional:      Appearance: She is well-developed.  HENT:     Head: Normocephalic and atraumatic.     Right Ear: Tympanic membrane, ear canal and external ear normal.     Left Ear: Tympanic membrane, ear canal and external ear normal.     Nose: Nose normal.     Mouth/Throat:     Pharynx: Posterior oropharyngeal erythema present. No oropharyngeal  exudate.     Comments: Tonsils enlarged bilaterally.  Eyes:     Conjunctiva/sclera: Conjunctivae normal.     Pupils: Pupils are equal, round, and reactive to light.  Neck:     Thyroid: No thyromegaly.  Cardiovascular:     Rate and Rhythm: Normal rate and regular rhythm.     Heart sounds: Normal heart sounds.  Pulmonary:     Effort: Pulmonary effort is normal.     Breath sounds: Normal breath sounds. No wheezing.  Musculoskeletal:     Cervical back: Neck supple.  Lymphadenopathy:     Cervical: No cervical adenopathy.  Skin:    General: Skin is warm and dry.     Coloration: Skin is not pale.  Neurological:     Mental Status: She is alert and oriented to person, place, and time.  Psychiatric:        Behavior: Behavior normal.     Results for orders placed or performed in visit on 10/26/22  POCT rapid strep A  Result Value Ref Range   Rapid Strep A Screen Negative Negative        Assessment & Plan:   Problem List Items Addressed  This Visit   None Visit Diagnoses     Pharyngitis, unspecified etiology    -  Primary   Relevant Orders   POCT rapid strep A (Completed)   Tick bite, unspecified site, initial encounter       Relevant Medications   doxycycline (VIBRA-TABS) 100 MG tablet   Fever, unspecified fever cause       Relevant Medications   doxycycline (VIBRA-TABS) 100 MG tablet      Pharyngitis-will test for strep throat since her tonsils are erythematous they are large but she says they are chronically large. Step is negative. Call if any new or worsening sxs.    Tick Bite-it is unclear if the fever or Tuesday afternoon was related to a possible tick bite from earlier that day.  The time span would be relatively brief.  But I do not have another great explanation for her fever at this point in time so we will go ahead and treat.  No additional rash except for the hives on Monday which is more of a chronic issue.  Meds ordered this encounter  Medications    doxycycline (VIBRA-TABS) 100 MG tablet    Sig: Take 2 tablets (200 mg total) by mouth once for 1 dose.    Dispense:  2 tablet    Refill:  0    Return if symptoms worsen or fail to improve.  Nani Gasser, MD

## 2022-10-31 ENCOUNTER — Encounter: Payer: Commercial Managed Care - PPO | Admitting: Physical Therapy

## 2022-11-07 ENCOUNTER — Encounter: Payer: Commercial Managed Care - PPO | Attending: Advanced Practice Midwife | Admitting: Physical Therapy

## 2022-11-07 ENCOUNTER — Encounter: Payer: Self-pay | Admitting: Physical Therapy

## 2022-11-07 DIAGNOSIS — M6281 Muscle weakness (generalized): Secondary | ICD-10-CM | POA: Diagnosis present

## 2022-11-07 DIAGNOSIS — R102 Pelvic and perineal pain unspecified side: Secondary | ICD-10-CM

## 2022-11-07 NOTE — Therapy (Signed)
OUTPATIENT PHYSICAL THERAPY FEMALE PELVIC TREATMENT   Patient Name: CLORIS FLIPPO MRN: [id number] DOB:[birth year], 37 y.o., female Today's Date: 11/07/2022  END OF SESSION:  PT End of Session - 11/07/22 1304     Visit Number 5    Date for PT Re-Evaluation 12/05/22    Authorization Type [employer]    Authorization - Visit Number 5    Authorization - Number of Visits 30    PT Start Time 1300    PT Stop Time 1345    PT Time Calculation (min) 45 min    Activity Tolerance Patient tolerated treatment well    Behavior During Therapy WFL for tasks assessed/performed             Past Medical History:  Diagnosis Date   Chronic back pain    Family history of nonmelanoma skin cancer    Knee pain    Past Surgical History:  Procedure Laterality Date   WISDOM TOOTH EXTRACTION     Patient Active Problem List   Diagnosis Date Noted   Strep pharyngitis 09/21/2022   Abnormal lochia 09/08/2022   Swelling of eyelid 09/08/2022   Urticaria 07/05/2022   Acute blood loss anemia 05/22/2022   Primigravida of advanced maternal age in third trimester 04/30/2022   Infertility, female 06/14/2021   Environmental allergies 02/10/2021   Acid reflux 02/10/2021   Genetic testing 07/12/2020   Family history of genetic disease 07/02/2020   Family history of nonmelanoma skin cancer    Paresthesias 04/15/2019   Myofascial pain 04/15/2019   Anxiety state 02/12/2018   Mild scoliosis 07/23/2015    PCP: Everrett Coombe, DO  REFERRING PROVIDER: Hurshel Party, CNM    REFERRING DIAG:  [id number] (ICD-10-CM) - Postpartum pain  R10.2 (ICD-10-CM) - Pelvic pain in female    THERAPY DIAG:  Muscle weakness (generalized)  Pelvic pain  Rationale for Evaluation and Treatment: Rehabilitation  ONSET DATE: 05/20/22  SUBJECTIVE:                                                                                                                                                                                            SUBJECTIVE STATEMENT: I have not been in therapy due to being sick and though 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 I still am having discharge vaginally. I still have to wear panty liners due to the vaginal discharge. It is a darkish color. I have not had lower abdominal pain. I have had a little bit of urinary leakage with sneeze and cough. I have not had intercourse yet.   PAIN:  Are you having pain? Yes NPRS  scale: 0/10 Pain location:  lower abdomen   Pain type: dull Pain description: intermittent   Aggravating factors: sneeze, cough Relieving factors: not doing the activity  PRECAUTIONS: None  WEIGHT BEARING RESTRICTIONS: No  FALLS:  Has patient fallen in last 6 months? No  LIVING ENVIRONMENT: Lives with: lives with their family  OCCUPATION: sitting, some days on feet all day  PLOF: Independent  PATIENT GOALS: reduce pain in lower abdomen and intercourse, reduce leakage  PERTINENT HISTORY:  Vaginal delivery 05/20/22 Sexual abuse: No  BOWEL MOVEMENT: No issues  URINATION: Pain with urination: No Fully empty bladder: Yes:   Stream: Strong Urgency: No Frequency: drinking a lot of water to keep milk supply up; breast feeding Leakage: Coughing, Sneezing, and vomiting Pads: No, patient still having discharge so wears a pad  INTERCOURSE: Pain with intercourse: Initial Penetration, During Penetration, and Pain Interrupts Intercourse Ability to have vaginal penetration:  Yes: skin feel stiff Climax: yes Marinoff Scale: 2/3  PREGNANCY: Vaginal deliveries 1 Tearing Yes: second degree, 1 in perineum and 1 in vaginal wall C-section deliveries 0 Currently pregnant No  PROLAPSE: None   OBJECTIVE:   DIAGNOSTIC FINDINGS:  none   COGNITION: Overall cognitive status: Within functional limits for tasks assessed     SENSATION: Light touch: Appears intact Proprioception: Appears intact   POSTURE: No Significant postural  limitations  PELVIC ALIGNMENT:  LUMBARAROM/PROM:  A/PROM A/PROM  eval  Flexion Tightness in the lumbar  Extension Decreased by 25%  Right lateral flexion full  Left lateral flexion full  Right rotation Decreased by 25%  Left rotation Decreased by 25%   (Blank rows = not tested)  LOWER EXTREMITY ROM: bilateral hip ROM is full   LOWER EXTREMITY MMT:  MMT Right eval Left eval  Hip extension 5/5 5/5  Hip abduction 4/5 4/5  Hip adduction 5/5 4/5   PALPATION:   General  decreased opening of the lower rib cage, tension in lower abdominal                External Perineal Exam firmness along the levator ani and perineal body                             Internal Pelvic Floor redness at the posterior introitus and left side of labia where possible tears, tightness along the sides of the introitus and posterior vaginal canal  Patient confirms identification and approves PT to assess internal pelvic floor and treatment Yes  PELVIC MMT:   MMT eval 09/19/22 09/26/22 11/07/22  Vaginal 2/5 anterior and posterior; 1/5 laterally  3/5 with weak hug of therapist finger 4/5 after manual work and VC to lift th pelvic floor  Diastasis Recti 2 finger width above umbilicus, 1 finger below umbilicus 1 finger width above and below umilicus            TONE: increased  PROLAPSE: none  TODAY'S TREATMENT:    11/07/22 Manual: Internal pelvic floor techniques: No emotional/communication barriers or cognitive limitation. Patient is motivated to learn. Patient understands and agrees with treatment goals and plan. PT explains patient will be examined in standing, sitting, and lying down to see how their muscles and joints work. When they are ready, they will be asked to remove their underwear so PT can examine their perineum. The patient is also given the option of providing their own chaperone as one is not provided in our facility. The patient also has the right  and is explained the right to defer or  refuse any part of the evaluation or treatment including the internal exam. With the patient's consent, PT will use one gloved finger to gently assess the muscles of the pelvic floor, seeing how well it contracts and relaxes and if there is muscle symmetry. After, the patient will get dressed and PT and patient will discuss exam findings and plan of care. PT and patient discuss plan of care, schedule, attendance policy and HEP activities.  Going through the vaginal canal working on the levator ani, along the introitus and along the posterior vaginal canal   Exercises: Stretches/mobility: Cat cow 10 x Strengthening: Transverse abdominus contraction in supine 5 x Supine alternate shoulder flexion with pelvic floor and abdominals engaged 20 x Supine alternate shoulder and hip flexion with core and pelvic floor contraction 20 x Sitting pelvic floor contraction holding 10 seconds 10 x     10/03/22 Manual: Soft tissue mobilization: To assess for dry needling Manual work to the lumbar paraspinals, along the lateral trunk Myofascial release: Quadruped pulling the fascia from the back to the abdomen to release the tissue Quadruped pulling on the abdominal wall to release the abdominal tissue from the back wall Quadruped releasing the lower abdomen anteriorly Spinal mobilization: Gapping of the L1-L5 facets in sidely Posterior anterior movement of SI joint on left to mobilize Trigger Point Dry-Needling  Treatment instructions: Expect mild to moderate muscle soreness. S/S of pneumothorax if dry needled over a lung field, and to seek immediate medical attention should they occur. Patient verbalized understanding of these instructions and education.  Patient Consent Given: Yes Education handout provided: Yes Muscles treated: lumbar multifidi Electrical stimulation performed: No Parameters: N/A Treatment response/outcome: elongation of muscle and trigger point  response  Exercises: Stretches/mobility: Quadruped with using suction cups to the lumbar as patient goes back and forth hinging at hips and cat cow movements  Prone hip extension with knee bent and assistance at end range to stretch anterior right hip  09/26/22 Manual: Myofascial release: Release of the urogenital diaphragm Release of the anococcygeal ligament in quadruped with trunk movements Internal pelvic floor techniques: No emotional/communication barriers or cognitive limitation. Patient is motivated to learn. Patient understands and agrees with treatment goals and plan. PT explains patient will be examined in standing, sitting, and lying down to see how their muscles and joints work. When they are ready, they will be asked to remove their underwear so PT can examine their perineum. The patient is also given the option of providing their own chaperone as one is not provided in our facility. The patient also has the right and is explained the right to defer or refuse any part of the evaluation or treatment including the internal exam. With the patient's consent, PT will use one gloved finger to gently assess the muscles of the pelvic floor, seeing how well it contracts and relaxes and if there is muscle symmetry. After, the patient will get dressed and PT and patient will discuss exam findings and plan of care. PT and patient discuss plan of care, schedule, attendance policy and HEP activities. Going through the vagina working on the posterior wall with hip movements Manual work along the [employer] externally Externally working on the perineal body, ischiocavernosus, bulbocavernosus  Exercises: Strengthening: Supine march with core engagement 10 x each side Supine alternate shoulder extension with red band and core engagement 20 x Supine shoulder horizontal abduction 20 x with core engagement     PATIENT EDUCATION:  11/07/22 Education details: Access Code: [email or web address], information on dry  needling Person educated: Patient Education method: Explanation, Demonstration, Tactile cues, Verbal cues, and Handouts Education comprehension: verbalized understanding, returned demonstration, verbal cues required, tactile cues required, and needs further education  HOME EXERCISE PROGRAM: 11/07/22 Access Code: [email or web address] URL: [email or web address] Date: 11/07/2022 Prepared by: Eulis Foster  Exercises - Cat Cow  - 1 x daily - 7 x weekly - 1 sets - 10 reps - Seated Lateral Trunk Stretch on Swiss Ball  - 1 x daily - 7 x weekly - 1 sets - 10 reps - Hooklying Transversus Abdominis Palpation  - 1 x daily - 7 x weekly - 1 sets - 5 reps - Sit to Stand with Pelvic Floor Contraction  - 1 x daily - 7 x weekly - 1 sets - 10 reps - Supine Shoulder Horizontal Abduction with Resistance  - 1 x daily - 3 x weekly - 2 sets - 10 reps - Dead Bug  - 1 x daily - 3 x weekly - 2 sets - 10 reps - Seated Pelvic Floor Contraction  - 3 x daily - 7 x weekly - 1 sets - 5 reps - 10 sec hold   ASSESSMENT:  CLINICAL IMPRESSION: Patient is a 37 y.o. female who was seen today for physical therapy  treatment for pelvic pain.  No abdominal pain.  No pinching in the hip at all. Patient continues to have vaginal discharge that makes her wear a pantyliner. . She had increased in lumbar vertebrae mobility after manual work. Her pelvic floor strength is 4/5 after manual work and verbal cues to lift the pelvic floor.  Patient will benefit from skilled therapy to improve tissue mobility, reduce pain and reduce urinary leakage.   OBJECTIVE IMPAIRMENTS: decreased coordination, decreased strength, increased fascial restrictions, increased muscle spasms, and pain.   ACTIVITY LIMITATIONS: continence and coughing, sneezing  PARTICIPATION LIMITATIONS: interpersonal relationship and community activity  PERSONAL FACTORS: Time since onset of injury/illness/exacerbation and 1 comorbidity: vaginal birth 05/20/22  are also  affecting patient's functional outcome.   REHAB POTENTIAL: Excellent  CLINICAL DECISION MAKING: Stable/uncomplicated  EVALUATION COMPLEXITY: Low   GOALS: Goals reviewed with patient? Yes  SHORT TERM GOALS: Target date: 10/09/22  Patient independent with initial HEP for pelvic floor  and core strength.  Baseline: Goal status: Met 10/03/22  2.  Patient independent with perineal massage to improve tissue mobility.  Baseline:  Goal status: Met 09/19/22  3.  Patient reports her lower abdominal pain decreased </= 25% due to improved lower abdominal strength.  Baseline:  Goal status: Met 09/26/22   LONG TERM GOALS: Target date: 12/05/22  Patient independent with advanced HEP for core, and pelvic floor to reduce pain and improve urinary  leakage.  Baseline:  Goal status: INITIAL  2.  Patient is able to cough and sneeze with minimal to no lower abdominal pain due to improved lower abdominal contraction and improved tension in diastasis recti.  Baseline:  Goal status: INITIAL  3.  Patient reports she has no urinary leakage with coughing and sneezing due to improved circular contraction of the pelvic floor and strength >/= 3/5.  Baseline:  Goal status: INITIAL  4.  Marinoff score with penile penetration vaginally </= 1/3 due to time improved mobility of the vaginal tissue.  Baseline:  Goal status: INITIAL   PLAN:  PT FREQUENCY: 1x/week  PT DURATION: 12 weeks  PLANNED INTERVENTIONS: Therapeutic exercises, Therapeutic activity, Neuromuscular re-education, Patient/Family education, Joint mobilization, Dry Needling, Electrical  stimulation, Spinal mobilization, scar mobilization, Ultrasound, Biofeedback, and Manual therapy  PLAN FOR NEXT SESSION:  core strength,  check lumbar ROM and diastasis  Eulis Foster, PT 11/07/22 1:04 PM

## 2022-11-14 ENCOUNTER — Encounter: Payer: Commercial Managed Care - PPO | Admitting: Physical Therapy

## 2022-11-14 ENCOUNTER — Encounter: Payer: Self-pay | Admitting: Physical Therapy

## 2022-11-14 DIAGNOSIS — R102 Pelvic and perineal pain: Secondary | ICD-10-CM

## 2022-11-14 DIAGNOSIS — M6281 Muscle weakness (generalized): Secondary | ICD-10-CM

## 2022-11-14 NOTE — Therapy (Signed)
OUTPATIENT PHYSICAL THERAPY FEMALE PELVIC TREATMENT   Patient Name: Brittney Mays MRN: [id number] DOB:1986-01-22, 37 y.o., female Today's Date: 11/14/2022  END OF SESSION:  PT End of Session - 11/14/22 1135     Visit Number 6    Date for PT Re-Evaluation 12/05/22    Authorization Type [employer]    Authorization - Visit Number 6    Authorization - Number of Visits 30    PT Start Time 1130    PT Stop Time 1215    PT Time Calculation (min) 45 min    Activity Tolerance Patient tolerated treatment well    Behavior During Therapy [facility] for tasks assessed/performed             Past Medical History:  Diagnosis Date   Chronic back pain    Family history of nonmelanoma skin cancer    Knee pain    Past Surgical History:  Procedure Laterality Date   WISDOM TOOTH EXTRACTION     Patient Active Problem List   Diagnosis Date Noted   Strep pharyngitis 09/21/2022   Abnormal lochia 09/08/2022   Swelling of eyelid 09/08/2022   Urticaria 07/05/2022   Acute blood loss anemia 05/22/2022   Primigravida of advanced maternal age in third trimester 04/30/2022   Infertility, female 06/14/2021   Environmental allergies 02/10/2021   Acid reflux 02/10/2021   Genetic testing 07/12/2020   Family history of genetic disease 07/02/2020   Family history of nonmelanoma skin cancer    Paresthesias 04/15/2019   Myofascial pain 04/15/2019   Anxiety state 02/12/2018   Mild scoliosis 07/23/2015    PCP: Everrett Coombe, DO  REFERRING PROVIDER: Hurshel Party, CNM    REFERRING DIAG:  [phone] (ICD-10-CM) - Postpartum pain  R10.2 (ICD-10-CM) - Pelvic pain in female    THERAPY DIAG:  Muscle weakness (generalized)  Pelvic pain  Rationale for Evaluation and Treatment: Rehabilitation  ONSET DATE: 05/20/22  SUBJECTIVE:                                                                                                                                                                                            SUBJECTIVE STATEMENT: I have done my exercises 2 times. I did a kayak class. Still no lower abdominal pain. I have not had urinary leakage since last visit. I have some soreness in the tailbone area and tightness in the lumbar.     PAIN:  Are you having pain? Yes NPRS scale: 0/10 Pain location:  lower abdomen   Pain type: dull Pain description: intermittent   Aggravating factors: sneeze, cough Relieving factors: not doing the activity  PRECAUTIONS:  None  WEIGHT BEARING RESTRICTIONS: No  FALLS:  Has patient fallen in last 6 months? No  LIVING ENVIRONMENT: Lives with: lives with their family  OCCUPATION: sitting, some days on feet all day  PLOF: Independent  PATIENT GOALS: reduce pain in lower abdomen and intercourse, reduce leakage  PERTINENT HISTORY:  Vaginal delivery 05/20/22 Sexual abuse: No  BOWEL MOVEMENT: No issues  URINATION: Pain with urination: No Fully empty bladder: Yes:   Stream: Strong Urgency: No Frequency: drinking a lot of water to keep milk supply up; breast feeding Leakage: Coughing, Sneezing, and vomiting Pads: No, patient still having discharge so wears a pad  INTERCOURSE: Pain with intercourse: Initial Penetration, During Penetration, and Pain Interrupts Intercourse Ability to have vaginal penetration:  Yes: skin feel stiff Climax: yes Marinoff Scale: 2/3  PREGNANCY: Vaginal deliveries 1 Tearing Yes: second degree, 1 in perineum and 1 in vaginal wall C-section deliveries 0 Currently pregnant No  PROLAPSE: None   OBJECTIVE:   DIAGNOSTIC FINDINGS:  none   COGNITION: Overall cognitive status: Within functional limits for tasks assessed     SENSATION: Light touch: Appears intact Proprioception: Appears intact   POSTURE: No Significant postural limitations  PELVIC ALIGNMENT:  LUMBARAROM/PROM:  A/PROM A/PROM  eval 11/14/22  Flexion Tightness in the lumbar full  Extension Decreased by 25% Decreased by 25%   Right lateral flexion full full  Left lateral flexion full full  Right rotation Decreased by 25% full  Left rotation Decreased by 25% full   (Blank rows = not tested)  LOWER EXTREMITY ROM: bilateral hip ROM is full   LOWER EXTREMITY MMT:  MMT Right eval Left eval  Hip extension 5/5 5/5  Hip abduction 4/5 4/5  Hip adduction 5/5 4/5   PALPATION:   General  decreased opening of the lower rib cage, tension in lower abdominal                External Perineal Exam firmness along the levator ani and perineal body                             Internal Pelvic Floor redness at the posterior introitus and left side of labia where possible tears, tightness along the sides of the introitus and posterior vaginal canal  Patient confirms identification and approves PT to assess internal pelvic floor and treatment Yes  PELVIC MMT:   MMT eval 09/19/22 09/26/22 11/07/22  Vaginal 2/5 anterior and posterior; 1/5 laterally  3/5 with weak hug of therapist finger 4/5 after manual work and VC to lift th pelvic floor  Diastasis Recti 2 finger width above umbilicus, 1 finger below umbilicus 1 finger width above and below umilicus  1 finger width above and below umilicus          TONE: increased  PROLAPSE: none  TODAY'S TREATMENT:    11/14/22 Exercises: Stretches/mobility: Educated patient on how to massage the vulva area and perineal body with strokes and circular massage and she returned demonstration Strengthening: Dead bug with core and pelvic floor contraction Quadruped lift alternate extremity with tactile cues to contract the lower abdomen Supine with legs at 90/90 and alternate legs as she pulls the red band apart  11/07/22 Manual: Internal pelvic floor techniques: No emotional/communication barriers or cognitive limitation. Patient is motivated to learn. Patient understands and agrees with treatment goals and plan. PT explains patient will be examined in standing, sitting, and lying down  to see how their  muscles and joints work. When they are ready, they will be asked to remove their underwear so PT can examine their perineum. The patient is also given the option of providing their own chaperone as one is not provided in our facility. The patient also has the right and is explained the right to defer or refuse any part of the evaluation or treatment including the internal exam. With the patient's consent, PT will use one gloved finger to gently assess the muscles of the pelvic floor, seeing how well it contracts and relaxes and if there is muscle symmetry. After, the patient will get dressed and PT and patient will discuss exam findings and plan of care. PT and patient discuss plan of care, schedule, attendance policy and HEP activities.  Going through the vaginal canal working on the levator ani, along the introitus and along the posterior vaginal canal   Exercises: Stretches/mobility: Cat cow 10 x Strengthening: Transverse abdominus contraction in supine 5 x Supine alternate shoulder flexion with pelvic floor and abdominals engaged 20 x Supine alternate shoulder and hip flexion with core and pelvic floor contraction 20 x Sitting pelvic floor contraction holding 10 seconds 10 x     10/03/22 Manual: Soft tissue mobilization: To assess for dry needling Manual work to the lumbar paraspinals, along the lateral trunk Myofascial release: Quadruped pulling the fascia from the back to the abdomen to release the tissue Quadruped pulling on the abdominal wall to release the abdominal tissue from the back wall Quadruped releasing the lower abdomen anteriorly Spinal mobilization: Gapping of the L1-L5 facets in sidely Posterior anterior movement of SI joint on left to mobilize Trigger Point Dry-Needling  Treatment instructions: Expect mild to moderate muscle soreness. S/S of pneumothorax if dry needled over a lung field, and to seek immediate medical attention should they occur. Patient  verbalized understanding of these instructions and education.  Patient Consent Given: Yes Education handout provided: Yes Muscles treated: lumbar multifidi Electrical stimulation performed: No Parameters: N/A Treatment response/outcome: elongation of muscle and trigger point response  Exercises: Stretches/mobility: Quadruped with using suction cups to the lumbar as patient goes back and forth hinging at hips and cat cow movements  Prone hip extension with knee bent and assistance at end range to stretch anterior right hip    PATIENT EDUCATION: 11/14/22 Education details: Access Code: [email or web address], information on dry needling, how to massage the perineum Person educated: Patient Education method: Explanation, Demonstration, Tactile cues, Verbal cues, and Handouts Education comprehension: verbalized understanding, returned demonstration, verbal cues required, tactile cues required, and needs further education  HOME EXERCISE PROGRAM: 11/14/22 Access Code: [email or web address] URL: [email or web address] Date: 11/14/2022 Prepared by: Eulis Foster  Exercises - - Supine Shoulder Horizontal Abduction with Resistance  - 1 x daily - 3 x weekly - 2 sets - 10 reps - Dead Bug  - 1 x daily - 3 x weekly - 2 sets - 10 reps - Quadruped Pelvic Floor Contraction with Opposite Arm and Leg Lift  - 1 x daily - 3 x weekly - 2 sets - 10 reps  ASSESSMENT:  CLINICAL IMPRESSION: Patient is a 37 y.o. female who was seen today for physical therapy  treatment for pelvic pain.  No abdominal pain.  Full lumbar ROM with the exception of lumbar extension. She has not had urinary leakage since last visit.   Patient is able to demonstrate perineal massage to the perineal area to reduce the sensitivity especially bo the perineal body.  Patient will benefit from skilled therapy  to improve tissue mobility, reduce pain and reduce urinary leakage.   OBJECTIVE IMPAIRMENTS: decreased coordination, decreased strength,  increased fascial restrictions, increased muscle spasms, and pain.   ACTIVITY LIMITATIONS: continence and coughing, sneezing  PARTICIPATION LIMITATIONS: interpersonal relationship and community activity  PERSONAL FACTORS: Time since onset of injury/illness/exacerbation and 1 comorbidity: vaginal birth 05/20/22  are also affecting patient's functional outcome.   REHAB POTENTIAL: Excellent  CLINICAL DECISION MAKING: Stable/uncomplicated  EVALUATION COMPLEXITY: Low   GOALS: Goals reviewed with patient? Yes  SHORT TERM GOALS: Target date: 10/09/22  Patient independent with initial HEP for pelvic floor  and core strength.  Baseline: Goal status: Met 10/03/22  2.  Patient independent with perineal massage to improve tissue mobility.  Baseline:  Goal status: Met 09/19/22  3.  Patient reports her lower abdominal pain decreased </= 25% due to improved lower abdominal strength.  Baseline:  Goal status: Met 09/26/22   LONG TERM GOALS: Target date: 12/05/22  Patient independent with advanced HEP for core, and pelvic floor to reduce pain and improve urinary  leakage.  Baseline:  Goal status: INITIAL  2.  Patient is able to cough and sneeze with minimal to no lower abdominal pain due to improved lower abdominal contraction and improved tension in diastasis recti.  Baseline:  Goal status: INITIAL  3.  Patient reports she has no urinary leakage with coughing and sneezing due to improved circular contraction of the pelvic floor and strength >/= 3/5.  Baseline:  Goal status: INITIAL  4.  Marinoff score with penile penetration vaginally </= 1/3 due to time improved mobility of the vaginal tissue.  Baseline:  Goal status: INITIAL   PLAN:  PT FREQUENCY: 1x/week  PT DURATION: 12 weeks  PLANNED INTERVENTIONS: Therapeutic exercises, Therapeutic activity, Neuromuscular re-education, Patient/Family education, Joint mobilization, Dry Needling, Electrical stimulation, Spinal mobilization, scar  mobilization, Ultrasound, Biofeedback, and Manual therapy  PLAN FOR NEXT SESSION:  core strength,  see how the sensitivity is going in the vaginal area and urinary leakage.    Eulis Foster, PT 11/14/22 11:35 AM

## 2022-11-28 ENCOUNTER — Encounter: Payer: Commercial Managed Care - PPO | Admitting: Physical Therapy

## 2022-11-28 ENCOUNTER — Encounter: Payer: Self-pay | Admitting: Physical Therapy

## 2022-11-28 DIAGNOSIS — M6281 Muscle weakness (generalized): Secondary | ICD-10-CM

## 2022-11-28 DIAGNOSIS — R102 Pelvic and perineal pain: Secondary | ICD-10-CM

## 2022-11-28 NOTE — Therapy (Signed)
OUTPATIENT PHYSICAL THERAPY FEMALE PELVIC TREATMENT   Patient Name: Brittney Mays MRN: [id number] DOB:Nov 21, 1985, 37 y.o., female Today's Date: 11/28/2022  END OF SESSION:  PT End of Session - 11/28/22 1410     Visit Number 7    Date for PT Re-Evaluation 02/27/23    Authorization Type U[employer]    Authorization - Visit Number 7    Authorization - Number of Visits 30    PT Start Time 1400    PT Stop Time 1445    PT Time Calculation (min) 45 min    Activity Tolerance Patient tolerated treatment well    Behavior During Therapy WFL for tasks assessed/performed             Past Medical History:  Diagnosis Date   Chronic back pain    Family history of nonmelanoma skin cancer    Knee pain    Past Surgical History:  Procedure Laterality Date   WISDOM TOOTH EXTRACTION     Patient Active Problem List   Diagnosis Date Noted   Strep pharyngitis 09/21/2022   Abnormal lochia 09/08/2022   Swelling of eyelid 09/08/2022   Urticaria 07/05/2022   Acute blood loss anemia 05/22/2022   Primigravida of advanced maternal age in third trimester 04/30/2022   Infertility, female 06/14/2021   Environmental allergies 02/10/2021   Acid reflux 02/10/2021   Genetic testing 07/12/2020   Family history of genetic disease 07/02/2020   Family history of nonmelanoma skin cancer    Paresthesias 04/15/2019   Myofascial pain 04/15/2019   Anxiety state 02/12/2018   Mild scoliosis 07/23/2015    PCP: Everrett Coombe, DO  REFERRING PROVIDER: Hurshel Party, CNM    REFERRING DIAG:  [phone] (ICD-10-CM) - Postpartum pain  R10.2 (ICD-10-CM) - Pelvic pain in female    THERAPY DIAG:  Muscle weakness (generalized)  Pelvic pain  Rationale for Evaluation and Treatment: Rehabilitation  ONSET DATE: 05/20/22  SUBJECTIVE:                                                                                                                                                                                            SUBJECTIVE STATEMENT: I still have coccyx pain. No lower abdominal pain.      PAIN:  Are you having pain? Yes NPRS scale: 3/10 Pain location: Coccyx  Pain type: dull Pain description: intermittent   Aggravating factors: sitting, sit to stand Relieving factors: continues till it eases off  PRECAUTIONS: None  WEIGHT BEARING RESTRICTIONS: No  FALLS:  Has patient fallen in last 6 months? No  LIVING ENVIRONMENT: Lives with: lives with their family  OCCUPATION: sitting,  some days on feet all day  PLOF: Independent  PATIENT GOALS: reduce pain in lower abdomen and intercourse, reduce leakage  PERTINENT HISTORY:  Vaginal delivery 05/20/22 Sexual abuse: No  BOWEL MOVEMENT: No issues  URINATION: Pain with urination: No Fully empty bladder: Yes:   Stream: Strong Urgency: No Frequency: drinking a lot of water to keep milk supply up; breast feeding Leakage: Coughing, Sneezing, and vomiting Pads: No, patient still having discharge so wears a pad  INTERCOURSE: Pain with intercourse: Initial Penetration, During Penetration, and Pain Interrupts Intercourse Ability to have vaginal penetration:  Yes: skin feel stiff Climax: yes Marinoff Scale: 2/3  PREGNANCY: Vaginal deliveries 1 Tearing Yes: second degree, 1 in perineum and 1 in vaginal wall C-section deliveries 0 Currently pregnant No  PROLAPSE: None   OBJECTIVE:   DIAGNOSTIC FINDINGS:  none   COGNITION: Overall cognitive status: Within functional limits for tasks assessed     SENSATION: Light touch: Appears intact Proprioception: Appears intact   POSTURE: No Significant postural limitations  PELVIC ALIGNMENT:  LUMBARAROM/PROM:  A/PROM A/PROM  eval 11/14/22  Flexion Tightness in the lumbar full  Extension Decreased by 25% Decreased by 25%  Right lateral flexion full full  Left lateral flexion full full  Right rotation Decreased by 25% full  Left rotation Decreased by 25% full    (Blank rows = not tested)  LOWER EXTREMITY ROM: bilateral hip ROM is full   LOWER EXTREMITY MMT:  MMT Right eval Left eval Right 11/28/22 Left  11/28/22  Hip extension 5/5 5/5 5/5 5/5  Hip abduction 4/5 4/5 4+/5 3+/5  Hip adduction 5/5 4/5 5/5 5/5   PALPATION:   General  decreased opening of the lower rib cage, tension in lower abdominal                External Perineal Exam firmness along the levator ani and perineal body                             Internal Pelvic Floor redness at the posterior introitus and left side of labia where possible tears, tightness along the sides of the introitus and posterior vaginal canal  Patient confirms identification and approves PT to assess internal pelvic floor and treatment Yes  PELVIC MMT:   MMT eval 09/19/22 09/26/22 11/07/22  Vaginal 2/5 anterior and posterior; 1/5 laterally  3/5 with weak hug of therapist finger 4/5 after manual work and VC to lift th pelvic floor  Diastasis Recti 2 finger width above umbilicus, 1 finger below umbilicus 1 finger width above and below umilicus  1 finger width above and below umilicus          TONE: increased  PROLAPSE: none  TODAY'S TREATMENT:    11/28/22 Manual: Soft tissue mobilization: To assess for dry needling Manual work to left coccygeus, iliococcygeus, levator ani, obturator internist, perineal body, left side of superior transverse perineum, left ischiocavernosus to elongate after dry needling Trigger Point Dry-Needling  Treatment instructions: Expect mild to moderate muscle soreness. S/S of pneumothorax if dry needled over a lung field, and to seek immediate medical attention should they occur. Patient verbalized understanding of these instructions and education.  Patient Consent Given: Yes Education handout provided: Previously provided Muscles treated: left coccygeus, iliococcygeus, levator ani, obturator internist, perineal body, left side of superior transverse perineum, left  ischiocavernosus  Electrical stimulation performed: No Parameters: N/A Treatment response/outcome: trigger point response, elongation of muscles  11/14/22 Exercises: Stretches/mobility: Educated patient on how to massage the vulva area and perineal body with strokes and circular massage and she returned demonstration Strengthening: Dead bug with core and pelvic floor contraction Quadruped lift alternate extremity with tactile cues to contract the lower abdomen Supine with legs at 90/90 and alternate legs as she pulls the red band apart  11/07/22 Manual: Internal pelvic floor techniques: No emotional/communication barriers or cognitive limitation. Patient is motivated to learn. Patient understands and agrees with treatment goals and plan. PT explains patient will be examined in standing, sitting, and lying down to see how their muscles and joints work. When they are ready, they will be asked to remove their underwear so PT can examine their perineum. The patient is also given the option of providing their own chaperone as one is not provided in our facility. The patient also has the right and is explained the right to defer or refuse any part of the evaluation or treatment including the internal exam. With the patient's consent, PT will use one gloved finger to gently assess the muscles of the pelvic floor, seeing how well it contracts and relaxes and if there is muscle symmetry. After, the patient will get dressed and PT and patient will discuss exam findings and plan of care. PT and patient discuss plan of care, schedule, attendance policy and HEP activities.  Going through the vaginal canal working on the levator ani, along the introitus and along the posterior vaginal canal   Exercises: Stretches/mobility: Cat cow 10 x Strengthening: Transverse abdominus contraction in supine 5 x Supine alternate shoulder flexion with pelvic floor and abdominals engaged 20 x Supine alternate shoulder and  hip flexion with core and pelvic floor contraction 20 x Sitting pelvic floor contraction holding 10 seconds 10 x    Exercises: Stretches/mobility: Quadruped with using suction cups to the lumbar as patient goes back and forth hinging at hips and cat cow movements  Prone hip extension with knee bent and assistance at end range to stretch anterior right hip    PATIENT EDUCATION: 11/14/22 Education details: Access Code: [email or web address], information on dry needling, how to massage the perineum Person educated: Patient Education method: Explanation, Demonstration, Tactile cues, Verbal cues, and Handouts Education comprehension: verbalized understanding, returned demonstration, verbal cues required, tactile cues required, and needs further education  HOME EXERCISE PROGRAM: 11/14/22 Access Code: [email or web address] URL: [email or web address] Date: 11/14/2022 Prepared by: Eulis Foster  Exercises - - Supine Shoulder Horizontal Abduction with Resistance  - 1 x daily - 3 x weekly - 2 sets - 10 reps - Dead Bug  - 1 x daily - 3 x weekly - 2 sets - 10 reps - Quadruped Pelvic Floor Contraction with Opposite Arm and Leg Lift  - 1 x daily - 3 x weekly - 2 sets - 10 reps  ASSESSMENT:  CLINICAL IMPRESSION: Patient is a 37 y.o. female who was seen today for physical therapy  treatment for pelvic pain.   Patient has not had abdominal pain. Patient had 1 big sneeze and leaked urine. Patient still has some sensitivity in the vaginal area and deters her from being intimate with her husband. Patient has weakness in bilateral hip abduction. She continues to have vaginal discharge. Patient did well with the dry needling to the pelvic floor muscles. Pelvic floor strength is 4/5. Patient reports coccyx pain that is intermittent at level 3/10. Patient will benefit from skilled therapy to improve tissue mobility, reduce pain and reduce urinary leakage.   OBJECTIVE IMPAIRMENTS:  decreased coordination, decreased  strength, increased fascial restrictions, increased muscle spasms, and pain.   ACTIVITY LIMITATIONS: continence and coughing, sneezing  PARTICIPATION LIMITATIONS: interpersonal relationship and community activity  PERSONAL FACTORS: Time since onset of injury/illness/exacerbation and 1 comorbidity: vaginal birth 05/20/22  are also affecting patient's functional outcome.   REHAB POTENTIAL: Excellent  CLINICAL DECISION MAKING: Stable/uncomplicated  EVALUATION COMPLEXITY: Low   GOALS: Goals reviewed with patient? Yes  SHORT TERM GOALS: Target date: 10/09/22  Patient independent with initial HEP for pelvic floor  and core strength.  Baseline: Goal status: Met 10/03/22  2.  Patient independent with perineal massage to improve tissue mobility.  Baseline:  Goal status: Met 09/19/22  3.  Patient reports her lower abdominal pain decreased </= 25% due to improved lower abdominal strength.  Baseline:  Goal status: Met 09/26/22   LONG TERM GOALS: Target date: 1029/24  Patient independent with advanced HEP for core, and pelvic floor to reduce pain and improve urinary  leakage.  Baseline:  Goal status: ongoing 11/28/22  2.  Patient is able to cough and sneeze with minimal to no lower abdominal pain due to improved lower abdominal contraction and improved tension in diastasis recti.  Baseline:   Goal status: Met 11/28/22 3.  Patient reports she has no urinary leakage with coughing and sneezing due to improved circular contraction of the pelvic floor and strength >/= 3/5.  Baseline: Had 1 big sneeze and leaked a little urine Goal status: ongoing 11/28/22  4.  Marinoff score with penile penetration vaginally </= 1/3 due to time improved mobility of the vaginal tissue.  Baseline:  Goal status: ongoing 11/28/22   PLAN:  PT FREQUENCY: 1x/week  PT DURATION: 12 weeks  PLANNED INTERVENTIONS: Therapeutic exercises, Therapeutic activity, Neuromuscular re-education, Patient/Family education,  Joint mobilization, Dry Needling, Electrical stimulation, Spinal mobilization, scar mobilization, Ultrasound, Biofeedback, and Manual therapy  PLAN FOR NEXT SESSION:  core strength,  see how dry needling did and urinary leakage.    Eulis Foster, PT 11/28/22 2:11 PM

## 2022-12-12 ENCOUNTER — Encounter: Payer: Self-pay | Admitting: Physical Therapy

## 2022-12-19 ENCOUNTER — Encounter: Payer: Self-pay | Admitting: Physical Therapy

## 2022-12-19 ENCOUNTER — Encounter: Payer: Commercial Managed Care - PPO | Attending: Advanced Practice Midwife | Admitting: Physical Therapy

## 2022-12-19 DIAGNOSIS — R102 Pelvic and perineal pain: Secondary | ICD-10-CM | POA: Diagnosis present

## 2022-12-19 DIAGNOSIS — M6281 Muscle weakness (generalized): Secondary | ICD-10-CM | POA: Diagnosis present

## 2022-12-19 NOTE — Therapy (Signed)
OUTPATIENT PHYSICAL THERAPY FEMALE PELVIC TREATMENT   Patient Name: Brittney Mays MRN: [id number] DOB:1985-05-14, 37 y.o., female Today's Date: 12/19/2022  END OF SESSION:  PT End of Session - 12/19/22 1607     Visit Number 8    Date for PT Re-Evaluation 02/27/23    Authorization Type [employer]    Authorization - Visit Number 8    Authorization - Number of Visits 30    PT Start Time 1600    PT Stop Time 1645    PT Time Calculation (min) 45 min    Activity Tolerance Patient tolerated treatment well    Behavior During Therapy WFL for tasks assessed/performed             Past Medical History:  Diagnosis Date   Chronic back pain    Family history of nonmelanoma skin cancer    Knee pain    Past Surgical History:  Procedure Laterality Date   WISDOM TOOTH EXTRACTION     Patient Active Problem List   Diagnosis Date Noted   Strep pharyngitis 09/21/2022   Abnormal lochia 09/08/2022   Swelling of eyelid 09/08/2022   Urticaria 07/05/2022   Acute blood loss anemia 05/22/2022   Primigravida of advanced maternal age in third trimester 04/30/2022   Infertility, female 06/14/2021   Environmental allergies 02/10/2021   Acid reflux 02/10/2021   Genetic testing 07/12/2020   Family history of genetic disease 07/02/2020   Family history of nonmelanoma skin cancer    Paresthesias 04/15/2019   Myofascial pain 04/15/2019   Anxiety state 02/12/2018   Mild scoliosis 07/23/2015    PCP: Everrett Coombe, DO  REFERRING PROVIDER: Hurshel Party, CNM    REFERRING DIAG:  [phone] (ICD-10-CM) - Postpartum pain  R10.2 (ICD-10-CM) - Pelvic pain in female    THERAPY DIAG:  Muscle weakness (generalized)  Pelvic pain  Rationale for Evaluation and Treatment: Rehabilitation  ONSET DATE: 05/20/22  SUBJECTIVE:                                                                                                                                                                                            SUBJECTIVE STATEMENT: I saw the doctor last Friday. The doctor feels the discharged with her uterus is figuring it out. The intercourse is still painful. The dry needling helped the coccyx pain.      PAIN:  Are you having pain? Yes NPRS scale: 2/10 Pain location: Coccyx  Pain type: dull Pain description: intermittent   Aggravating factors: sitting, sit to stand Relieving factors: continues till it eases off  PRECAUTIONS: None  WEIGHT BEARING RESTRICTIONS: No  FALLS:  Has patient fallen in last 6 months? No  LIVING ENVIRONMENT: Lives with: lives with their family  OCCUPATION: sitting, some days on feet all day  PLOF: Independent  PATIENT GOALS: reduce pain in lower abdomen and intercourse, reduce leakage  PERTINENT HISTORY:  Vaginal delivery 05/20/22 Sexual abuse: No  BOWEL MOVEMENT: No issues  URINATION: Pain with urination: No Fully empty bladder: Yes:   Stream: Strong Urgency: No Frequency: drinking a lot of water to keep milk supply up; breast feeding Leakage:no leakage Pads: No, patient still having discharge so wears a pad  INTERCOURSE: Pain with intercourse: Initial Penetration, During Penetration, and Pain Interrupts Intercourse Ability to have vaginal penetration:  Yes: skin feel stiff Climax: yes Marinoff Scale: 2/3  PREGNANCY: Vaginal deliveries 1 Tearing Yes: second degree, 1 in perineum and 1 in vaginal wall C-section deliveries 0 Currently pregnant No  PROLAPSE: None   OBJECTIVE:   DIAGNOSTIC FINDINGS:  none   COGNITION: Overall cognitive status: Within functional limits for tasks assessed     SENSATION: Light touch: Appears intact Proprioception: Appears intact   POSTURE: No Significant postural limitations  PELVIC ALIGNMENT:  LUMBARAROM/PROM:  A/PROM A/PROM  eval 11/14/22  Flexion Tightness in the lumbar full  Extension Decreased by 25% Decreased by 25%  Right lateral flexion full full  Left lateral  flexion full full  Right rotation Decreased by 25% full  Left rotation Decreased by 25% full   (Blank rows = not tested)  LOWER EXTREMITY ROM: bilateral hip ROM is full   LOWER EXTREMITY MMT:  MMT Right eval Left eval Right 11/28/22 Left  11/28/22  Hip extension 5/5 5/5 5/5 5/5  Hip abduction 4/5 4/5 4+/5 3+/5  Hip adduction 5/5 4/5 5/5 5/5   PALPATION:   General  decreased opening of the lower rib cage, tension in lower abdominal                External Perineal Exam firmness along the levator ani and perineal body                             Internal Pelvic Floor redness at the posterior introitus and left side of labia where possible tears, tightness along the sides of the introitus and posterior vaginal canal  Patient confirms identification and approves PT to assess internal pelvic floor and treatment Yes  PELVIC MMT:   MMT eval 09/19/22 09/26/22 11/07/22  Vaginal 2/5 anterior and posterior; 1/5 laterally  3/5 with weak hug of therapist finger 4/5 after manual work and VC to lift th pelvic floor  Diastasis Recti 2 finger width above umbilicus, 1 finger below umbilicus 1 finger width above and below umilicus  1 finger width above and below umilicus          TONE: increased  PROLAPSE: none  TODAY'S TREATMENT:   12/19/22 Manual: Soft tissue mobilization: To assess for dry needling Manual work to the perineal body, levator ani, superior transverse, and release of the urogenital diaphragm Internal pelvic floor techniques: No emotional/communication barriers or cognitive limitation. Patient is motivated to learn. Patient understands and agrees with treatment goals and plan. PT explains patient will be examined in standing, sitting, and lying down to see how their muscles and joints work. When they are ready, they will be asked to remove their underwear so PT can examine their perineum. The patient is also given the option of providing their own chaperone as one is not  provided  in our facility. The patient also has the right and is explained the right to defer or refuse any part of the evaluation or treatment including the internal exam. With the patient's consent, PT will use one gloved finger to gently assess the muscles of the pelvic floor, seeing how well it contracts and relaxes and if there is muscle symmetry. After, the patient will get dressed and PT and patient will discuss exam findings and plan of care. PT and patient discuss plan of care, schedule, attendance policy and HEP activities. Going through the vaginal canal working on the levator ani, perineal body, pubovaginalis, puborectalis and posterior vaginal canal Educated patient on how to perform her own manual work to the perineal body and posterior vaginal canal  Trigger Point Dry-Needling  Treatment instructions: Expect mild to moderate muscle soreness. S/S of pneumothorax if dry needled over a lung field, and to seek immediate medical attention should they occur. Patient verbalized understanding of these instructions and education.  Patient Consent Given: Yes Education handout provided: Previously provided Muscles treated:  levator ani, obturator internist, perineal body,  superior transverse perineum, ischiocavernosus  Electrical stimulation performed: No Parameters: N/A Treatment response/outcome: trigger point response, elongation of muscles     11/28/22 Manual: Soft tissue mobilization: To assess for dry needling Manual work to left coccygeus, iliococcygeus, levator ani, obturator internist, perineal body, left side of superior transverse perineum, left ischiocavernosus to elongate after dry needling Trigger Point Dry-Needling  Treatment instructions: Expect mild to moderate muscle soreness. S/S of pneumothorax if dry needled over a lung field, and to seek immediate medical attention should they occur. Patient verbalized understanding of these instructions and education.  Patient Consent  Given: Yes Education handout provided: Previously provided Muscles treated: left coccygeus, iliococcygeus, levator ani, obturator internist, perineal body, left side of superior transverse perineum, left ischiocavernosus  Electrical stimulation performed: No Parameters: N/A Treatment response/outcome: trigger point response, elongation of muscles    11/14/22 Exercises: Stretches/mobility: Educated patient on how to massage the vulva area and perineal body with strokes and circular massage and she returned demonstration Strengthening: Dead bug with core and pelvic floor contraction Quadruped lift alternate extremity with tactile cues to contract the lower abdomen Supine with legs at 90/90 and alternate legs as she pulls the red band apart   PATIENT EDUCATION: 11/14/22 Education details: Access Code: [email or web address], information on dry needling, how to massage the perineum Person educated: Patient Education method: Explanation, Demonstration, Tactile cues, Verbal cues, and Handouts Education comprehension: verbalized understanding, returned demonstration, verbal cues required, tactile cues required, and needs further education  HOME EXERCISE PROGRAM: 11/14/22 Access Code: [email or web address] URL: [email or web address] Date: 11/14/2022 Prepared by: Eulis Foster  Exercises - - Supine Shoulder Horizontal Abduction with Resistance  - 1 x daily - 3 x weekly - 2 sets - 10 reps - Dead Bug  - 1 x daily - 3 x weekly - 2 sets - 10 reps - Quadruped Pelvic Floor Contraction with Opposite Arm and Leg Lift  - 1 x daily - 3 x weekly - 2 sets - 10 reps  ASSESSMENT:  CLINICAL IMPRESSION: Patient is a 36 y.o. female who was seen today for physical therapy  treatment for pelvic pain.   No urinary leakage.  She has pain with initial penetration of the penis vaginally. The perineal body and posterior vaginal canal has restricted mobility. She is responding well to the dry needling.  Patient reports coccyx  pain that is intermittent at level 2/10 compared to 3/10. Patient will  benefit from skilled therapy to improve tissue mobility, reduce pain and reduce urinary leakage.   OBJECTIVE IMPAIRMENTS: decreased coordination, decreased strength, increased fascial restrictions, increased muscle spasms, and pain.   ACTIVITY LIMITATIONS: continence and coughing, sneezing  PARTICIPATION LIMITATIONS: interpersonal relationship and community activity  PERSONAL FACTORS: Time since onset of injury/illness/exacerbation and 1 comorbidity: vaginal birth 05/20/22  are also affecting patient's functional outcome.   REHAB POTENTIAL: Excellent  CLINICAL DECISION MAKING: Stable/uncomplicated  EVALUATION COMPLEXITY: Low   GOALS: Goals reviewed with patient? Yes  SHORT TERM GOALS: Target date: 10/09/22  Patient independent with initial HEP for pelvic floor  and core strength.  Baseline: Goal status: Met 10/03/22  2.  Patient independent with perineal massage to improve tissue mobility.  Baseline:  Goal status: Met 09/19/22  3.  Patient reports her lower abdominal pain decreased </= 25% due to improved lower abdominal strength.  Baseline:  Goal status: Met 09/26/22   LONG TERM GOALS: Target date: 1029/24  Patient independent with advanced HEP for core, and pelvic floor to reduce pain and improve urinary  leakage.  Baseline:  Goal status: ongoing 11/28/22  2.  Patient is able to cough and sneeze with minimal to no lower abdominal pain due to improved lower abdominal contraction and improved tension in diastasis recti.  Baseline:   Goal status: Met 11/28/22 3.  Patient reports she has no urinary leakage with coughing and sneezing due to improved circular contraction of the pelvic floor and strength >/= 3/5.  Baseline: Had 1 big sneeze and leaked a little urine Goal status: ongoing 11/28/22  4.  Marinoff score with penile penetration vaginally </= 1/3 due to time improved mobility of the vaginal tissue.   Baseline:  Goal status: ongoing 11/28/22   PLAN:  PT FREQUENCY: 1x/week  PT DURATION: 12 weeks  PLANNED INTERVENTIONS: Therapeutic exercises, Therapeutic activity, Neuromuscular re-education, Patient/Family education, Joint mobilization, Dry Needling, Electrical stimulation, Spinal mobilization, scar mobilization, Ultrasound, Biofeedback, and Manual therapy  PLAN FOR NEXT SESSION:  dry needling perineal body, work on the sit to stand with release by the coccyx  Eulis Foster, PT 12/19/22 4:59 PM

## 2022-12-26 ENCOUNTER — Encounter: Payer: Self-pay | Admitting: Physical Therapy

## 2022-12-26 ENCOUNTER — Encounter: Payer: Commercial Managed Care - PPO | Admitting: Physical Therapy

## 2022-12-26 DIAGNOSIS — R102 Pelvic and perineal pain: Secondary | ICD-10-CM

## 2022-12-26 DIAGNOSIS — M6281 Muscle weakness (generalized): Secondary | ICD-10-CM | POA: Diagnosis not present

## 2022-12-26 NOTE — Therapy (Signed)
OUTPATIENT PHYSICAL THERAPY FEMALE PELVIC TREATMENT   Patient Name: Brittney Mays MRN: [id number] DOB:[birth year], 37 y.o., female Today's Date: 12/26/2022  END OF SESSION:  PT End of Session - 12/26/22 0836     Visit Number 9    Date for PT Re-Evaluation 02/27/23    Authorization Type [employer]    Authorization - Visit Number 9    Authorization - Number of Visits 30    PT Start Time 0830    PT Stop Time 0915    PT Time Calculation (min) 45 min    Activity Tolerance Patient tolerated treatment well    Behavior During Therapy [facility] for tasks assessed/performed             Past Medical History:  Diagnosis Date   Chronic back pain    Family history of nonmelanoma skin cancer    Knee pain    Past Surgical History:  Procedure Laterality Date   WISDOM TOOTH EXTRACTION     Patient Active Problem List   Diagnosis Date Noted   Strep pharyngitis 09/21/2022   Abnormal lochia 09/08/2022   Swelling of eyelid 09/08/2022   Urticaria 07/05/2022   Acute blood loss anemia 05/22/2022   Primigravida of advanced maternal age in third trimester 04/30/2022   Infertility, female 06/14/2021   Environmental allergies 02/10/2021   Acid reflux 02/10/2021   Genetic testing 07/12/2020   Family history of genetic disease 07/02/2020   Family history of nonmelanoma skin cancer    Paresthesias 04/15/2019   Myofascial pain 04/15/2019   Anxiety state 02/12/2018   Mild scoliosis 07/23/2015    PCP: Everrett Coombe, DO  REFERRING PROVIDER: Hurshel Party, CNM    REFERRING DIAG:  [id number] (ICD-10-CM) - Postpartum pain  R10.2 (ICD-10-CM) - Pelvic pain in female    THERAPY DIAG:  Muscle weakness (generalized)  Pelvic pain  Rationale for Evaluation and Treatment: Rehabilitation  ONSET DATE: 05/20/22  SUBJECTIVE:                                                                                                                                                                                            SUBJECTIVE STATEMENT: I have been doing my pelvic floor massages. I get sore in the coccyx after sitting.    PAIN:  Are you having pain? Yes NPRS scale: 2/10 Pain location: Coccyx  Pain type: dull Pain description: intermittent   Aggravating factors: sitting, sit to stand Relieving factors: continues till it eases off  PRECAUTIONS: None  WEIGHT BEARING RESTRICTIONS: No  FALLS:  Has patient fallen in last 6 months? No  LIVING ENVIRONMENT: Lives with: lives with  their family  OCCUPATION: sitting, some days on feet all day  PLOF: Independent  PATIENT GOALS: reduce pain in lower abdomen and intercourse, reduce leakage  PERTINENT HISTORY:  Vaginal delivery 05/20/22 Sexual abuse: No  BOWEL MOVEMENT: No issues  URINATION: Pain with urination: No Fully empty bladder: Yes:   Stream: Strong Urgency: No Frequency: drinking a lot of water to keep milk supply up; breast feeding Leakage:no leakage Pads: No, patient still having discharge so wears a pad  INTERCOURSE: Pain with intercourse: Initial Penetration, During Penetration, and Pain Interrupts Intercourse Ability to have vaginal penetration:  Yes: skin feel stiff Climax: yes Marinoff Scale: 2/3  PREGNANCY: Vaginal deliveries 1 Tearing Yes: second degree, 1 in perineum and 1 in vaginal wall C-section deliveries 0 Currently pregnant No  PROLAPSE: None   OBJECTIVE:   DIAGNOSTIC FINDINGS:  none   COGNITION: Overall cognitive status: Within functional limits for tasks assessed     SENSATION: Light touch: Appears intact Proprioception: Appears intact   POSTURE: No Significant postural limitations  PELVIC ALIGNMENT:  LUMBARAROM/PROM:  A/PROM A/PROM  eval 11/14/22 12/26/22  Flexion Tightness in the lumbar full Full with decreased movement at T12 and L5  Extension Decreased by 25% Decreased by 25% Decreased by 25%  Right lateral flexion full full Decreased by 25%  Left lateral flexion  full full Decreased by 25%  Right rotation Decreased by 25% full Decreased by 25%  Left rotation Decreased by 25% full Decreased by 25%   (Blank rows = not tested)  LOWER EXTREMITY ROM: bilateral hip ROM is full   LOWER EXTREMITY MMT:  MMT Right eval Left eval Right 11/28/22 Left  11/28/22  Hip extension 5/5 5/5 5/5 5/5  Hip abduction 4/5 4/5 4+/5 3+/5  Hip adduction 5/5 4/5 5/5 5/5   PALPATION:   General  decreased opening of the lower rib cage, tension in lower abdominal                External Perineal Exam firmness along the levator ani and perineal body                             Internal Pelvic Floor redness at the posterior introitus and left side of labia where possible tears, tightness along the sides of the introitus and posterior vaginal canal  Patient confirms identification and approves PT to assess internal pelvic floor and treatment Yes  PELVIC MMT:   MMT eval 09/19/22 09/26/22 11/07/22  Vaginal 2/5 anterior and posterior; 1/5 laterally  3/5 with weak hug of therapist finger 4/5 after manual work and VC to lift th pelvic floor  Diastasis Recti 2 finger width above umbilicus, 1 finger below umbilicus 1 finger width above and below umilicus  1 finger width above and below umilicus          TONE: increased  PROLAPSE: none  TODAY'S TREATMENT:   12/26/22 Manual: Soft tissue mobilization: To assess for dry needling Manual work to bilateral lumbar paraspinals and quadratus Myofascial release: Using the suction cup to release the lumbar and sacral area Spinal mobilization: Mobilization to T10 to L5 to gap the vertebrae bilateral on side Mobilization of T10 -L1 in sitting with trunk rotation and sidebending  Gapping of L5 and S1  Mobilization of sacrum Trigger Point Dry-Needling  Treatment instructions: Expect mild to moderate muscle soreness. S/S of pneumothorax if dry needled over a lung field, and to seek immediate medical attention should they occur.  Patient verbalized understanding of these instructions and education.  Patient Consent Given: Yes Education handout provided: Previously provided Muscles treated:  lumbar and lower thoracic multifidi  Electrical stimulation performed: No Parameters: N/A Treatment response/outcome: trigger point response, elongation of muscles Exercises: Stretches/mobility: Standing trunk flexion with one leg on chair Childs pose to stretch the lumbar sacral region    12/19/22 Manual: Soft tissue mobilization: To assess for dry needling Manual work to the perineal body, levator ani, superior transverse, and release of the urogenital diaphragm Internal pelvic floor techniques: No emotional/communication barriers or cognitive limitation. Patient is motivated to learn. Patient understands and agrees with treatment goals and plan. PT explains patient will be examined in standing, sitting, and lying down to see how their muscles and joints work. When they are ready, they will be asked to remove their underwear so PT can examine their perineum. The patient is also given the option of providing their own chaperone as one is not provided in our facility. The patient also has the right and is explained the right to defer or refuse any part of the evaluation or treatment including the internal exam. With the patient's consent, PT will use one gloved finger to gently assess the muscles of the pelvic floor, seeing how well it contracts and relaxes and if there is muscle symmetry. After, the patient will get dressed and PT and patient will discuss exam findings and plan of care. PT and patient discuss plan of care, schedule, attendance policy and HEP activities. Going through the vaginal canal working on the levator ani, perineal body, pubovaginalis, puborectalis and posterior vaginal canal Educated patient on how to perform her own manual work to the perineal body and posterior vaginal canal  Trigger Point Dry-Needling   Treatment instructions: Expect mild to moderate muscle soreness. S/S of pneumothorax if dry needled over a lung field, and to seek immediate medical attention should they occur. Patient verbalized understanding of these instructions and education.  Patient Consent Given: Yes Education handout provided: Previously provided Muscles treated:  levator ani, obturator internist, perineal body,  superior transverse perineum, ischiocavernosus  Electrical stimulation performed: No Parameters: N/A Treatment response/outcome: trigger point response, elongation of muscles     11/28/22 Manual: Soft tissue mobilization: To assess for dry needling Manual work to left coccygeus, iliococcygeus, levator ani, obturator internist, perineal body, left side of superior transverse perineum, left ischiocavernosus to elongate after dry needling Trigger Point Dry-Needling  Treatment instructions: Expect mild to moderate muscle soreness. S/S of pneumothorax if dry needled over a lung field, and to seek immediate medical attention should they occur. Patient verbalized understanding of these instructions and education.  Patient Consent Given: Yes Education handout provided: Previously provided Muscles treated: left coccygeus, iliococcygeus, levator ani, obturator internist, perineal body, left side of superior transverse perineum, left ischiocavernosus  Electrical stimulation performed: No Parameters: N/A Treatment response/outcome: trigger point response, elongation of muscles    PATIENT EDUCATION: 11/14/22 Education details: Access Code: [email or web address], information on dry needling, how to massage the perineum Person educated: Patient Education method: Explanation, Demonstration, Tactile cues, Verbal cues, and Handouts Education comprehension: verbalized understanding, returned demonstration, verbal cues required, tactile cues required, and needs further education  HOME EXERCISE PROGRAM: 11/14/22 Access Code:  [email or web address] URL: [email or web address] Date: 11/14/2022 Prepared by: Eulis Foster  Exercises - - Supine Shoulder Horizontal Abduction with Resistance  - 1 x daily - 3 x weekly - 2 sets - 10 reps - Dead Bug  - 1 x daily - 3 x weekly -  2 sets - 10 reps - Quadruped Pelvic Floor Contraction with Opposite Arm and Leg Lift  - 1 x daily - 3 x weekly - 2 sets - 10 reps  ASSESSMENT:  CLINICAL IMPRESSION: Patient is a 37 y.o. female who was seen today for physical therapy  treatment for pelvic pain.   No urinary leakage. Patient has decreased mobility of L3-S1 and T10-T12. She has fascial tightness in the same areas reducing spinal  motion. She still has some coccyx pain. She is doing her perineal massage but the area is a little sensitive.  Patient will benefit from skilled therapy to improve tissue mobility, reduce pain and reduce urinary leakage.   OBJECTIVE IMPAIRMENTS: decreased coordination, decreased strength, increased fascial restrictions, increased muscle spasms, and pain.   ACTIVITY LIMITATIONS: continence and coughing, sneezing  PARTICIPATION LIMITATIONS: interpersonal relationship and community activity  PERSONAL FACTORS: Time since onset of injury/illness/exacerbation and 1 comorbidity: vaginal birth 05/20/22  are also affecting patient's functional outcome.   REHAB POTENTIAL: Excellent  CLINICAL DECISION MAKING: Stable/uncomplicated  EVALUATION COMPLEXITY: Low   GOALS: Goals reviewed with patient? Yes  SHORT TERM GOALS: Target date: 10/09/22  Patient independent with initial HEP for pelvic floor  and core strength.  Baseline: Goal status: Met 10/03/22  2.  Patient independent with perineal massage to improve tissue mobility.  Baseline:  Goal status: Met 09/19/22  3.  Patient reports her lower abdominal pain decreased </= 25% due to improved lower abdominal strength.  Baseline:  Goal status: Met 09/26/22   LONG TERM GOALS: Target date: 1029/24  Patient  independent with advanced HEP for core, and pelvic floor to reduce pain and improve urinary  leakage.  Baseline:  Goal status: ongoing 11/28/22  2.  Patient is able to cough and sneeze with minimal to no lower abdominal pain due to improved lower abdominal contraction and improved tension in diastasis recti.  Baseline:   Goal status: Met 11/28/22  3.  Patient reports she has no urinary leakage with coughing and sneezing due to improved circular contraction of the pelvic floor and strength >/= 3/5.  Baseline: Had 1 big sneeze and leaked a little urine Goal status: ongoing 11/28/22  4.  Marinoff score with penile penetration vaginally </= 1/3 due to time improved mobility of the vaginal tissue.  Baseline:  Goal status: ongoing 11/28/22   PLAN:  PT FREQUENCY: 1x/week  PT DURATION: 12 weeks  PLANNED INTERVENTIONS: Therapeutic exercises, Therapeutic activity, Neuromuscular re-education, Patient/Family education, Joint mobilization, Dry Needling, Electrical stimulation, Spinal mobilization, scar mobilization, Ultrasound, Biofeedback, and Manual therapy  PLAN FOR NEXT SESSION:  dry needling perineal body or lumbar,  work on the sit to stand with release by the coccyx; review HEP  Eulis Foster, PT 12/26/22 9:30 AM

## 2023-01-21 ENCOUNTER — Encounter: Payer: Self-pay | Admitting: Emergency Medicine

## 2023-01-21 ENCOUNTER — Other Ambulatory Visit: Payer: Self-pay

## 2023-01-21 ENCOUNTER — Ambulatory Visit
Admission: EM | Admit: 2023-01-21 | Discharge: 2023-01-21 | Disposition: A | Discharge status: Home / Self Care | Payer: Commercial Managed Care - PPO | Attending: Family Medicine | Admitting: Family Medicine

## 2023-01-21 DIAGNOSIS — H6691 Otitis media, unspecified, right ear: Secondary | ICD-10-CM

## 2023-01-21 DIAGNOSIS — J0100 Acute maxillary sinusitis, unspecified: Secondary | ICD-10-CM | POA: Diagnosis not present

## 2023-01-21 MED ORDER — AZITHROMYCIN 250 MG PO TABS: 250.0000 mg | ORAL_TABLET | Freq: Every day | ORAL | 0 refills | Status: DC

## 2023-01-21 MED ORDER — PREDNISONE 20 MG PO TABS: ORAL_TABLET | ORAL | 0 refills | Status: DC

## 2023-01-21 NOTE — ED Provider Notes (Signed)
[facility]    CSN: [id number] Arrival date & time: 01/21/23  1452      History   Chief Complaint Chief Complaint  Patient presents with   Otalgia   Facial Pain    HPI Brittney Mays is a 37 y.o. female.   HPI 37 year old female presents with sore throat, nasal congestion, earaches and coughing spells for 5 days.  Patient is currently breast-feeding with baby having ear infection.  PMH significant for anxiety, myofascial pain, and acid reflux.  Past Medical History:  Diagnosis Date   Chronic back pain    Family history of nonmelanoma skin cancer    Knee pain     Patient Active Problem List   Diagnosis Date Noted   Strep pharyngitis 09/21/2022   Abnormal lochia 09/08/2022   Swelling of eyelid 09/08/2022   Urticaria 07/05/2022   Acute blood loss anemia 05/22/2022   Primigravida of advanced maternal age in third trimester 04/30/2022   Infertility, female 06/14/2021   Environmental allergies 02/10/2021   Acid reflux 02/10/2021   Genetic testing 07/12/2020   Family history of genetic disease 07/02/2020   Family history of nonmelanoma skin cancer    Paresthesias 04/15/2019   Myofascial pain 04/15/2019   Anxiety state 02/12/2018   Mild scoliosis 07/23/2015    Past Surgical History:  Procedure Laterality Date   WISDOM TOOTH EXTRACTION      OB History     Gravida  1   Para  1   Term  1   Preterm  0   AB  0   Living  1      SAB  0   IAB  0   Ectopic  0   Multiple  0   Live Births  1            Home Medications    Prior to Admission medications   Medication Sig Start Date End Date Taking? Authorizing Provider  azithromycin (ZITHROMAX) 250 MG tablet Take 1 tablet (250 mg total) by mouth daily. Take first 2 tablets together, then 1 every day until finished. 01/21/23  Yes Trevor Iha, FNP  Bacillus Coagulans-Inulin (PROBIOTIC) 1-250 BILLION-MG CAPS  09/30/22  Yes [provider]  Cetirizine HCl 10 MG CAPS Take 10  mg by mouth daily at 6 (six) AM. 05/26/22  Yes [provider]  fluticasone (FLONASE) 50 MCG/ACT nasal spray Place 1 spray into both nostrils daily. 05/26/22  Yes [provider]  predniSONE (DELTASONE) 20 MG tablet Take 3 tabs PO daily x 5 days. 01/21/23  Yes Trevor Iha, FNP  Prenatal Vit-Fe Fumarate-FA (MULTIVITAMIN-PRENATAL) 27-0.8 MG TABS tablet Take 1 tablet by mouth daily at 12 noon.   Yes [provider]  Calcium Carbonate Antacid (TUMS E-X PO) Take by mouth.    [provider]  diphenhydrAMINE (BENADRYL ALLERGY) 25 mg capsule Take 25 mg by mouth. 10/23/22   [provider]  famotidine (PEPCID) 20 MG tablet Take 20 mg by mouth. 10/23/22   [provider]  ibuprofen (ADVIL) 200 MG tablet Take 200 mg by mouth every 6 (six) hours as needed for mild pain.    [provider]    Family History Family History  Problem Relation Age of Onset   Hypertension Mother    Other Mother        acoustic neuroma   Cancer Sister        Gorlin syndrome   Leukemia Paternal Uncle 20   Depression Maternal Grandmother  Leukemia Maternal Grandmother 14   Other Maternal Grandmother        'forked ribs', ? gorlin syndrome   Diabetes Paternal Grandfather    Other Cousin        [name]   Down syndrome Cousin    Neuropathy Neg Hx     Social History Social History   Tobacco Use   Smoking status: Never   Smokeless tobacco: Never  Vaping Use   Vaping status: Never Used  Substance Use Topics   Alcohol use: Not Currently    Alcohol/week: 2.0 standard drinks of alcohol    Types: 2 Standard drinks or equivalent per week   Drug use: Never     Allergies   Amoxicillin and Cephalosporins   Review of Systems Review of Systems   Physical Exam Triage Vital Signs ED Triage Vitals [01/21/23 1514]  Encounter Vitals Group     BP      Systolic BP Percentile      Diastolic BP Percentile      Pulse      Resp      Temp       Temp src      SpO2      Weight      Height      Head Circumference      Peak Flow      Pain Score 3     Pain Loc      Pain Education      Exclude from Growth Chart    No data found.  Updated Vital Signs BP 111/73 (BP Location: Right Arm)   Pulse 86   Temp 98.8 F (37.1 C) (Oral)   Resp 16   SpO2 96%   Breastfeeding Yes    Physical Exam Vitals and nursing note reviewed.  Constitutional:      Appearance: Normal appearance. She is normal weight. She is ill-appearing.  HENT:     Head: Normocephalic and atraumatic.     Right Ear: External ear normal.     Left Ear: Tympanic membrane and external ear normal.     Ears:     Comments: Right TM: erythematous, bulging; moderate eustachian tube dysfunction noted bilaterally    Nose:     Comments: Turbinates are erythematous/edematous    Mouth/Throat:     Mouth: Mucous membranes are moist.     Pharynx: Oropharynx is clear.  Eyes:     Extraocular Movements: Extraocular movements intact.     Conjunctiva/sclera: Conjunctivae normal.     Pupils: Pupils are equal, round, and reactive to light.  Cardiovascular:     Rate and Rhythm: Normal rate and regular rhythm.     Pulses: Normal pulses.     Heart sounds: Normal heart sounds.  Pulmonary:     Effort: Pulmonary effort is normal.     Breath sounds: Normal breath sounds. No wheezing, rhonchi or rales.  Abdominal:     General: Bowel sounds are normal.  Musculoskeletal:        General: Normal range of motion.     Cervical back: Normal range of motion and neck supple.  Skin:    General: Skin is warm and dry.  Neurological:     General: No focal deficit present.     Mental Status: She is alert and oriented to person, place, and time. Mental status is at baseline.  Psychiatric:        Mood and Affect: Mood normal.        Behavior:  Behavior normal.      UC Treatments / Results  Labs (all labs ordered are listed, but only abnormal results are displayed) Labs Reviewed - No data  to display  EKG   Radiology No results found.  Procedures Procedures (including critical care time)  Medications Ordered in UC Medications - No data to display  Initial Impression / Assessment and Plan / UC Course  I have reviewed the triage vital signs and the nursing notes.  Pertinent labs & imaging results that were available during my care of the patient were reviewed by me and considered in my medical decision making (see chart for details).     MDM: 1.  Right otitis media-Zithromax (500 mg day 1, then 250 mg daily x 4 days; 2.  Subacute maxillary sinusitis-Rx'd Zithromax (500 mg day 1, then 250 mg daily x 4 days), Rx'd prednisone 60 mg daily x 5 days. Instructed patient to take medications as directed with food to completion.  Advised may take prednisone with Zithromax daily for 5 days.  Advised patient to hold breast-feeding for 4 hours after dose of prednisone.  Encouraged increase daily water intake to 64 ounces per day.  Advised if symptoms worsen and/or unresolved please follow-up with PCP, ENT, or here for further evaluation.  Patient discharged home, hemodynamically stable. Final Clinical Impressions(s) / UC Diagnoses   Final diagnoses:  Acute right otitis media  Subacute maxillary sinusitis     Discharge Instructions      Instructed patient to take medications as directed with food to completion.  Advised may take prednisone with Zithromax daily for 5 days.  Advised patient to hold breast-feeding for 4 hours after dose of prednisone.  Encouraged increase daily water intake to 64 ounces per day.  Advised if symptoms worsen and/or unresolved please follow-up with PCP, ENT, or here for further evaluation.     ED Prescriptions     Medication Sig Dispense Auth. Provider   azithromycin (ZITHROMAX) 250 MG tablet Take 1 tablet (250 mg total) by mouth daily. Take first 2 tablets together, then 1 every day until finished. 6 tablet Trevor Iha, FNP   predniSONE  (DELTASONE) 20 MG tablet Take 3 tabs PO daily x 5 days. 15 tablet Trevor Iha, FNP      PDMP not reviewed this encounter.   Trevor Iha, FNP 01/21/23 1606

## 2023-01-21 NOTE — ED Triage Notes (Signed)
Patient presents to Urgent Care with complaints of sore throat, nasal congestion, earache, coughing spells since 5 days ago. Patient reports taking Tylenol last night. Currently breastfeeding. Baby has ear infection. Had covid about 3 weeks ago.

## 2023-01-21 NOTE — Discharge Instructions (Addendum)
Instructed patient to take medications as directed with food to completion.  Advised may take prednisone with Zithromax daily for 5 days.  Advised patient to hold breast-feeding for 4 hours after dose of prednisone.  Encouraged increase daily water intake to 64 ounces per day.  Advised if symptoms worsen and/or unresolved please follow-up with PCP, ENT, or here for further evaluation.

## 2023-01-25 ENCOUNTER — Encounter: Payer: Commercial Managed Care - PPO | Admitting: Physical Therapy

## 2023-02-01 ENCOUNTER — Encounter: Payer: Commercial Managed Care - PPO | Attending: Advanced Practice Midwife | Admitting: Physical Therapy

## 2023-02-01 ENCOUNTER — Encounter: Payer: Self-pay | Admitting: Physical Therapy

## 2023-02-01 DIAGNOSIS — M6281 Muscle weakness (generalized): Secondary | ICD-10-CM | POA: Diagnosis present

## 2023-02-01 DIAGNOSIS — R102 Pelvic and perineal pain: Secondary | ICD-10-CM | POA: Insufficient documentation

## 2023-02-01 NOTE — Therapy (Signed)
OUTPATIENT PHYSICAL THERAPY FEMALE PELVIC TREATMENT   Patient Name: Brittney Mays MRN: [id number] DOB:14-Jun-1985, 37 y.o., female Today's Date: 02/01/2023  END OF SESSION:  PT End of Session - 02/01/23 0840     Visit Number 10    Date for PT Re-Evaluation 02/27/23    Authorization Type [employer]    Authorization - Visit Number 10    Authorization - Number of Visits 30    PT Start Time 0830    PT Stop Time 0915    PT Time Calculation (min) 45 min    Activity Tolerance Patient tolerated treatment well    Behavior During Therapy [facility] for tasks assessed/performed             Past Medical History:  Diagnosis Date   Chronic back pain    Family history of nonmelanoma skin cancer    Knee pain    Past Surgical History:  Procedure Laterality Date   WISDOM TOOTH EXTRACTION     Patient Active Problem List   Diagnosis Date Noted   Strep pharyngitis 09/21/2022   Abnormal lochia 09/08/2022   Swelling of eyelid 09/08/2022   Urticaria 07/05/2022   Acute blood loss anemia 05/22/2022   Primigravida of advanced maternal age in third trimester 04/30/2022   Infertility, female 06/14/2021   Environmental allergies 02/10/2021   Acid reflux 02/10/2021   Genetic testing 07/12/2020   Family history of genetic disease 07/02/2020   Family history of nonmelanoma skin cancer    Paresthesias 04/15/2019   Myofascial pain 04/15/2019   Anxiety state 02/12/2018   Mild scoliosis 07/23/2015    PCP: Everrett Coombe, DO  REFERRING PROVIDER: Hurshel Party, CNM    REFERRING DIAG:  [phone] (ICD-10-CM) - Postpartum pain  R10.2 (ICD-10-CM) - Pelvic pain in female    THERAPY DIAG:  Muscle weakness (generalized)  Pelvic pain  Rationale for Evaluation and Treatment: Rehabilitation  ONSET DATE: 05/20/22  SUBJECTIVE:                                                                                                                                                                                            SUBJECTIVE STATEMENT: I have not been there due to having COVID, then virus then double ear infection and sinus infection. Now I am having the heaviest period I have every had. I had a bad cough and leaked a lot of urine.  I have been doing my pelvic floor massages. I get sore in the coccyx after sitting. I am not having the abdominal pain. I have not had the coccyx pain.    PAIN:  Are you having pain?  Yes NPRS scale: 0/10, 02/01/23 Pain location: Coccyx  Pain type: dull Pain description: intermittent   Aggravating factors: sitting, sit to stand Relieving factors: continues till it eases off  PRECAUTIONS: None  WEIGHT BEARING RESTRICTIONS: No  FALLS:  Has patient fallen in last 6 months? No  LIVING ENVIRONMENT: Lives with: lives with their family  OCCUPATION: sitting, some days on feet all day  PLOF: Independent  PATIENT GOALS: reduce pain in lower abdomen and intercourse, reduce leakage  PERTINENT HISTORY:  Vaginal delivery 05/20/22 Sexual abuse: No  BOWEL MOVEMENT: No issues  URINATION: Pain with urination: No Fully empty bladder: Yes:   Stream: Strong Urgency: No Frequency: drinking a lot of water to keep milk supply up; breast feeding Leakage:no leakage Pads: No, patient still having discharge so wears a pad  INTERCOURSE: Pain with intercourse: Initial Penetration, During Penetration, and Pain Interrupts Intercourse Ability to have vaginal penetration:  Yes: skin feel stiff Climax: yes Marinoff Scale: 2/3  PREGNANCY: Vaginal deliveries 1 Tearing Yes: second degree, 1 in perineum and 1 in vaginal wall C-section deliveries 0 Currently pregnant No  PROLAPSE: None   OBJECTIVE:   DIAGNOSTIC FINDINGS:  none   COGNITION: Overall cognitive status: Within functional limits for tasks assessed     SENSATION: Light touch: Appears intact Proprioception: Appears intact   POSTURE: No Significant postural limitations  PELVIC  ALIGNMENT:  LUMBARAROM/PROM:  A/PROM A/PROM  eval 11/14/22 12/26/22 02/01/23  Flexion Tightness in the lumbar full Full with decreased movement at T12 and L5 full  Extension Decreased by 25% Decreased by 25% Decreased by 25% Decreased by 25%  Right lateral flexion full full Decreased by 25% Decreased by 25%  Left lateral flexion full full Decreased by 25% Decreased by 25%  Right rotation Decreased by 25% full Decreased by 25% Decreased by 25%  Left rotation Decreased by 25% full Decreased by 25% Decreased by 25%   (Blank rows = not tested)  LOWER EXTREMITY ROM: bilateral hip ROM is full   LOWER EXTREMITY MMT:  MMT Right eval Left eval Right 11/28/22 Left  11/28/22  Hip extension 5/5 5/5 5/5 5/5  Hip abduction 4/5 4/5 4+/5 3+/5  Hip adduction 5/5 4/5 5/5 5/5   PALPATION:   General  decreased opening of the lower rib cage, tension in lower abdominal                External Perineal Exam firmness along the levator ani and perineal body                             Internal Pelvic Floor redness at the posterior introitus and left side of labia where possible tears, tightness along the sides of the introitus and posterior vaginal canal  Patient confirms identification and approves PT to assess internal pelvic floor and treatment Yes  PELVIC MMT:   MMT eval 09/19/22 09/26/22 11/07/22  Vaginal 2/5 anterior and posterior; 1/5 laterally  3/5 with weak hug of therapist finger 4/5 after manual work and VC to lift th pelvic floor  Diastasis Recti 2 finger width above umbilicus, 1 finger below umbilicus 1 finger width above and below umilicus  1 finger width above and below umilicus          TONE: increased  PROLAPSE: none  TODAY'S TREATMENT:   02/01/23 Manual: Soft tissue mobilization: To assess for dry needling Manual work to bilateral lumbar paraspinals and quadratus to elongate after dry needling Trigger  Point Dry-Needling  Treatment instructions: Expect mild to moderate muscle  soreness. S/S of pneumothorax if dry needled over a lung field, and to seek immediate medical attention should they occur. Patient verbalized understanding of these instructions and education.  Patient Consent Given: Yes Education handout provided: Previously provided Muscles treated:  lumbar and lower thoracic multifidi  Electrical stimulation performed: No Parameters: N/A Treatment response/outcome: trigger point response, elongation of muscles Exercises: Stretches/mobility: Marjo Bicker pose breathing into the back to expand as the therapist is doing manual work on the paraspinals Cat cow to increased vertebrae mobility Prone press up half way 5 times Strengthening: Dead bug focusing on the lower abdominal contraction 20 x  Quadruped lift opposite extremity 10x each with tactile cues to abdomen to contract    12/26/22 Manual: Soft tissue mobilization: To assess for dry needling Manual work to bilateral lumbar paraspinals and quadratus Myofascial release: Using the suction cup to release the lumbar and sacral area Spinal mobilization: Mobilization to T10 to L5 to gap the vertebrae bilateral on side Mobilization of T10 -L1 in sitting with trunk rotation and sidebending  Gapping of L5 and S1  Mobilization of sacrum Trigger Point Dry-Needling  Treatment instructions: Expect mild to moderate muscle soreness. S/S of pneumothorax if dry needled over a lung field, and to seek immediate medical attention should they occur. Patient verbalized understanding of these instructions and education.  Patient Consent Given: Yes Education handout provided: Previously provided Muscles treated:  lumbar and lower thoracic multifidi  Electrical stimulation performed: No Parameters: N/A Treatment response/outcome: trigger point response, elongation of muscles Exercises: Stretches/mobility: Standing trunk flexion with one leg on chair Childs pose to stretch the lumbar sacral region     12/19/22 Manual: Soft tissue mobilization: To assess for dry needling Manual work to the perineal body, levator ani, superior transverse, and release of the urogenital diaphragm Internal pelvic floor techniques: No emotional/communication barriers or cognitive limitation. Patient is motivated to learn. Patient understands and agrees with treatment goals and plan. PT explains patient will be examined in standing, sitting, and lying down to see how their muscles and joints work. When they are ready, they will be asked to remove their underwear so PT can examine their perineum. The patient is also given the option of providing their own chaperone as one is not provided in our facility. The patient also has the right and is explained the right to defer or refuse any part of the evaluation or treatment including the internal exam. With the patient's consent, PT will use one gloved finger to gently assess the muscles of the pelvic floor, seeing how well it contracts and relaxes and if there is muscle symmetry. After, the patient will get dressed and PT and patient will discuss exam findings and plan of care. PT and patient discuss plan of care, schedule, attendance policy and HEP activities. Going through the vaginal canal working on the levator ani, perineal body, pubovaginalis, puborectalis and posterior vaginal canal Educated patient on how to perform her own manual work to the perineal body and posterior vaginal canal  Trigger Point Dry-Needling  Treatment instructions: Expect mild to moderate muscle soreness. S/S of pneumothorax if dry needled over a lung field, and to seek immediate medical attention should they occur. Patient verbalized understanding of these instructions and education.  Patient Consent Given: Yes Education handout provided: Previously provided Muscles treated:  levator ani, obturator internist, perineal body,  superior transverse perineum, ischiocavernosus  Electrical stimulation  performed: No Parameters: N/A Treatment response/outcome:  trigger point response, elongation of muscles      PATIENT EDUCATION: 11/14/22 Education details: Access Code: [email or web address], information on dry needling, how to massage the perineum Person educated: Patient Education method: Explanation, Demonstration, Tactile cues, Verbal cues, and Handouts Education comprehension: verbalized understanding, returned demonstration, verbal cues required, tactile cues required, and needs further education  HOME EXERCISE PROGRAM: 11/14/22 Access Code: [email or web address] URL: [email or web address] Date: 11/14/2022 Prepared by: Eulis Foster  Exercises - - Supine Shoulder Horizontal Abduction with Resistance  - 1 x daily - 3 x weekly - 2 sets - 10 reps - Dead Bug  - 1 x daily - 3 x weekly - 2 sets - 10 reps - Quadruped Pelvic Floor Contraction with Opposite Arm and Leg Lift  - 1 x daily - 3 x weekly - 2 sets - 10 reps  ASSESSMENT:  CLINICAL IMPRESSION: Patient is a 37 y.o. female who was seen today for physical therapy  treatment for pelvic pain.   Leaking with coughing. I am less sensitive with husband touching the perineal area but have not had penile penetration vaginally. .Patient has not had coccyx or lower abdominal pain since last visit.  Patient has decreased mobility of L3-S1 and T10-T12. Patient has not had back pain just tightness. She has fascial tightness in the same areas reducing spinal  motion. No internal work to pelvic floor muscles due to being on her cycle. She still has tightness in the thoracic lumbar area. She needs verbal cues to engage the lower abdominals. Patient will benefit from skilled therapy to improve tissue mobility, reduce pain and reduce urinary leakage.   OBJECTIVE IMPAIRMENTS: decreased coordination, decreased strength, increased fascial restrictions, increased muscle spasms, and pain.   ACTIVITY LIMITATIONS: continence and coughing, sneezing  PARTICIPATION  LIMITATIONS: interpersonal relationship and community activity  PERSONAL FACTORS: Time since onset of injury/illness/exacerbation and 1 comorbidity: vaginal birth 05/20/22  are also affecting patient's functional outcome.   REHAB POTENTIAL: Excellent  CLINICAL DECISION MAKING: Stable/uncomplicated  EVALUATION COMPLEXITY: Low   GOALS: Goals reviewed with patient? Yes  SHORT TERM GOALS: Target date: 10/09/22  Patient independent with initial HEP for pelvic floor  and core strength.  Baseline: Goal status: Met 10/03/22  2.  Patient independent with perineal massage to improve tissue mobility.  Baseline:  Goal status: Met 09/19/22  3.  Patient reports her lower abdominal pain decreased </= 25% due to improved lower abdominal strength.  Baseline:  Goal status: Met 09/26/22   LONG TERM GOALS: Target date: 1029/24  Patient independent with advanced HEP for core, and pelvic floor to reduce pain and improve urinary  leakage.  Baseline:  Goal status: ongoing 02/01/23  2.  Patient is able to cough and sneeze with minimal to no lower abdominal pain due to improved lower abdominal contraction and improved tension in diastasis recti.  Baseline:   Goal status: Met 11/28/22  3.  Patient reports she has no urinary leakage with coughing and sneezing due to improved circular contraction of the pelvic floor and strength >/= 3/5.  Baseline: Had 1 big sneeze and leaked a little urine Goal status: ongoing 02/01/23  4.  Marinoff score with penile penetration vaginally </= 1/3 due to time improved mobility of the vaginal tissue.  Baseline: less sensitive with foreplay, no penile penetration vaginally due to being sick Goal status: ongoing 02/01/23   PLAN:  PT FREQUENCY: 1x/week  PT DURATION: 12 weeks  PLANNED INTERVENTIONS: Therapeutic exercises, Therapeutic activity, Neuromuscular re-education, Patient/Family education, Joint mobilization, Dry Needling, Electrical  stimulation, Spinal  mobilization, scar mobilization, Ultrasound, Biofeedback, and Manual therapy  PLAN FOR NEXT SESSION:  dry needling perineal body or lumbar,  manual work to  pelvic floor and check strength. Work on quick contractions  Eulis Foster, PT 02/01/23 9:26 AM

## 2023-02-08 ENCOUNTER — Encounter: Payer: Commercial Managed Care - PPO | Admitting: Physical Therapy

## 2023-02-15 ENCOUNTER — Encounter: Payer: Commercial Managed Care - PPO | Admitting: Physical Therapy

## 2023-02-20 ENCOUNTER — Encounter: Payer: Self-pay | Admitting: Physical Therapy

## 2023-02-20 ENCOUNTER — Encounter: Payer: Commercial Managed Care - PPO | Admitting: Physical Therapy

## 2023-02-20 DIAGNOSIS — M6281 Muscle weakness (generalized): Secondary | ICD-10-CM

## 2023-02-20 DIAGNOSIS — R102 Pelvic and perineal pain: Secondary | ICD-10-CM

## 2023-02-20 NOTE — Therapy (Signed)
OUTPATIENT PHYSICAL THERAPY FEMALE PELVIC TREATMENT   Patient Name: Brittney Mays MRN: [id number] DOB:[birth year], 37 y.o., female Today's Date: 02/20/2023  END OF SESSION:  PT End of Session - 02/20/23 0838     Visit Number 11    Date for PT Re-Evaluation 02/27/23    Authorization Type [employer]    Authorization - Visit Number 11    Authorization - Number of Visits 30    PT Start Time 0830    PT Stop Time 0915    PT Time Calculation (min) 45 min    Activity Tolerance Patient tolerated treatment well    Behavior During Therapy [facility] for tasks assessed/performed             Past Medical History:  Diagnosis Date   Chronic back pain    Family history of nonmelanoma skin cancer    Knee pain    Past Surgical History:  Procedure Laterality Date   WISDOM TOOTH EXTRACTION     Patient Active Problem List   Diagnosis Date Noted   Strep pharyngitis 09/21/2022   Abnormal lochia 09/08/2022   Swelling of eyelid 09/08/2022   Urticaria 07/05/2022   Acute blood loss anemia 05/22/2022   Primigravida of advanced maternal age in third trimester 04/30/2022   Infertility, female 06/14/2021   Environmental allergies 02/10/2021   Acid reflux 02/10/2021   Genetic testing 07/12/2020   Family history of genetic disease 07/02/2020   Family history of nonmelanoma skin cancer    Paresthesias 04/15/2019   Myofascial pain 04/15/2019   Anxiety state 02/12/2018   Mild scoliosis 07/23/2015    PCP: Everrett Coombe, DO  REFERRING PROVIDER: Hurshel Party, CNM    REFERRING DIAG:  [phone] (ICD-10-CM) - Postpartum pain  R10.2 (ICD-10-CM) - Pelvic pain in female    THERAPY DIAG:  Muscle weakness (generalized)  Pelvic pain  Rationale for Evaluation and Treatment: Rehabilitation  ONSET DATE: 05/20/22  SUBJECTIVE:                                                                                                                                                                                            SUBJECTIVE STATEMENT: No big issues. I am less sensitive now. No leakage. No coccyx pain. I have not been there due to having COVID, then virus then double ear infection and sinus infection. Now I am having the heaviest period I have every had. I had a bad cough and leaked a lot of urine.  I have been doing my pelvic floor massages. I get sore in the coccyx after sitting. I am not having the abdominal pain. I have not  had the coccyx pain.    PAIN:  Are you having pain? Yes NPRS scale: 0/10, 02/01/23 Pain location: Coccyx  Pain type: dull Pain description: intermittent   Aggravating factors: sitting, sit to stand Relieving factors: continues till it eases off  PRECAUTIONS: None  WEIGHT BEARING RESTRICTIONS: No  FALLS:  Has patient fallen in last 6 months? No  LIVING ENVIRONMENT: Lives with: lives with their family  OCCUPATION: sitting, some days on feet all day  PLOF: Independent  PATIENT GOALS: reduce pain in lower abdomen and intercourse, reduce leakage  PERTINENT HISTORY:  Vaginal delivery 05/20/22 Sexual abuse: No  BOWEL MOVEMENT: No issues  URINATION: Pain with urination: No Fully empty bladder: Yes:   Stream: Strong Urgency: No Frequency: drinking a lot of water to keep milk supply up; breast feeding Leakage:no leakage Pads: No, patient still having discharge so wears a pad  INTERCOURSE: Pain with intercourse: Initial Penetration, During Penetration, and Pain Interrupts Intercourse Ability to have vaginal penetration:  Yes: skin feel stiff Climax: yes Marinoff Scale: 2/3  PREGNANCY: Vaginal deliveries 1 Tearing Yes: second degree, 1 in perineum and 1 in vaginal wall C-section deliveries 0 Currently pregnant No  PROLAPSE: None   OBJECTIVE:   DIAGNOSTIC FINDINGS:  none   COGNITION: Overall cognitive status: Within functional limits for tasks assessed     SENSATION: Light touch: Appears intact Proprioception: Appears  intact   POSTURE: No Significant postural limitations  PELVIC ALIGNMENT:  LUMBARAROM/PROM:  A/PROM A/PROM  eval 11/14/22 12/26/22 02/01/23  Flexion Tightness in the lumbar full Full with decreased movement at T12 and L5 full  Extension Decreased by 25% Decreased by 25% Decreased by 25% Decreased by 25%  Right lateral flexion full full Decreased by 25% Decreased by 25%  Left lateral flexion full full Decreased by 25% Decreased by 25%  Right rotation Decreased by 25% full Decreased by 25% Decreased by 25%  Left rotation Decreased by 25% full Decreased by 25% Decreased by 25%   (Blank rows = not tested)  LOWER EXTREMITY ROM: bilateral hip ROM is full   LOWER EXTREMITY MMT:  MMT Right eval Left eval Right 11/28/22 Left  11/28/22  Hip extension 5/5 5/5 5/5 5/5  Hip abduction 4/5 4/5 4+/5 3+/5  Hip adduction 5/5 4/5 5/5 5/5   PALPATION:   General  decreased opening of the lower rib cage, tension in lower abdominal                External Perineal Exam firmness along the levator ani and perineal body                             Internal Pelvic Floor redness at the posterior introitus and left side of labia where possible tears, tightness along the sides of the introitus and posterior vaginal canal  Patient confirms identification and approves PT to assess internal pelvic floor and treatment Yes  PELVIC MMT:   MMT eval 09/19/22 09/26/22 11/07/22 02/20/23  Vaginal 2/5 anterior and posterior; 1/5 laterally  3/5 with weak hug of therapist finger 4/5 after manual work and VC to lift th pelvic floor 4/5  Diastasis Recti 2 finger width above umbilicus, 1 finger below umbilicus 1 finger width above and below umilicus  1 finger width above and below umilicus           TONE: increased  PROLAPSE: none  TODAY'S TREATMENT:   02/20/23 Manual: Soft tissue mobilization: Manual work around  the perineal body working on restricted tissue Internal pelvic floor techniques: No  emotional/communication barriers or cognitive limitation. Patient is motivated to learn. Patient understands and agrees with treatment goals and plan. PT explains patient will be examined in standing, sitting, and lying down to see how their muscles and joints work. When they are ready, they will be asked to remove their underwear so PT can examine their perineum. The patient is also given the option of providing their own chaperone as one is not provided in our facility. The patient also has the right and is explained the right to defer or refuse any part of the evaluation or treatment including the internal exam. With the patient's consent, PT will use one gloved finger to gently assess the muscles of the pelvic floor, seeing how well it contracts and relaxes and if there is muscle symmetry. After, the patient will get dressed and PT and patient will discuss exam findings and plan of care. PT and patient discuss plan of care, schedule, attendance policy and HEP activities.  Manual work along the posterior vaginal canal where there is scar tissue formed to improve the tissue mobility Exercises: Strengthening: Sitting contracting the pelvic floor 10 sec 5 times Quadruped lift opposite arm and leg 20 x  Supine legs at 90/90 moving opposite arm and leg 20 x   02/01/23 Manual: Soft tissue mobilization: To assess for dry needling Manual work to bilateral lumbar paraspinals and quadratus to elongate after dry needling Trigger Point Dry-Needling  Treatment instructions: Expect mild to moderate muscle soreness. S/S of pneumothorax if dry needled over a lung field, and to seek immediate medical attention should they occur. Patient verbalized understanding of these instructions and education.  Patient Consent Given: Yes Education handout provided: Previously provided Muscles treated:  lumbar and lower thoracic multifidi  Electrical stimulation performed: No Parameters: N/A Treatment response/outcome:  trigger point response, elongation of muscles Exercises: Stretches/mobility: Marjo Bicker pose breathing into the back to expand as the therapist is doing manual work on the paraspinals Cat cow to increased vertebrae mobility Prone press up half way 5 times Strengthening: Dead bug focusing on the lower abdominal contraction 20 x  Quadruped lift opposite extremity 10x each with tactile cues to abdomen to contract    12/26/22 Manual: Soft tissue mobilization: To assess for dry needling Manual work to bilateral lumbar paraspinals and quadratus Myofascial release: Using the suction cup to release the lumbar and sacral area Spinal mobilization: Mobilization to T10 to L5 to gap the vertebrae bilateral on side Mobilization of T10 -L1 in sitting with trunk rotation and sidebending  Gapping of L5 and S1  Mobilization of sacrum Trigger Point Dry-Needling  Treatment instructions: Expect mild to moderate muscle soreness. S/S of pneumothorax if dry needled over a lung field, and to seek immediate medical attention should they occur. Patient verbalized understanding of these instructions and education.  Patient Consent Given: Yes Education handout provided: Previously provided Muscles treated:  lumbar and lower thoracic multifidi  Electrical stimulation performed: No Parameters: N/A Treatment response/outcome: trigger point response, elongation of muscles Exercises: Stretches/mobility: Standing trunk flexion with one leg on chair Childs pose to stretch the lumbar sacral region       PATIENT EDUCATION: 02/20/23 Education details: Access Code: [email or web address], information on dry needling, how to massage the perineum Person educated: Patient Education method: Explanation, Demonstration, Tactile cues, Verbal cues, and Handouts Education comprehension: verbalized understanding, returned demonstration, verbal cues required, tactile cues required, and needs further education  HOME EXERCISE  PROGRAM: 02/20/23 Access Code: [email or web address] URL: [email or web address] Date: 02/20/2023 Prepared by: Eulis Foster  Exercises - Dead Bug  - 1 x daily - 3 x weekly - 2 sets - 10 reps - Quadruped Pelvic Floor Contraction with Opposite Arm and Leg Lift  - 1 x daily - 3 x weekly - 2 sets - 10 reps - Seated Pelvic Floor Contraction  - 3 x daily - 7 x weekly - 1 sets - 5 reps - 10 sec hold  ASSESSMENT:  CLINICAL IMPRESSION: Patient is a 37 y.o. female who was seen today for physical therapy  treatment for pelvic pain.  No urinary leakage. She has less sensitivity in the vaginal area. She has some scar tissue in the posterior vaginal canal and understands how to continue to lengthen it with manual work. Pelvic floor strength is 4/5. She is able to engage her abdominals correctly. She is able to have penile penetration vaginally. Her diastasis rect is 1 finger width. Patient is independent with her HEP. She is ready for discharge.   OBJECTIVE IMPAIRMENTS: decreased coordination, decreased strength, increased fascial restrictions, increased muscle spasms, and pain.   ACTIVITY LIMITATIONS: continence and coughing, sneezing  PARTICIPATION LIMITATIONS: interpersonal relationship and community activity  PERSONAL FACTORS: Time since onset of injury/illness/exacerbation and 1 comorbidity: vaginal birth 05/20/22  are also affecting patient's functional outcome.   REHAB POTENTIAL: Excellent  CLINICAL DECISION MAKING: Stable/uncomplicated  EVALUATION COMPLEXITY: Low   GOALS: Goals reviewed with patient? Yes  SHORT TERM GOALS: Target date: 10/09/22  Patient independent with initial HEP for pelvic floor  and core strength.  Baseline: Goal status: Met 10/03/22  2.  Patient independent with perineal massage to improve tissue mobility.  Baseline:  Goal status: Met 09/19/22  3.  Patient reports her lower abdominal pain decreased </= 25% due to improved lower abdominal strength.  Baseline:   Goal status: Met 09/26/22   LONG TERM GOALS: Target date: 1029/24  Patient independent with advanced HEP for core, and pelvic floor to reduce pain and improve urinary  leakage.  Baseline:  Goal status: Met 02/20/23  2.  Patient is able to cough and sneeze with minimal to no lower abdominal pain due to improved lower abdominal contraction and improved tension in diastasis recti.  Baseline:   Goal status: Met 11/28/22  3.  Patient reports she has no urinary leakage with coughing and sneezing due to improved circular contraction of the pelvic floor and strength >/= 3/5.  Baseline:  Goal status: met 02/20/23  4.  Marinoff score with penile penetration vaginally </= 1/3 due to time improved mobility of the vaginal tissue.  Baseline:  Goal status: Met 02/20/23   PLAN: Discharge to HEP this visit   Eulis Foster, PT 02/20/23 9:28 AM  PHYSICAL THERAPY DISCHARGE SUMMARY  Visits from Start of Care: 11  Current functional level related to goals / functional outcomes: See above.    Remaining deficits: See above.    Education / Equipment: HEP   Patient agrees to discharge. Patient goals were met. Patient is being discharged due to meeting the stated rehab goals. Thank you for the referral.   Eulis Foster, PT 02/20/23 9:28 AM

## 2023-05-16 ENCOUNTER — Ambulatory Visit (INDEPENDENT_AMBULATORY_CARE_PROVIDER_SITE_OTHER): Payer: Commercial Managed Care - PPO | Admitting: Family Medicine

## 2023-05-16 ENCOUNTER — Encounter: Payer: Self-pay | Admitting: Family Medicine

## 2023-05-16 VITALS — BP 88/59 | HR 88 | Ht 67.5 in | Wt 165.8 lb

## 2023-05-16 DIAGNOSIS — J351 Hypertrophy of tonsils: Secondary | ICD-10-CM | POA: Diagnosis not present

## 2023-05-16 NOTE — Assessment & Plan Note (Signed)
 She does have some tonsillar hypertrophy with intermittent sensation of throat swelling.  Will see if addition of PPI helps.  She will try OTC PPI initially. Recommend ENT referral if not improvement in the next couple of weeks.

## 2023-05-16 NOTE — Patient Instructions (Signed)
 Try over the counter omeprazole or nexium.  Let me know if no improvement over the next couple of weeks.

## 2023-05-16 NOTE — Progress Notes (Signed)
 Brittney Mays - 38 y.o. female MRN [id number]  Date of birth: [birth year]  Subjective Chief Complaint  Patient presents with   sensation of throat /tonsil swelling     Comes and goes x 4 weeks - has recent history of hives - allergist thought might be related to delayed response to abx use.- brought in log of events for review by provider     HPI Brittney Mays is a 38 y.o. female here today with complaint of sensation of throat/tonsil swelling.  This is intermittent in nature and started about 4 weeks ago.   She has not had pain, itching or irritation with this.  She has had some issues with off and on urticaria and has seen an allergist.   Allergist thought this may have possibly been a delayed hypersensitivity to antibiotics.  She is taking cetirizine.   She has had some intermittent reflux symptoms.  She has not had difficulty swallowing or breathing.   ROS:  A comprehensive ROS was completed and negative except as noted per HPI  Allergies  Allergen Reactions   Amoxicillin Hives   Cephalosporins Hives    Past Medical History:  Diagnosis Date   Chronic back pain    Family history of nonmelanoma skin cancer    Knee pain     Past Surgical History:  Procedure Laterality Date   WISDOM TOOTH EXTRACTION      Social History   Socioeconomic History   Marital status: Married    Spouse name: Not on file   Number of children: 0   Years of education: Not on file   Highest education level: Bachelor's degree (e.g., BA, AB, BS)  Occupational History   Not on file  Tobacco Use   Smoking status: Never   Smokeless tobacco: Never  Vaping Use   Vaping status: Never Used  Substance and Sexual Activity   Alcohol use: Not Currently    Alcohol/week: 2.0 standard drinks of alcohol    Types: 2 Standard drinks or equivalent per week   Drug use: Never   Sexual activity: Yes  Other Topics Concern   Not on file  Social History Narrative   Lives at home with husband   Right  handed   Caffeine: 1 cup/day   Social Drivers of Corporate investment banker Strain: Low Risk  (05/14/2023)   Overall Financial Resource Strain (CARDIA)    Difficulty of Paying Living Expenses: Not hard at all  Food Insecurity: No Food Insecurity (05/14/2023)   Hunger Vital Sign    Worried About Running Out of Food in the Last Year: Never true    Ran Out of Food in the Last Year: Never true  Transportation Needs: No Transportation Needs (05/14/2023)   PRAPARE - Administrator, Civil Service (Medical): No    Lack of Transportation (Non-Medical): No  Physical Activity: Insufficiently Active (05/14/2023)   Exercise Vital Sign    Days of Exercise per Week: 2 days    Minutes of Exercise per Session: 20 min  Stress: No Stress Concern Present (05/14/2023)   [name]    Feeling of Stress : Not at all  Social Connections: Socially Isolated (05/14/2023)   Social Connection and Isolation Panel [NHANES]    Frequency of Communication with Friends and Family: Once a week    Frequency of Social Gatherings with Friends and Family: Once a week    Attends Religious Services: Never  Active Member of Clubs or Organizations: No    Attends Engineer, structural: Not on file    Marital Status: Married    Family History  Problem Relation Age of Onset   Hypertension Mother    Other Mother        acoustic neuroma   Cancer Sister        Gorlin syndrome   Leukemia Paternal Uncle 20   Depression Maternal Grandmother    Leukemia Maternal Grandmother 55   Other Maternal Grandmother        'forked ribs', ? gorlin syndrome   Diabetes Paternal Grandfather    Other Cousin        Brittney Mays   Down syndrome Cousin    Neuropathy Neg Hx     Health Maintenance  Topic Date Due   COVID-19 Vaccine (5 - 2024-25 season) 12/31/2022   Cervical Cancer Screening (HPV/Pap Cotest)  11/05/2026   DTaP/Tdap/Td (3 - Td or Tdap)  02/28/2032   INFLUENZA VACCINE  Completed   Hepatitis C Screening  Completed   HIV Screening  Completed   HPV VACCINES  Aged Out     ----------------------------------------------------------------------------------------------------------------------------------------------------------------------------------------------------------------- Physical Exam BP (!) 88/59 (BP Location: Left Arm, Patient Position: Sitting, Cuff Size: Normal)   Pulse 88   Ht 5' 7.5" (1.715 m)   Wt 165 lb 12 oz (75.2 kg)   SpO2 99%   BMI 25.58 kg/m   Physical Exam Constitutional:      Appearance: Normal appearance.  HENT:     Head: Normocephalic and atraumatic.     Mouth/Throat:     Mouth: Mucous membranes are moist.     Comments: Tonsils 2+ without erythema or exudate.  Musculoskeletal:     Cervical back: Neck supple.  Lymphadenopathy:     Cervical: No cervical adenopathy.  Neurological:     Mental Status: She is alert.     ------------------------------------------------------------------------------------------------------------------------------------------------------------------------------------------------------------------- Assessment and Plan  Tonsillar hypertrophy She does have some tonsillar hypertrophy with intermittent sensation of throat swelling.  Will see if addition of PPI helps.  She will try OTC PPI initially. Recommend ENT referral if not improvement in the next couple of weeks.    No orders of the defined types were placed in this encounter.   No follow-ups on file.    This visit occurred during the SARS-CoV-2 public health emergency.  Safety protocols were in place, including screening questions prior to the visit, additional usage of staff PPE, and extensive cleaning of exam room while observing appropriate contact time as indicated for disinfecting solutions.

## 2023-06-02 ENCOUNTER — Encounter: Payer: Self-pay | Admitting: Family Medicine

## 2023-06-02 DIAGNOSIS — J351 Hypertrophy of tonsils: Secondary | ICD-10-CM

## 2023-07-02 ENCOUNTER — Telehealth: Admitting: Nurse Practitioner

## 2023-07-02 DIAGNOSIS — Z20828 Contact with and (suspected) exposure to other viral communicable diseases: Secondary | ICD-10-CM

## 2023-07-02 DIAGNOSIS — R6889 Other general symptoms and signs: Secondary | ICD-10-CM | POA: Diagnosis not present

## 2023-07-02 MED ORDER — OSELTAMIVIR PHOSPHATE 75 MG PO CAPS
75.0000 mg | ORAL_CAPSULE | Freq: Two times a day (BID) | ORAL | 0 refills | Status: AC
Start: 2023-07-02 — End: 2023-07-07

## 2023-07-02 NOTE — Progress Notes (Signed)
 Virtual Visit Consent   Brittney Mays, you are scheduled for a virtual visit with a [facility] provider today. Just as with appointments in the office, your consent must be obtained to participate. Your consent will be active for this visit and any virtual visit you may have with one of our providers in the next 365 days. If you have a MyChart account, a copy of this consent can be sent to you electronically.  As this is a virtual visit, video technology does not allow for your provider to perform a traditional examination. This may limit your provider's ability to fully assess your condition. If your provider identifies any concerns that need to be evaluated in person or the need to arrange testing (such as labs, EKG, etc.), we will make arrangements to do so. Although advances in technology are sophisticated, we cannot ensure that it will always work on either your end or our end. If the connection with a video visit is poor, the visit may have to be switched to a telephone visit. With either a video or telephone visit, we are not always able to ensure that we have a secure connection.  By engaging in this virtual visit, you consent to the provision of healthcare and authorize for your insurance to be billed (if applicable) for the services provided during this visit. Depending on your insurance coverage, you may receive a charge related to this service.  I need to obtain your verbal consent now. Are you willing to proceed with your visit today? Brittney Mays has provided verbal consent on 07/02/2023 for a virtual visit (video or telephone). Brittney Simas, FNP  Date: 07/02/2023 5:40 PM   Virtual Visit via Video Note   I, Brittney Mays, connected with  Brittney Mays  ([id number], [birth year]) on 07/02/23 at  5:45 PM EST by a video-enabled telemedicine application and verified that I am speaking with the correct person using two identifiers.  Location: Patient: Virtual Visit Location  Patient: Home Provider: Virtual Visit Location Provider: Home Office   I discussed the limitations of evaluation and management by telemedicine and the availability of in person appointments. The patient expressed understanding and agreed to proceed.    History of Present Illness: Brittney Mays is a 38 y.o. who identifies as a female who was assigned female at birth, and is being seen today for flu like symptoms   She has a son that was diagnosed with the flu yesterday   The patient started to have symptoms this morning- initially fatigue, she has since developed a fever up to 101.2  She has had a mild headache, post nasal drainage scratchy throat   She is not breastfeeding   Problems:  Patient Active Problem List   Diagnosis Date Noted   Tonsillar hypertrophy 05/16/2023   Strep pharyngitis 09/21/2022   Abnormal lochia 09/08/2022   Swelling of eyelid 09/08/2022   Urticaria 07/05/2022   Acute blood loss anemia 05/22/2022   Primigravida of advanced maternal age in third trimester 04/30/2022   Infertility, female 06/14/2021   Environmental allergies 02/10/2021   Acid reflux 02/10/2021   Genetic testing 07/12/2020   Family history of genetic disease 07/02/2020   Family history of nonmelanoma skin cancer    Paresthesias 04/15/2019   Myofascial pain 04/15/2019   Anxiety state 02/12/2018   Mild scoliosis 07/23/2015    Allergies:  Allergies  Allergen Reactions   Amoxicillin Hives   Cephalosporins Hives   Medications:  Current Outpatient Medications:  Calcium Carbonate Antacid (TUMS E-X PO), Take by mouth., Disp: , Rfl:    Cetirizine HCl 10 MG CAPS, Take 10 mg by mouth daily at 6 (six) AM., Disp: , Rfl:    diphenhydrAMINE (BENADRYL ALLERGY) 25 mg capsule, Take 25 mg by mouth. prn, Disp: , Rfl:    fluticasone (FLONASE) 50 MCG/ACT nasal spray, Place 1 spray into both nostrils daily., Disp: , Rfl:   Observations/Objective: Patient is well-developed, well-nourished in no  acute distress.  Resting comfortably  at home.  Head is normocephalic, atraumatic.  No labored breathing.  Speech is clear and coherent with logical content.  Patient is alert and oriented at baseline.    Assessment and Plan:   1. Flu-like symptoms (Primary)  Continue to treat symptoms as discussed with over the counter medications for relief   Push fluids and assure taking Tamiflu with food to avoid side effects    2. Exposure to the flu     Meds ordered this encounter  Medications   oseltamivir (TAMIFLU) 75 MG capsule    Sig: Take 1 capsule (75 mg total) by mouth 2 (two) times daily for 5 days.    Dispense:  10 capsule    Refill:  0     Follow Up Instructions: I discussed the assessment and treatment plan with the patient. The patient was provided an opportunity to ask questions and all were answered. The patient agreed with the plan and demonstrated an understanding of the instructions.  A copy of instructions were sent to the patient via MyChart unless otherwise noted below.    The patient was advised to call back or seek an in-person evaluation if the symptoms worsen or if the condition fails to improve as anticipated.    Brittney Simas, FNP

## 2023-07-29 ENCOUNTER — Other Ambulatory Visit: Payer: Self-pay

## 2023-07-29 ENCOUNTER — Ambulatory Visit
Admission: RE | Admit: 2023-07-29 | Discharge: 2023-07-29 | Disposition: A | Discharge status: Home / Self Care | Source: Ambulatory Visit | Attending: Family Medicine | Admitting: Family Medicine

## 2023-07-29 MED ORDER — AZITHROMYCIN 250 MG PO TABS: ORAL_TABLET | ORAL | 0 refills | Status: DC

## 2023-07-29 MED ORDER — TOBRAMYCIN 0.3 % OP SOLN: 1.0000 [drp] | Freq: Four times a day (QID) | OPHTHALMIC | 0 refills | Status: DC

## 2023-07-29 NOTE — Discharge Instructions (Signed)
 Start the nasal spray recommended by Ent Drink lots of water Take the antibiotic as directed Use eye drops for 5 days See your doctor if not improving by next week

## 2023-07-29 NOTE — ED Triage Notes (Addendum)
 Sore throat started Wednesday. Fever Thursday evening. Friday started with post nasal drip. Woke up Saturday morning with redness to left eye since wearing disposable contacts. Saturday started having cough. toddler dx with ear infection and pink eye. Had fever for 1 day but does not have any longer. Yesterday took sudafed cold and sinus, tylenol on Thursday and Friday. Has had flu in February and covid in September, does not feel the same.

## 2023-09-16 ENCOUNTER — Encounter: Payer: Self-pay | Admitting: Family Medicine

## 2024-01-24 ENCOUNTER — Ambulatory Visit: Payer: Self-pay

## 2024-01-24 NOTE — Telephone Encounter (Signed)
 FYI Only or Action Required?: FYI only for provider.  Patient was last seen in primary care on 07/02/2023 by Kennyth Domino, FNP.  Called Nurse Triage reporting Fatigue and Viral Infection.  Symptoms began several days ago. 2 days ago  Interventions attempted: Rest, hydration, or home remedies.  Symptoms are: stable.  Triage Disposition: Home Care  Patient/caregiver understands and will follow disposition?: Yes Patient called to discuss care options for HFM virus.  Pt reports no cough or fever but has tiny bumps on her hand that developed 2 days ago and again today in her mouth.  Pt also endorses post-nasal drainage and throat irritation. RN advising symptom management and remain in isolation until no fever for 24 hours and no new or worsening rash for 24 hours.   Copied from CRM [id number]. Topic: Clinical - Medical Advice >> Jan 24, 2024 12:04 PM [name] wrote: Reason for CRM: Patient contracted Hand, Foot, And Mouth from her son. Is looking for guidance on how to treat the symptoms and any information or suggestions that can be provided.  Patient can be reached at [phone] Reason for Disposition  Common cold with no complications    HFM virus in an adult  Answer Assessment - Initial Assessment Questions 1. ONSET: When did the nasal discharge start?     Symptoms started 2 days ago on Tuesday. Pt was exposed to HFM from her son and now has postnasal drainage  2. AMOUNT: How much discharge is there?      Unable to tell exactly how much   3. COUGH: Do you have a cough? If Yes, ask: Describe the color of your mucus. (e.g., clear, white, yellow, green)     No cough  4. RESPIRATORY DISTRESS: Describe your breathing.      No trouble breathing  5. FEVER: Do you have a fever? If Yes, ask: What is your temperature, how was it measured, and when did it start?     No fever  6. SEVERITY: Overall, how bad are you feeling right now? (e.g., doesn't interfere with normal  activities, staying home from school/work, staying in bed)      Feels fatigues, but patient says she is generally tired due to caring for her sick son  7. OTHER SYMPTOMS: Do you have any other symptoms? (e.g., earache, mouth sores, sore throat, wheezing)     Mouth sores, sores on hand, and throat irritation  8. PREGNANCY: Is there any chance you are pregnant? When was your last menstrual period?     .  Protocols used: Common Cold-A-AH

## 2024-02-15 ENCOUNTER — Ambulatory Visit (INDEPENDENT_AMBULATORY_CARE_PROVIDER_SITE_OTHER): Admitting: Family Medicine

## 2024-02-15 ENCOUNTER — Encounter: Payer: Self-pay | Admitting: Family Medicine

## 2024-02-15 VITALS — BP 96/52 | HR 95 | Ht 67.5 in | Wt 161.0 lb

## 2024-02-15 DIAGNOSIS — R110 Nausea: Secondary | ICD-10-CM | POA: Insufficient documentation

## 2024-02-15 MED ORDER — PANTOPRAZOLE SODIUM 40 MG PO TBEC: 40.0000 mg | DELAYED_RELEASE_TABLET | Freq: Every day | ORAL | 3 refills | Status: AC

## 2024-02-15 MED ORDER — ONDANSETRON 8 MG PO TBDP: 8.0000 mg | ORAL_TABLET | Freq: Three times a day (TID) | ORAL | 3 refills | Status: AC | PRN

## 2024-02-15 NOTE — Progress Notes (Signed)
 Brittney Mays - 38 y.o. female MRN [id number]  Date of birth: 1985/06/01  Subjective Chief Complaint  Patient presents with   Nausea    HPI Brittney Mays is a 38 y.o.  female here today with complaint of nausea and decreased appetite.  This started about 3 weeks ago.  Initially was everyday but now more intermittent.  She feels a little better after eating. She did take a home pregnancy test which was negative and recently had her period.  She has had some increased heartburn symptoms, especially if consuming caffeine. She denies abdominal pain, vomiting, fever or chills.  She denies excessive NSAID use.  Bowels are moving normal.  No dark stools.   ROS:  A comprehensive ROS was completed and negative except as noted per HPI  Allergies  Allergen Reactions   Amoxicillin Hives   Cephalosporins Hives    Past Medical History:  Diagnosis Date   Chronic back pain    Family history of nonmelanoma skin cancer    Knee pain     Past Surgical History:  Procedure Laterality Date   WISDOM TOOTH EXTRACTION      Social History   Socioeconomic History   Marital status: Married    Spouse name: Not on file   Number of children: 0   Years of education: Not on file   Highest education level: Bachelor's degree (e.g., BA, AB, BS)  Occupational History   Not on file  Tobacco Use   Smoking status: Never   Smokeless tobacco: Never  Vaping Use   Vaping status: Never Used  Substance and Sexual Activity   Alcohol use: Not Currently    Alcohol/week: 2.0 standard drinks of alcohol    Types: 2 Standard drinks or equivalent per week   Drug use: Never   Sexual activity: Yes  Other Topics Concern   Not on file  Social History Narrative   Lives at home with husband   Right handed   Caffeine: 1 cup/day   Social Drivers of Corporate investment banker Strain: Low Risk  (02/12/2024)   Overall Financial Resource Strain (CARDIA)    Difficulty of Paying Living Expenses: Not hard at  all  Food Insecurity: No Food Insecurity (02/12/2024)   Hunger Vital Sign    Worried About Running Out of Food in the Last Year: Never true    Ran Out of Food in the Last Year: Never true  Transportation Needs: No Transportation Needs (02/12/2024)   PRAPARE - Administrator, Civil Service (Medical): No    Lack of Transportation (Non-Medical): No  Physical Activity: Insufficiently Active (02/12/2024)   Exercise Vital Sign    Days of Exercise per Week: 3 days    Minutes of Exercise per Session: 20 min  Stress: No Stress Concern Present (02/12/2024)   [name]    Feeling of Stress: Only a little  Social Connections: Moderately Isolated (02/12/2024)   Social Connection and Isolation Panel    Frequency of Communication with Friends and Family: Once a week    Frequency of Social Gatherings with Friends and Family: Once a week    Attends Religious Services: 1 to 4 times per year    Active Member of [employer] or Organizations: No    Attends Engineer, structural: Not on file    Marital Status: Married    Family History  Problem Relation Age of Onset   Hypertension Mother  Other Mother        acoustic neuroma   Cancer Sister        Gorlin syndrome   Leukemia Paternal Uncle 20   Depression Maternal Grandmother    Leukemia Maternal Grandmother 26   Other Maternal Grandmother        'forked ribs', ? gorlin syndrome   Diabetes Paternal Grandfather    Other Cousin        [name]   Down syndrome Cousin    Neuropathy Neg Hx     Health Maintenance  Topic Date Due   Influenza Vaccine  07/29/2024 (Originally 11/30/2023)   Hepatitis B Vaccines 19-59 Average Risk (1 of 3 - 19+ 3-dose series) 02/14/2025 (Originally 06/30/2005)   HPV VACCINES (1 - 3-dose SCDM series) 02/14/2025 (Originally 06/30/2013)   COVID-19 Vaccine (7 - 2025-26 season) 03/02/2025 (Originally 12/31/2023)   Cervical Cancer Screening  (HPV/Pap Cotest)  11/05/2026   DTaP/Tdap/Td (4 - Td or Tdap) 02/28/2032   Hepatitis C Screening  Completed   HIV Screening  Completed   Pneumococcal Vaccine  Aged Out   Meningococcal B Vaccine  Aged Out     ----------------------------------------------------------------------------------------------------------------------------------------------------------------------------------------------------------------- Physical Exam BP 102/64 (BP Location: Left Arm, Patient Position: Sitting, Cuff Size: Normal)   Pulse 95   Ht 5' 7.5 (1.715 m)   Wt 161 lb (73 kg)   SpO2 99%   BMI 24.84 kg/m   Physical Exam Constitutional:      Appearance: Normal appearance.  HENT:     Head: Normocephalic and atraumatic.  Eyes:     General: No scleral icterus. Cardiovascular:     Rate and Rhythm: Normal rate and regular rhythm.  Pulmonary:     Effort: Pulmonary effort is normal.     Breath sounds: Normal breath sounds.  Abdominal:     General: Bowel sounds are normal. There is no distension.     Palpations: Abdomen is soft.     Tenderness: There is no abdominal tenderness.  Neurological:     Mental Status: She is alert.  Psychiatric:        Mood and Affect: Mood normal.        Behavior: Behavior normal.     ------------------------------------------------------------------------------------------------------------------------------------------------------------------------------------------------------------------- Assessment and Plan  Nausea She has had some heartburn symptoms which may be contributing.  Checking labs today.  Will provide trial of pantoprazole.  Zofran  odt nausea.    Meds ordered this encounter  Medications   pantoprazole (PROTONIX) 40 MG tablet    Sig: Take 1 tablet (40 mg total) by mouth daily.    Dispense:  30 tablet    Refill:  3   ondansetron  (ZOFRAN -ODT) 8 MG disintegrating tablet    Sig: Take 1 tablet (8 mg total) by mouth every 8 (eight) hours as needed for  nausea.    Dispense:  20 tablet    Refill:  3    No follow-ups on file.

## 2024-02-15 NOTE — Assessment & Plan Note (Signed)
 She has had some heartburn symptoms which may be contributing.  Checking labs today.  Will provide trial of pantoprazole.  Zofran  odt nausea.

## 2024-02-16 LAB — CBC WITH DIFFERENTIAL/PLATELET
Basophils Absolute: 0 x10E3/uL (ref 0.0–0.2)
Basos: 0 %
EOS (ABSOLUTE): 0 x10E3/uL (ref 0.0–0.4)
Eos: 1 %
Hematocrit: 41.5 % (ref 34.0–46.6)
Hemoglobin: 13.5 g/dL (ref 11.1–15.9)
Immature Grans (Abs): 0 x10E3/uL (ref 0.0–0.1)
Immature Granulocytes: 0 %
Lymphocytes Absolute: 1.4 x10E3/uL (ref 0.7–3.1)
Lymphs: 31 %
MCH: 31.3 pg (ref 26.6–33.0)
MCHC: 32.5 g/dL (ref 31.5–35.7)
MCV: 96 fL (ref 79–97)
Monocytes Absolute: 0.3 x10E3/uL (ref 0.1–0.9)
Monocytes: 7 %
Neutrophils Absolute: 2.9 x10E3/uL (ref 1.4–7.0)
Neutrophils: 61 %
Platelets: 222 x10E3/uL (ref 150–450)
RBC: 4.31 x10E6/uL (ref 3.77–5.28)
RDW: 11.8 % (ref 11.7–15.4)
WBC: 4.6 x10E3/uL (ref 3.4–10.8)

## 2024-02-16 LAB — CMP14+EGFR
ALT: 20 IU/L (ref 0–32)
AST: 22 IU/L (ref 0–40)
Albumin: 4.6 g/dL (ref 3.9–4.9)
Alkaline Phosphatase: 51 IU/L (ref 41–116)
BUN/Creatinine Ratio: 13 (ref 9–23)
BUN: 12 mg/dL (ref 6–20)
Bilirubin Total: 0.7 mg/dL (ref 0.0–1.2)
CO2: 24 mmol/L (ref 20–29)
Calcium: 9.5 mg/dL (ref 8.7–10.2)
Chloride: 101 mmol/L (ref 96–106)
Creatinine, Ser: 0.93 mg/dL (ref 0.57–1.00)
Globulin, Total: 2.3 g/dL (ref 1.5–4.5)
Glucose: 87 mg/dL (ref 70–99)
Potassium: 4 mmol/L (ref 3.5–5.2)
Sodium: 140 mmol/L (ref 134–144)
Total Protein: 6.9 g/dL (ref 6.0–8.5)
eGFR: 81 mL/min/1.73 (ref >59)

## 2024-02-16 LAB — LIPASE: Lipase: 34 U/L (ref 14–72)

## 2024-02-16 LAB — TSH: TSH: 2 u[IU]/mL (ref 0.450–4.500)

## 2024-02-22 ENCOUNTER — Ambulatory Visit: Payer: Self-pay | Admitting: Family Medicine

## 2024-02-28 ENCOUNTER — Encounter: Payer: Self-pay | Admitting: Emergency Medicine

## 2024-02-28 ENCOUNTER — Ambulatory Visit
Admission: EM | Admit: 2024-02-28 | Discharge: 2024-02-28 | Disposition: A | Discharge status: Home / Self Care | Attending: Nurse Practitioner | Admitting: Nurse Practitioner

## 2024-02-28 DIAGNOSIS — J0110 Acute frontal sinusitis, unspecified: Secondary | ICD-10-CM

## 2024-02-28 MED ORDER — DOXYCYCLINE HYCLATE 100 MG PO TABS: 100.0000 mg | ORAL_TABLET | Freq: Two times a day (BID) | ORAL | 0 refills | Status: AC

## 2024-02-28 MED ORDER — FLUTICASONE PROPIONATE 50 MCG/ACT NA SUSP: 1.0000 | Freq: Every day | NASAL | 0 refills | Status: AC

## 2024-02-28 NOTE — Discharge Instructions (Signed)
 Take medication as directed. You may continue Sudafed as needed. Increase fluids and get plenty of rest. May take over-the-counter ibuprofen  or Tylenol  as needed for pain, fever, or general discomfort. Recommend normal saline nasal spray to help with nasal congestion throughout the day. If your symptoms fail to improve with this treatment, you may follow-up in this clinic or with your primary care physician for further evaluation. Follow-up as needed.

## 2024-02-28 NOTE — ED Provider Notes (Signed)
 [facility]    CSN: [id number] Arrival date & time: 02/28/24  1536      History   Chief Complaint No chief complaint on file.   HPI Brittney Mays is a 38 y.o. female.   The history is provided by the patient.   Patient presents for complaints of nasal congestion, sinus pressure, and pain in her face and jaw.  Patient states she has had ongoing nasal congestion for almost 2 weeks, she states the pain in her face and jaw and around her teeth started over the past day or so.  She denies fever, chills, headache, ear pain, cough, abdominal pain, nausea, vomiting, diarrhea, or rash.  Patient states she has been taking over-the-counter analgesics, using normal saline nasal spray, and Sudafed for her symptoms with minimal relief.  Past Medical History:  Diagnosis Date   Chronic back pain    Family history of nonmelanoma skin cancer    Knee pain     Patient Active Problem List   Diagnosis Date Noted   Nausea 02/15/2024   Tonsillar hypertrophy 05/16/2023   Strep pharyngitis 09/21/2022   Abnormal lochia 09/08/2022   Swelling of eyelid 09/08/2022   Urticaria 07/05/2022   Acute blood loss anemia 05/22/2022   Primigravida of advanced maternal age in third trimester 04/30/2022   Infertility, female 06/14/2021   Acid reflux 02/10/2021   Genetic testing 07/12/2020   Family history of genetic disease 07/02/2020   Family history of nonmelanoma skin cancer    Paresthesias 04/15/2019   Myofascial pain 04/15/2019   Anxiety state 02/12/2018   Mild scoliosis 07/23/2015    Past Surgical History:  Procedure Laterality Date   WISDOM TOOTH EXTRACTION      OB History     Gravida  1   Para  1   Term  1   Preterm  0   AB  0   Living  1      SAB  0   IAB  0   Ectopic  0   Multiple  0   Live Births  1            Home Medications    Prior to Admission medications   Medication Sig Start Date End Date Taking? Authorizing Provider  doxycycline   (VIBRA -TABS) 100 MG tablet Take 1 tablet (100 mg total) by mouth 2 (two) times daily for 7 days. 02/28/24 03/06/24 Yes Leath-Warren, Etta JINNY, NP  fluticasone (FLONASE) 50 MCG/ACT nasal spray Place 1 spray into both nostrils daily. 02/28/24  Yes Leath-Warren, Etta JINNY, NP  ondansetron  (ZOFRAN -ODT) 8 MG disintegrating tablet Take 1 tablet (8 mg total) by mouth every 8 (eight) hours as needed for nausea. 02/15/24   Alvia Bring, DO  pantoprazole (PROTONIX) 40 MG tablet Take 1 tablet (40 mg total) by mouth daily. 02/15/24   Alvia Bring, DO    Family History Family History  Problem Relation Age of Onset   Hypertension Mother    Other Mother        acoustic neuroma   Cancer Sister        Gorlin syndrome   Leukemia Paternal Uncle 20   Depression Maternal Grandmother    Leukemia Maternal Grandmother 92   Other Maternal Grandmother        'forked ribs', ? gorlin syndrome   Diabetes Paternal Grandfather    Other Cousin        [name]   Down syndrome Cousin    Neuropathy Neg Hx  Social History Social History   Tobacco Use   Smoking status: Never   Smokeless tobacco: Never  Vaping Use   Vaping status: Never Used  Substance Use Topics   Alcohol use: Not Currently    Alcohol/week: 2.0 standard drinks of alcohol    Types: 2 Standard drinks or equivalent per week   Drug use: Never     Allergies   Amoxicillin and Cephalosporins   Review of Systems Review of Systems Per HPI  Physical Exam Triage Vital Signs ED Triage Vitals  Encounter Vitals Group     BP 02/28/24 1549 118/77     Girls Systolic BP Percentile --      Girls Diastolic BP Percentile --      Boys Systolic BP Percentile --      Boys Diastolic BP Percentile --      Pulse Rate 02/28/24 1549 (!) 109     Resp 02/28/24 1549 18     Temp 02/28/24 1549 99.3 F (37.4 C)     Temp Source 02/28/24 1549 Oral     SpO2 02/28/24 1549 96 %     Weight --      Height --      Head Circumference --      Peak  Flow --      Pain Score 02/28/24 1551 4     Pain Loc --      Pain Education --      Exclude from Growth Chart --    No data found.  Updated Vital Signs BP 118/77 (BP Location: Right Arm)   Pulse (!) 109   Temp 99.3 F (37.4 C) (Oral)   Resp 18   LMP 02/03/2024 (Approximate)   SpO2 96%   Visual Acuity Right Eye Distance:   Left Eye Distance:   Bilateral Distance:    Right Eye Near:   Left Eye Near:    Bilateral Near:     Physical Exam Vitals and nursing note reviewed.  Constitutional:      General: She is not in acute distress.    Appearance: Normal appearance.  HENT:     Head: Normocephalic.     Right Ear: Tympanic membrane, ear canal and external ear normal.     Left Ear: Tympanic membrane, ear canal and external ear normal.     Nose: Congestion present.     Right Turbinates: Enlarged and swollen.     Left Turbinates: Enlarged and swollen.     Right Sinus: Frontal sinus tenderness present. No maxillary sinus tenderness.     Left Sinus: Frontal sinus tenderness present. No maxillary sinus tenderness.     Mouth/Throat:     Lips: Pink.     Mouth: Mucous membranes are moist.     Pharynx: Oropharyngeal exudate and postnasal drip present. No pharyngeal swelling, posterior oropharyngeal erythema or uvula swelling.     Comments: Cobblestoning present to posterior oropharynx  Eyes:     Extraocular Movements: Extraocular movements intact.     Conjunctiva/sclera: Conjunctivae normal.     Pupils: Pupils are equal, round, and reactive to light.  Cardiovascular:     Rate and Rhythm: Normal rate and regular rhythm.     Pulses: Normal pulses.     Heart sounds: Normal heart sounds.  Pulmonary:     Effort: Pulmonary effort is normal. No respiratory distress.     Breath sounds: Normal breath sounds. No stridor. No wheezing, rhonchi or rales.  Abdominal:     General: Bowel sounds are  normal.     Palpations: Abdomen is soft.  Musculoskeletal:     Cervical back: Normal range of  motion.  Skin:    General: Skin is warm and dry.  Neurological:     General: No focal deficit present.     Mental Status: She is alert and oriented to person, place, and time.  Psychiatric:        Mood and Affect: Mood normal.        Behavior: Behavior normal.      UC Treatments / Results  Labs (all labs ordered are listed, but only abnormal results are displayed) Labs Reviewed - No data to display  EKG   Radiology No results found.  Procedures Procedures (including critical care time)  Medications Ordered in UC Medications - No data to display  Initial Impression / Assessment and Plan / UC Course  I have reviewed the triage vital signs and the nursing notes.  Pertinent labs & imaging results that were available during my care of the patient were reviewed by me and considered in my medical decision making (see chart for details).  Patient reports upper respiratory symptoms for the past 2 weeks, with new symptoms of facial pain and teeth pain that started today.  She does complain of frontal sinus pressure.  She does have some tenderness noted on exam.  Patient symptoms have been present for 2 weeks, given the duration of her symptoms and symptomatic treatment failure, will treat for bacterial etiology with doxycycline  100 mg.  Will also start patient on fluticasone 50 mcg nasal spray.  Supportive care recommendations were provided and discussed with the patient to include fluids, rest, over-the-counter analgesics, continuing use of normal saline nasal spray, and to monitor for worsening symptoms.  Patient was given indications regarding follow-up.  Patient was in agreement with this plan of care and verbalizes understanding.  All questions were answered.  Patient stable for discharge.   Final Clinical Impressions(s) / UC Diagnoses   Final diagnoses:  Acute frontal sinusitis, recurrence not specified     Discharge Instructions      Take medication as directed. You may  continue Sudafed as needed. Increase fluids and get plenty of rest. May take over-the-counter ibuprofen  or Tylenol  as needed for pain, fever, or general discomfort. Recommend normal saline nasal spray to help with nasal congestion throughout the day. If your symptoms fail to improve with this treatment, you may follow-up in this clinic or with your primary care physician for further evaluation. Follow-up as needed.     ED Prescriptions     Medication Sig Dispense Auth. Provider   doxycycline  (VIBRA -TABS) 100 MG tablet Take 1 tablet (100 mg total) by mouth 2 (two) times daily for 7 days. 14 tablet Leath-Warren, Etta PARAS, NP   fluticasone (FLONASE) 50 MCG/ACT nasal spray Place 1 spray into both nostrils daily. 16 g Leath-Warren, Etta PARAS, NP      PDMP not reviewed this encounter.   Gilmer Etta PARAS, NP 02/28/24 1621

## 2024-02-28 NOTE — ED Triage Notes (Signed)
 Nasal congestion x 2 weeks.  Today she started to have pain around jaw and teeth area.  Has been taking sudafed

## 2024-03-03 ENCOUNTER — Ambulatory Visit: Admitting: Family Medicine

## 2024-03-14 ENCOUNTER — Ambulatory Visit (INDEPENDENT_AMBULATORY_CARE_PROVIDER_SITE_OTHER): Admitting: Family Medicine

## 2024-03-14 ENCOUNTER — Encounter: Payer: Self-pay | Admitting: Family Medicine

## 2024-03-14 VITALS — BP 106/65 | HR 92 | Ht 67.5 in | Wt 161.0 lb

## 2024-03-14 DIAGNOSIS — R110 Nausea: Secondary | ICD-10-CM | POA: Diagnosis not present

## 2024-03-14 NOTE — Assessment & Plan Note (Signed)
 Overall her symptoms have improved.  Continue PPI for an additional 2 weeks then discontinue. If symptoms return recommend checking H. Pylori and/or referral to GI.

## 2024-03-14 NOTE — Progress Notes (Signed)
 Brittney Mays - 38 y.o. female MRN [id number]  Date of birth: 12/27/1985  Subjective Chief Complaint  Patient presents with   Nausea    HPI Brittney Mays is a 38 y.o. female here today for follow up of nausea.    Started on pantoprazole with zofran  as needed.  Overall she is about 75% improved but still has some decrease in appetite as well as intermittent episodes of nausea.  She also feels a little more gassy at times. She has felt a little more constipated recently.   ROS:  A comprehensive ROS was completed and negative except as noted per HPI  Allergies  Allergen Reactions   Amoxicillin Hives   Cephalosporins Hives    Past Medical History:  Diagnosis Date   Chronic back pain    Family history of nonmelanoma skin cancer    Knee pain     Past Surgical History:  Procedure Laterality Date   WISDOM TOOTH EXTRACTION      Social History   Socioeconomic History   Marital status: Married    Spouse name: Not on file   Number of children: 0   Years of education: Not on file   Highest education level: Bachelor's degree (e.g., BA, AB, BS)  Occupational History   Not on file  Tobacco Use   Smoking status: Never   Smokeless tobacco: Never  Vaping Use   Vaping status: Never Used  Substance and Sexual Activity   Alcohol use: Not Currently    Alcohol/week: 2.0 standard drinks of alcohol    Types: 2 Standard drinks or equivalent per week   Drug use: Never   Sexual activity: Yes  Other Topics Concern   Not on file  Social History Narrative   Lives at home with husband   Right handed   Caffeine: 1 cup/day   Social Drivers of Corporate Investment Banker Strain: Low Risk  (02/12/2024)   Overall Financial Resource Strain (CARDIA)    Difficulty of Paying Living Expenses: Not hard at all  Food Insecurity: No Food Insecurity (02/12/2024)   Hunger Vital Sign    Worried About Running Out of Food in the Last Year: Never true    Ran Out of Food in the Last Year:  Never true  Transportation Needs: No Transportation Needs (02/12/2024)   PRAPARE - Administrator, Civil Service (Medical): No    Lack of Transportation (Non-Medical): No  Physical Activity: Insufficiently Active (02/12/2024)   Exercise Vital Sign    Days of Exercise per Week: 3 days    Minutes of Exercise per Session: 20 min  Stress: No Stress Concern Present (02/12/2024)   [name]    Feeling of Stress: Only a little  Social Connections: Moderately Isolated (02/12/2024)   Social Connection and Isolation Panel    Frequency of Communication with Friends and Family: Once a week    Frequency of Social Gatherings with Friends and Family: Once a week    Attends Religious Services: 1 to 4 times per year    Active Member of [employer] or Organizations: No    Attends Engineer, Structural: Not on file    Marital Status: Married    Family History  Problem Relation Age of Onset   Hypertension Mother    Other Mother        acoustic neuroma   Cancer Sister        Gorlin syndrome   Leukemia  Paternal Uncle 20   Depression Maternal Grandmother    Leukemia Maternal Grandmother 57   Other Maternal Grandmother        'forked ribs', ? gorlin syndrome   Diabetes Paternal Grandfather    Other Cousin        [name]   Down syndrome Cousin    Neuropathy Neg Hx     Health Maintenance  Topic Date Due   HPV VACCINES (1 - 3-dose SCDM series) 02/14/2025 (Originally 06/30/2013)   COVID-19 Vaccine (9 - 2025-26 season) 03/02/2025 (Originally 12/31/2023)   Cervical Cancer Screening (HPV/Pap Cotest)  11/05/2026   DTaP/Tdap/Td (9 - Td or Tdap) 02/28/2032   Influenza Vaccine  Completed   Hepatitis C Screening  Completed   HIV Screening  Completed   Pneumococcal Vaccine  Aged Out   Meningococcal B Vaccine  Aged Out      ----------------------------------------------------------------------------------------------------------------------------------------------------------------------------------------------------------------- Physical Exam LMP 02/03/2024 (Approximate)   Physical Exam Constitutional:      Appearance: Normal appearance.  Eyes:     General: No scleral icterus. Neurological:     Mental Status: She is alert.  Psychiatric:        Mood and Affect: Mood normal.        Behavior: Behavior normal.     ------------------------------------------------------------------------------------------------------------------------------------------------------------------------------------------------------------------- Assessment and Plan  Nausea Overall her symptoms have improved.  Continue PPI for an additional 2 weeks then discontinue. If symptoms return recommend checking H. Pylori and/or referral to GI.     No orders of the defined types were placed in this encounter.   No follow-ups on file.
